# Patient Record
Sex: Female | Born: 1935
Health system: Southern US, Community
[De-identification: ages and names within clinical notes are randomized; demographics above are authoritative.]

## PROBLEM LIST (undated history)

## (undated) DIAGNOSIS — E785 Hyperlipidemia, unspecified: Secondary | ICD-10-CM

## (undated) DIAGNOSIS — E119 Type 2 diabetes mellitus without complications: Secondary | ICD-10-CM

## (undated) DIAGNOSIS — I2510 Atherosclerotic heart disease of native coronary artery without angina pectoris: Secondary | ICD-10-CM

## (undated) DIAGNOSIS — I10 Essential (primary) hypertension: Secondary | ICD-10-CM

## (undated) HISTORY — DX: Hyperlipidemia, unspecified: E78.5

## (undated) HISTORY — PX: CATARACT EXTRACTION: SUR2

## (undated) HISTORY — PX: CAROTID ENDARTERECTOMY: SUR193

## (undated) HISTORY — DX: Type 2 diabetes mellitus without complications: E11.9

## (undated) HISTORY — DX: Essential (primary) hypertension: I10

## (undated) HISTORY — DX: Atherosclerotic heart disease of native coronary artery without angina pectoris: I25.10

## (undated) HISTORY — PX: CORONARY ARTERY BYPASS GRAFT: SHX141

---

## 2004-12-06 ENCOUNTER — Inpatient Hospital Stay (HOSPITAL_BASED_OUTPATIENT_CLINIC_OR_DEPARTMENT_OTHER): Admission: RE | Admit: 2004-12-06 | Discharge: 2004-12-06 | Payer: Self-pay | Admitting: *Deleted

## 2004-12-06 ENCOUNTER — Ambulatory Visit: Payer: Self-pay | Admitting: *Deleted

## 2004-12-23 ENCOUNTER — Inpatient Hospital Stay (HOSPITAL_COMMUNITY): Admission: RE | Admit: 2004-12-23 | Discharge: 2004-12-28 | Payer: Self-pay | Admitting: Cardiothoracic Surgery

## 2006-10-13 IMAGING — CR DG CHEST 1V PORT
1 series · 1 of 1 positions shown · non-contrast
Comparison: none

CLINICAL DATA: Status post CABG; coronary artery disease
 CHEST PORTABLE ONE VIEW:
 AP view of the chest dated 12/26/04 is compared with the study of the previous day.  Since the previous study, the central venous sheath has been removed.  There is some interval improvement in aeration; however, small pleural effusion with atelectasis at the left base persists.  No evidence of pneumothorax is noted.

[view not recorded]
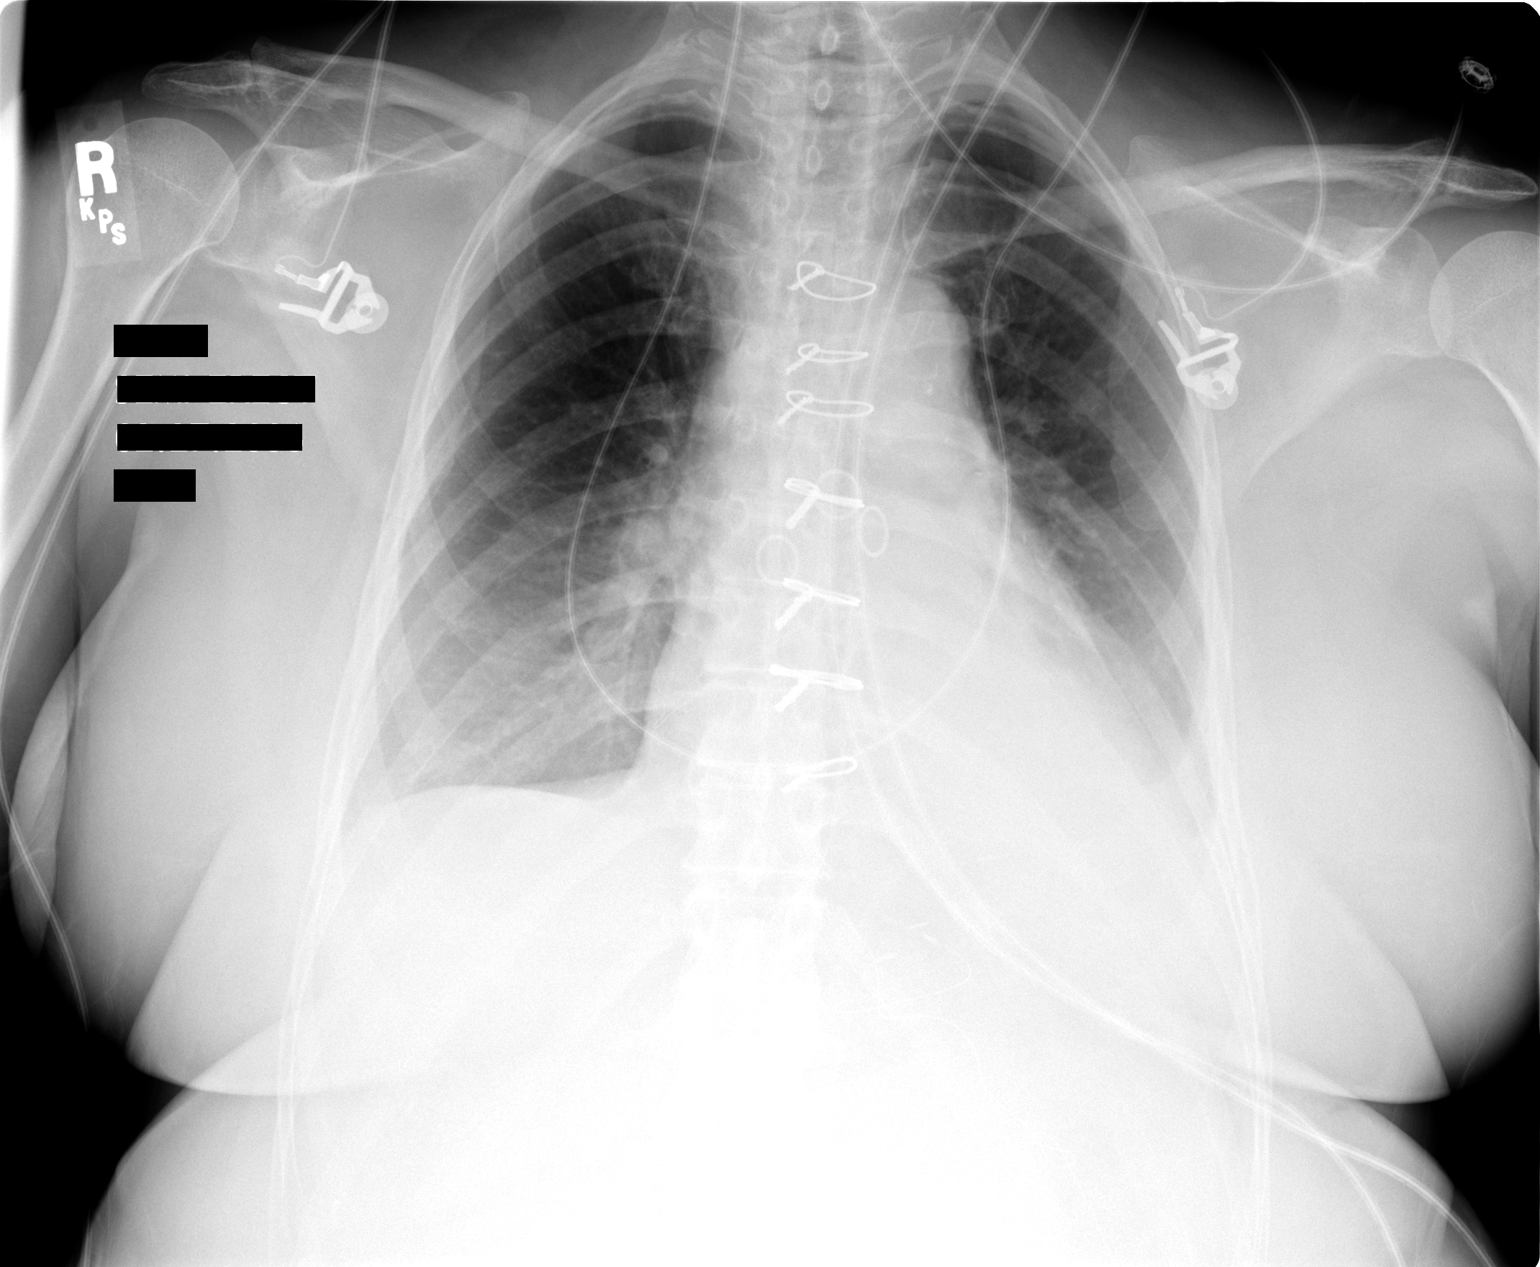

[1 of 1 positions shown; findings below may reference images not displayed]

IMPRESSION: Slight interval improvement; however, there is residual pleural effusion and atelectasis persisting on the left with other discussion as above.

## 2006-10-14 IMAGING — CR DG CHEST 2V
2 series · 2 of 2 positions shown · non-contrast
Comparison: none

CLINICAL DATA: Post-CABG.  Chest soreness. 
 CHEST - TWO VIEW:
 Two views of the chest are compared to a portable chest x-ray from 12/26/04.  Small bilateral pleural effusions remain with basilar atelectasis.  No pneumothorax is seen.  Mild cardiomegaly is stable.  Median sternotomy sutures are noted.

[view not recorded (1 of 2)]
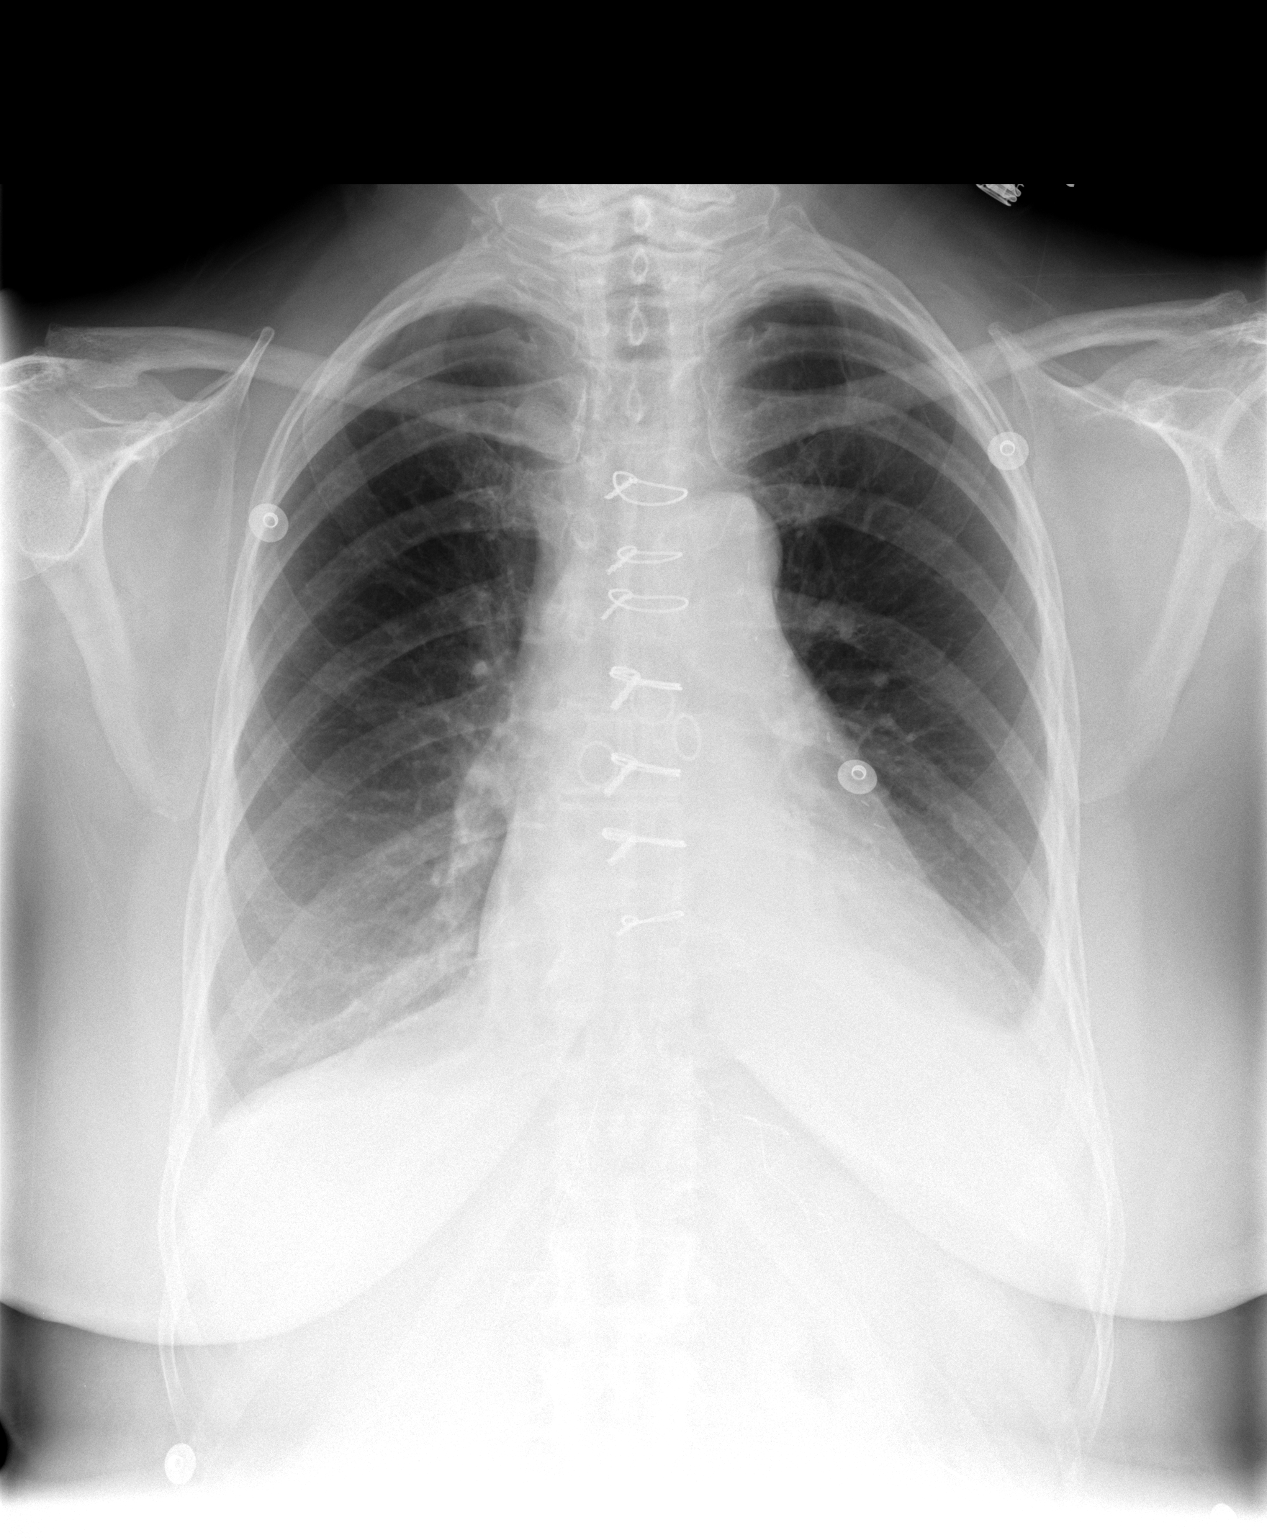

[view not recorded (2 of 2)]
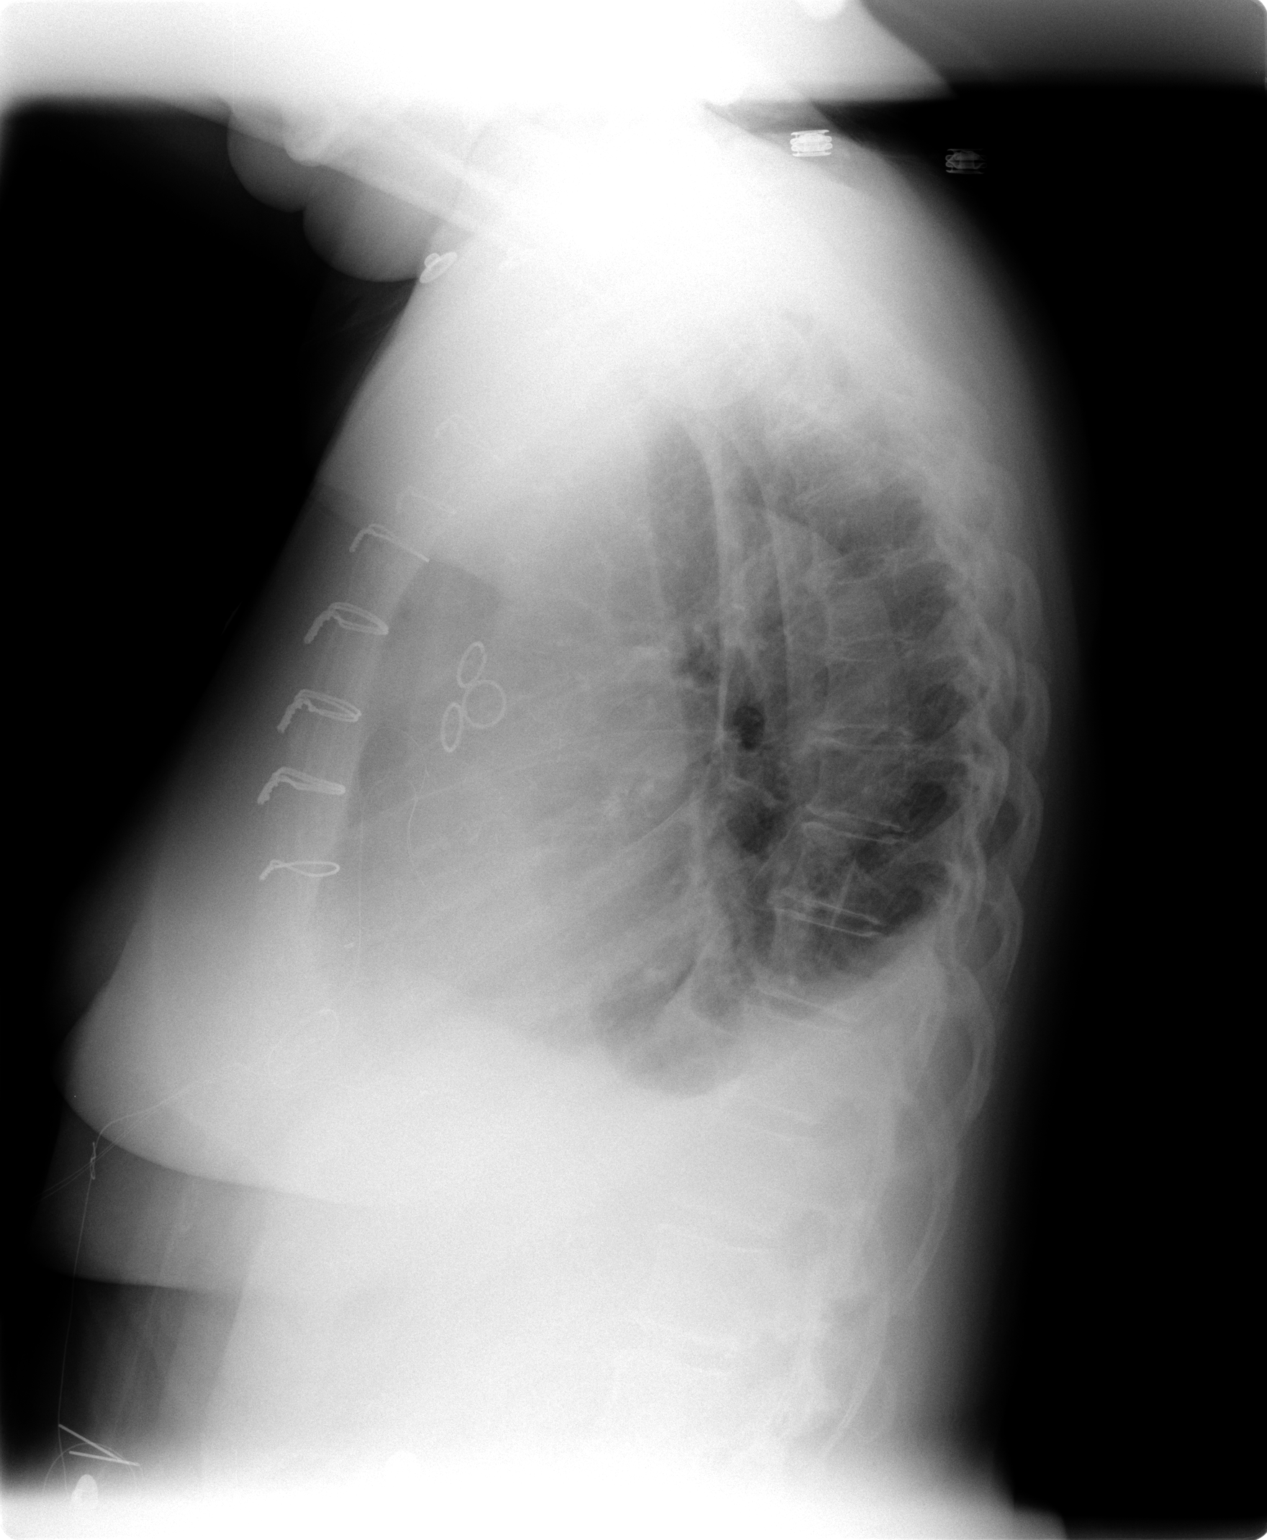

[2 of 2 positions shown; findings below may reference images not displayed]

IMPRESSION: Small bilateral pleural effusions, left greater than right, with basilar atelectasis postoperatively.

## 2007-12-20 ENCOUNTER — Ambulatory Visit (HOSPITAL_COMMUNITY): Admission: RE | Admit: 2007-12-20 | Discharge: 2007-12-20 | Payer: Self-pay | Admitting: Cardiovascular Disease

## 2007-12-20 ENCOUNTER — Ambulatory Visit: Payer: Self-pay | Admitting: Cardiology

## 2010-11-20 ENCOUNTER — Encounter: Payer: Self-pay | Admitting: Cardiothoracic Surgery

## 2011-03-14 NOTE — H&P (Signed)
Angel Gentry, Angel Gentry                 ACCOUNT NO.:  [id number]   MEDICAL RECORD NO.:  [id number]          PATIENT TYPE:  OIB   LOCATION:  2856                         FACILITY:  [facility]   PHYSICIAN:  Arturo Morton. Riley Kill, MD, FACCDATE OF BIRTH:  01/27/1935   DATE OF ADMISSION:  12/20/2007  DATE OF DISCHARGE:                              HISTORY & PHYSICAL   PRIMARY CARDIOLOGIST:  Aundra Dubin. Revankar, M.D. of [facility].   PHYSICIAN PERFORMING CATHETERIZATION:  Arturo Morton. Riley Kill, MD, [facility].   PRIMARY CARE PHYSICIAN:  Dr. Roney Marion.   HISTORY OF PRESENT ILLNESS:  This is a 76 year old Caucasian female with  a known history of CAD, coronary artery bypass grafting of 6 vessels in  February 2006, who was seen by Dr. Tomie China on followup visit secondary  to increased dyspnea on exertion.  The patient underwent a stress test  and had 3-mm horizontal ST inferolateral depression with termination of  test secondary to dyspnea.  The patient recovered well Her nuclear study  revealed no signs of ischemia.  However, secondary to the abnormality of  the stress test, it has been advised by Dr. Tomie China that the patient  have a cardiac catheterization with her known history.  This was  discussed with Dr. Riley Kill by phone by Dr. Tomie China and cardiac  catheterization was planned.   REVIEW OF SYSTEMS:  The patient had complaints of dyspnea on exertion  and shortness of breath.  She also complains of pounding in her ears at  nighttime which is disturbing and difficult to allow her to sleep at  night.  Otherwise, she denies any chest pain.  She denies any dizziness,  diaphoresis, nausea or vomiting.  The shortness of breath occurs with  exertion only.  No PND or orthopnea.   PAST MEDICAL HISTORY:  1. Known coronary artery disease.  2. Dyslipidemia.  3. Essential hypertension.  4. Diabetes mellitus, type 2.  5. Hypothyroidism.   PAST SURGICAL HISTORY:  1. Coronary artery  bypass grafts, 6 vessels.  2. Breast biopsy.  3. D&C.   SOCIAL HISTORY:  She lives near [place] with her husband.  She does not  smoke, does not drink alcohol.   FAMILY HISTORY:  Not significant secondary to age and diagnoses.   CURRENT MEDICATIONS:  1. Actos 15 mg once a day.  2. Simvastatin 40 mg daily.  3. Levothyroxine 88 mcg once a day.  4. Metoprolol 50 mg, one and one half tablets daily.  5. HCTZ 25 mg one half tablet daily.  6. Cozaar 50 mg once a day.  7. Fenofibrate 200 mg caps daily.  8. Plavix 75 mg daily.  9. Ecotrin 325 mg daily.  10.Centrum Silver multivitamin once a day.  11.Calcium with vitamin D, once a day.  12.Nitroglycerin p.r.n.   ALLERGIES:  1. SULFA.  2. RELAFEN.  Both cause rash.   CURRENT LABORATORY:  Hemoglobin 12.7, hematocrit 37, white blood cells  5.7, platelets 185.  Sodium 140, potassium 4.1, chloride 104, CO2 27,  BUN 20, creatinine 1, glucose 102.  EKG revealing a normal sinus  rhythm  with T wave flattening inferolateral with no Q waves seen.  EKG rate of  70 beats per minute,   PHYSICAL EXAMINATION:  VITAL SIGNS:  Blood pressure 142/66, pulse 66,  respirations 20, temperature 97.7, O2 sat 98% on room air.  Weight is  70.9 kg.  HEENT:  Head is normocephalic and atraumatic.  Eyes:  PERRLA.  Mucous  membranes of the mouth and pink and moist.  Tongue is midline.  NECK:  Supple.  There is no JVD.  There are no carotid bruits.  CARDIOVASCULAR:  Regular rate and rhythm with high pitched systolic  murmur auscultated without rubs or gallops.  Pulses are 2 plus and equal  bilaterally radially and dorsalis pedis.  LUNGS:  Clear to auscultation without wheezes, rales, or rhonchi.  ABDOMEN:  Soft, nontender, with 2 plus bowel sounds.  There are no  abdominal bruits.  No tenderness is noted.  EXTREMITIES:  Without clubbing, cyanosis, or edema.  No femoral bruits  are appreciated.  NEUROLOGIC:  Cranial nerves II-XII are grossly intact.    IMPRESSION:  1. Coronary artery disease, status post 6-vessel coronary artery      bypass grafting in 2006.      a.     Recurrent dyspnea on exertion.      b.     Abnormal stress test with inferolateral ST depression of 3-       mm, per Dr. Tomie China.  2. Dyslipidemia.  3. Diabetes.  4. Hypothyroidism.   PLAN:  This is a 76 year old Caucasian female with known history of  coronary artery disease and coronary artery bypass grafting who was seen  by primary cardiologist, Dr. Tomie China in [place] and underwent  Cardiolite stress test revealing abnormal EKG with 3-mm horizontal ST  inferolateral depressions with associated dyspnea.  The  patient is here for cardiac catheterization and re-evaluation of known  coronary artery disease and coronary artery grafts per Dr. Riley Kill.  The  patient appears stable at present and is without pain or shortness of  breath.  She will undergo cardiac catheterization with recommendations  and intervention at Dr. Rosalyn Charters discretion.      Bettey Mare. Lyman Bishop, NP      Arturo Morton. Riley Kill, MD, [facility]  Electronically Signed    KML/MEDQ  D:  12/20/2007  T:  12/20/2007  Job:  [id number]   cc:   Aundra Dubin. Revankar, M.D.  Roney Marion, Dr.

## 2011-03-14 NOTE — Cardiovascular Report (Signed)
Angel Gentry, Angel Gentry                 ACCOUNT NO.:  [id number]   MEDICAL RECORD NO.:  [id number]          PATIENT TYPE:  OIB   LOCATION:  2856                         FACILITY:  [facility]   PHYSICIAN:  Arturo Morton. Riley Kill, MD, FACCDATE OF BIRTH:  May 19, 1935   DATE OF PROCEDURE:  DATE OF DISCHARGE:                            CARDIAC CATHETERIZATION   INDICATIONS:  Angel Gentry is a delightful 76 year old who has undergone  revascularization surgery.  She had follow-up with Dr. Tomie China.  She  had a normal myocardial perfusion study, but significant ST depression  that persisted.  Because of her diabetic status, she was referred for  repeat cardiac catheterization.   PROCEDURE:  1. Left heart catheterization  2. Selective coronary arteriography.  3. Selective left ventriculography.  4. Saphenous vein graft angiography x3.  5. Selective left internal mammary angiography.   DESCRIPTION OF PROCEDURE:  The patient was brought to the cath lab and  prepped and draped in usual fashion.  Through an anterior puncture, the  right femoral artery was easily entered.  The 5-French sheath was  placed.  Views of the left and right coronary arteries were obtained.  All three vein grafts were injected.  Internal mammary was injected.  Central aortic and left ventricular pressures were measured with a  pigtail. Ventriculography was then performed in the RAO projection.  There were no complications.  She tolerated the procedure well and was  taken to the holding area in satisfactory clinical condition.  I took  pictures to her family, and reviewed the findings with her husband.   She was taken then to the day cath area for convalescence.   HEMODYNAMIC DATA:  1. Central aortic pressure 155/67, mean 84.  2. Left ventricular pressure 153/90.  3. On pullback across the aortic valve, there is a fair amount of      ectopy making the pullback unreliable.  On initial crossing, there      did not appear to be a  significant gradient.   ANGIOGRAPHIC DATA:  1. Ventriculography in the RAO projection reveals vigorous global      systolic function without segmental wall motion abnormality.      Ejection fraction is in excess of 65%.  There was ventricular      ectopy, and as a result there was a trace mitral regurgitation,      although on the one sinus beat, there did not appear to be      significant mitral regurgitation.  2. The left main is free of critical disease.  3. The LAD is essentially occluded.  4. The right circumflex artery is occluded.  5. Flow into the diagonal is slow and competitive.  6. The right coronary artery is subtotally occluded in this      midportion.  7. The internal mammary to the distal LAD does appear to be intact.      There is mild irregularity in the internal mammary, but no areas of      high-grade obstruction.  8. The saphenous vein graft to the distal circumflex demonstrates what  appears to be likely an occlusion of the OM insertion.  The OM      itself is probably diffusely diseased and small in caliber.  The      vein graft continues smoothly to the distal circumflex which it      fills nicely.  The distal circumflex then retrograde fills the OM.  9. Saphenous vein graft to the diagonal in multiple views appears to      have a stable insertion site.  It actually was implanted into a      smaller sub-branch, but fills the diagonal system well.  10.A saphenous vein graft to the distal right coronary/posterior      descending artery appears to be intact.  Both the PLA, and the PDA      fill nicely.  The PDA and is seen proximally and is occluded before      the graft insertion, when filling through the native vessel from      the proximal graft.   CONCLUSIONS:  1. Well-preserved left ventricular function.  2. Continued patency of the internal mammary to the LAD.  3. Continued patency of saphenous vein graft to the diagonal.  4. Continued patency of the  saphenous vein graft to the distal right      coronary and posterior descending branch.  5. Continued patency of the saphenous vein graft to the distal      circumflex, which retrograde fills the obtuse marginal.   DISPOSITION:  At the present time, the patient will follow up with Dr.  Tomie China.  She likely can continue to be treated medically.  Overall,  her vein grafts appear well preserved.  The EKG change without perfusion  defect could be from ischemia in the OM territory, which fills  retrograde.  Per Dr. Tomie China, we will discontinue her Plavix at the  present time.      Arturo Morton. Riley Kill, MD, [facility]  Electronically Signed     TDS/MEDQ  D:  12/20/2007  T:  12/21/2007  Job:  [id number]   cc:   Aundra Dubin. Revankar, M.D.  Patient Medical Record

## 2011-03-17 NOTE — Consult Note (Signed)
NAMEBRADIE, Angel Gentry                 ACCOUNT NO.:  [id number]   MEDICAL RECORD NO.:  [id number]          PATIENT TYPE:  INP   LOCATION:                               FACILITY:  [facility]   PHYSICIAN:  Sheliah Plane, MD         DATE OF BIRTH:   DATE OF CONSULTATION:  12/15/2004  DATE OF DISCHARGE:  Oct 03, 1935                                   CONSULTATION   OFFICE CONSULTATION AND HISTORY AND PHYSICAL:   FOLLOW-UP CARDIOLOGIST:  Glean Hess R. Revankar, M.D.   PRIMARY CARE PHYSICIAN:  Dr. Lawana Pai.   REASON FOR CONSULTATION:  Coronary artery disease.   HISTORY OF PRESENT ILLNESS:  The patient is a 76 year old female who  headache a known history of coronary occlusive disease first discovered at  cardiac catheterization in 1994 after an abnormal stress test.  The patient notes that she has chest tightness almost all the time feeling  like a knot in her chest that is not exertionally related;  however, she  does have exertionally-related shortness of breath which she notes has  increased slightly over the past several months.  It is mostly noted when  she climbs a hill or stairs, but because of these symptoms in the light of  the patient with diabetes and known occlusive disease from previous cardiac  catheterization, a stress test was performed on December 06, 2004, which  showed reversible ischemia in the inferior wall and septum.  Ejection  fraction was normal.  She was noted to have poor exercise tolerance.  She  was evaluated by Dr. Tomie China and referred for cardiac catheterization which  was done by Dr. Gerri Spore on December 06, 2004.   The patient denies any resting symptoms.  Cardiac risk factors include  hypertension x20 years, hyperlipidemia x20 years, diabetes for at least one  year.  She does not know her recent hemoglobin A1c.  She is not a smoker,  has a positive family history, and her father head myocardial infarctions  x2.  Also, her sister died at 45 of cancer, has a brother  with heart disease  and diabetes who is still alive.   PAST MEDICAL HISTORY:  In addition to the above is significant for  hypothyroidism.   PAST SURGICAL HISTORY:  Breast biopsies x2 in 1983 and 1986, D&Cs 1998 and  1986, cyst removed from the scalp, heart catheterization in 1994 and again  February of 2006.   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  She does not drink alcohol.   MEDICATIONS:  1.  Actos 15 mg a day.  2.  Potassium 20 mEq three times a day.  3.  Isosorbide 20 mg twice a day.  4.  Triamterene/hydrochlorothiazide 75/50 once a day.  5.  Lipitor 20 mg once a day.  6.  Plendil 5 mg once a day.  7.  Nadolol 40 mg a day.  8.  Levoxyl 88 mcg a day.  9.  Prilosec once a day.  10. Ecotrin 325 mg a day.  11  Calcium  3 a day.  1.  Recently added Nitroquick and alprazolam p.r.n.   DRUG SENSITIVITIES/ALLERGIES:  SULFA which causes a rash and RELAFEN which  causes a rash.   CARDIAC REVIEW OF SYSTEMS:  Positive for chest pain, lower extremity edema,  exertional shortness of breath, denies resting shortness of breath,  palpitations, syncope, orthopnea, or presyncope.   GENERAL REVIEW OF SYSTEMS:  The patient denies any constitutional symptoms,  denies fever or weight loss.  RESPIRATORY:  Mild shortness of breath with  exertion, no hemoptysis.  GASTROINTESTINAL:  Denies any change in bowel  habits, denies any blood in her stool or urine.  NEUROLOGIC:  Denies  amaurosis or TIAs.  MUSCULOSKELETAL:  Denies any joint discomfort or  arthritis.  GENITOURINARY:  Denies hematuria, denies urinary tract  infections.  HEMATOLOGIC:  Denies easy bruisability.  ENDOCRINE.  History of  hypothyroidism.  Denies psychiatric history.  HEENT:  Wears glasses.   PHYSICAL EXAMINATION:  VITAL SIGNS:  Blood pressure 130/72, pulse 75,  respiratory rate 18, O2 saturation 97%.  GENERAL:  The patient is awake and alert, neurologically intact, able to  relate  her history without difficulty.  NECK:  She has no jugular venous distention.  She has no carotid bruits.  LUNGS:  Clear bilaterally.  CARDIAC EXAM:  Regular rate and rhythm without murmur or gallop.  ABDOMINAL EXAM:  Benign with palpable masses or palpable enlargement of the  aorta.  LOWER EXTREMITIES:  With intact saphenous vein at both ankles without  significant edema.   Cardiac catheterization performed on December 06, 2004 by Dr. Gerri Spore:  There is no valve gradient.  Ejection fraction is greater than 60% but with  ectopy.  There is mild mitral regurgitation.  The left main has an  approximately 20% stenosis.  The LAD had a complex bifurcation.  The  diagonal has a 90% lesion.  The circumflex has two normal size obtuse  marginals and an 80% stenosis in the mid-circumflex.  The right coronary  artery is dominant with a long 70-80% stenosis, 75% proximal posterior  descending stenosis.   After reviewing the films and the patient's history, I agree with Dr.  Clarita Crane recommendation to proceed with coronary artery bypass grafting.  The patient's vessels are small, but she has diffuse three-vessel disease,  and I have explained to her that she would most likely benefit from bypass  surgery with the high-grade three-vessel coronary  artery disease.  The risks of the surgery including death, infection,  stroke, myocardial infarction, bleeding, blood transfusion were all  discussed with the patient in detail.  She is willing to proceed.  We will  tentatively make arrangements for surgery on December 22, 2004.      EG/MEDQ  D:  12/15/2004  T:  12/15/2004  Job:  [id number]   cc:   Aundra Dubin. Tomie China, M.D.  [address]  [place]  [place] [address]  Fax: [phone]   Carole Binning, M.D. [facility]

## 2011-03-17 NOTE — Cardiovascular Report (Signed)
NAMEITZEL, MCKIBBIN                 ACCOUNT NO.:  [id number]   MEDICAL RECORD NO.:  [id number]          PATIENT TYPE:  OIB   LOCATION:  6501                         FACILITY:  [facility]   PHYSICIAN:  Carole Binning, M.D. LHCDATE OF BIRTH:  [birth year]   DATE OF PROCEDURE:  12/06/2004  DATE OF DISCHARGE:                              CARDIAC CATHETERIZATION   PROCEDURE PERFORMED:  Left heart catheterization with coronary angiography  and left ventriculography.   INDICATIONS:  Ms. Thorstenson is a 76 year old woman with multiple cardiac risk  factors including diabetes, dyslipidemia, and hypertension.  She has had  symptoms of progressive exertional dyspnea and occasional chest pain.  A  stress nuclear scan performed in [place] was interpreted as revealing  inferior ischemia.  She was therefore referred for cardiac catheterization.   PROCEDURE NOTE:  A 4-French sheath was placed in the right femoral artery.  Coronary angiography was performed with standard Judkins 4-French catheters.  Left ventriculography was performed with an angled pigtail catheter.  Contrast was Omnipaque.  There are no complications.   RESULTS:   HEMODYNAMICS:  1.  Left ventricular pressure 136/10.  2.  Aortic pressure 140/62.  There is no aortic valve gradient.   LEFT VENTRICULOGRAM:  Wall motion is normal.  Ejection fraction estimated at  greater than 60%.  It could not be calculated precisely due to ventricular  ectopy.  There is mild mitral regurgitation present which appears to be  secondary to ventricular ectopy.   CORONARY ARTERIOGRAPHY:  (There is moderate calcification of the coronary  arteries, particularly the right coronary artery).  Left main has a diffuse 20% stenosis.  Left anterior descending artery has complex bifurcational disease with a 90%  stenosis in the proximal vessel extending across the origin of a large first  diagonal branch.  Further down in the mid vessel, there is a tubular 70%  stenosis.  The first diagonal itself has an 80% stenosis at the ostium  followed by a short area of ectasia followed by a 50% stenosis.  This is a  large bifurcating vessel.  Left circumflex gives rise to two normal-sized obtuse marginal branches.  There is an 80% stenosis in the mid left circumflex.  Right coronary is a large dominant vessel.  In the mid vessel there is a  long 80% stenosis extending to the acute margin of the vessel.  The distal  right coronary artery gives rise to a large posterior descending artery and  a large posterolateral branch.  In the posterior descending artery, there is  a diffuse 75% stenosis proximally.   IMPRESSIONS:  1.  Normal left ventricular systolic function.  2.  Complex significant three-vessel coronary artery disease.   PLAN:  The patient will be referred for coronary bypass surgery.      MWP/MEDQ  D:  12/06/2004  T:  12/06/2004  Job:  [id number]   cc:   Dr. Nelia Shi  [facility] R. Revankar, M.D.  [address]  [place]  [place] [address]  Fax: [phone]   Cardiac Cath Lab

## 2011-03-17 NOTE — Cardiovascular Report (Signed)
[name], Angel Gentry                 ACCOUNT NO.:  [id number]   MEDICAL RECORD NO.:  [id number]          PATIENT TYPE:  OIB   LOCATION:  6501                         FACILITY:  [facility]   PHYSICIAN:  Carole Binning, M.D. LHCDATE OF BIRTH:  1935-02-09   DATE OF PROCEDURE:  DATE OF DISCHARGE:                              CARDIAC CATHETERIZATION   Audio too short to transcribe (less than 5 seconds)      MWP/MEDQ  D:  12/06/2004  T:  12/06/2004  Job:  [id number]

## 2011-03-17 NOTE — Discharge Summary (Signed)
Angel Gentry, Angel Gentry                 ACCOUNT NO.:  [id number]   MEDICAL RECORD NO.:  [id number]          PATIENT TYPE:  INP   LOCATION:  2002                         FACILITY:  [facility]   PHYSICIAN:  Sheliah Plane, MD    DATE OF BIRTH:  03-11-1935   DATE OF ADMISSION:  12/23/2004  DATE OF DISCHARGE:  12/27/2004                                 DISCHARGE SUMMARY   ADMISSION DIAGNOSIS:  Coronary artery disease.   PAST MEDICAL HISTORY AND DISCHARGE DIAGNOSES:  1.  Hypertension.  2.  Hyperlipidemia.  3.  Diabetes mellitus type 2.  4.  Hypothyroidism.  5.  Status post breast biopsies in 1983 and 1986.  6.  Status post dilatation and curettage in 1986 and 1998.  7.  Status post cyst removal from the scalp.  8.  Coronary artery disease status post heart catheterizations in 1994, and      in February 2006 status post coronary artery bypass grafting x6.  9.  Postoperative anemia.   ALLERGIES:  1.  SULFA WHICH CAUSES A RASH.  2.  RELAFEN WHICH CAUSES A RASH.   BRIEF HISTORY:  The patient is a 76 year old female with a known history of  coronary occlusive disease that was first discovered at cardiac cath in 1994  after an abnormal stress test.  The patient noted that she experienced chest  tightness almost all the time that felt like a knot in her chest, it was not  exertionally related, however she did experience dyspnea on exertion which  she noted to increase over the past several months prior to admission.  The  patient was taken for a repeat cardiac catheterization on December 06, 2004  which showed reversible ischemia in the inferior wall and septum with a  normal ejection fraction.  She was noted to have poor exercise tolerance and  was therefore referred from Dr. Tomie China to Dr. Gerri Spore for cardiac  catheterization which was also performed on December 06, 2004.  This revealed  severe three-vessel coronary artery disease.  She was then evaluated by Dr.  Sheliah Plane regarding  surgical revascularization.  After review of the  patient and her films it was his recommendation that the patient proceed  with coronary artery bypass graft surgery.   HOSPITAL COURSE:  The patient was admitted and taken to the OR on December 23, 2004 for coronary artery bypass grafting x6.  The left internal mammary  artery was grafted to the LAD, saphenous vein as grafted to the diagonal,  saphenous vein was grafted in sequence to the OM1 and OM2, and saphenous  vein was grafted in sequence from the RCA to the distal PDA.  Endoscopic  vessel harvesting was performed on the right lower extremity.  The patient  tolerated the procedure well and was hemodynamically stable immediately  postoperatively.  The patient was extubated without complication and woke up  from anesthesia neurologically intact.  The patient was transferred from the  OR to the SICU in stable condition.   The patients postoperative course has progressed as expected.  On postop day  one she was  without complaint.  She was afebrile with stable vital signs and  was A paced at 84.  Her physical exam was within normal limits.  Invasive  lines and chest tubes were discontinued in a routine manner and she  tolerated this well.  The patient began ambulation with cardiac rehab on  postoperative day three and she has increased her tolerance to a  satisfactory manner at this time.   On postoperative day four the patient was noted to be anemic with a  hemoglobin of 8.9 and hematocrit 25.8.  She was asymptomatic and iron and  folic acid were added to her medications.  This is currently being  monitored.  Also on postoperative day four the patient was again without  complaint.  She was afebrile with stable vital signs including a blood  pressure of 106/59, heart rate 88, and O2 saturation 96% on room air.  The  patient was maintaining a normal sinus rhythm with occasional PACs.  She  continues to be volume overloaded and has been  diuresed accordingly  postoperatively and will continue to be diuresed as an outpatient.  On  physical exam cardiac is regular rate and rhythm.  The lungs reveal  decreased breath sounds in the bilateral bases.  The abdomen is benign.  The  incisions are clean, dry and intact and there is 1+ pitting edema present in  the right lower extremity.  The patients diabetes mellitus has been  controlled quite well postoperatively.  She has been maintained on her home  dose of Actos and was given Lantus as well as sliding-scale insulin as  needed.  The patient will be discharged on her home dose of Actos and will  be instructed to contact her primary care physician for close followup of  her blood sugars after her return home.  The patient is in stable condition  at this time, and she will be ready for discharge within the next 1-2 days,  pending morning round reevaluation.   LABS:  CBC and BMP on December 27, 2004 -- white count 7.6, hemoglobin 8.9,  hematocrit 25.8, platelets 135, sodium 135, potassium 3.8, BUN 13,  creatinine 0.7, glucose 87.  CBGs on December 27, 2004 -- 139 and 145.  Hemoglobin A1c on December 21, 2004 -- 6.1.   CONDITION ON DISCHARGE:  Improved.   DISCHARGE MEDICATIONS:  1.  Aspirin 325 mg p.o. daily.  2.  Toprol XL 50 mg daily.  3.  Lipitor 20 mg daily.  4.  Niferex 150 mg daily.  5.  Folic acid 1 mg daily.  6.  Actos 15 mg daily.  7.  Levoxyl 88 mcg daily.  8.  Lasix 20 mg daily x7 days.  9.  K-Dur 20 mEq daily x7 days.  10. Tylenol 1-2 p.o. q.4-6 hours p.r.n. pain.   ACTIVITY:  No driving, no lifting more than 10 pounds, and the patient is to  continue daily breathing and walking exercises.   DIET:  Low salt, low fat and carbohydrate modified, medium calorie.   WOUND CARE:  The patient may shower daily and clean her incisions with soap  and water.  If wound problems arise, the patient is to contact the [facility]  office.  FOLLOWUP APPOINTMENT:  1.  Dr. Tomie China.   The patient should contact his office for an appointment      two weeks after discharge.  2.  Dr. Tyrone Sage January 19, 2005 at 12:20 p.m.  3.  The patient should go to [facility] on January 19, 2005      at 11:20 a.m. to have a chest x-ray      taken which she will bring with her to the appointment with Dr.      Tyrone Sage.  4.  Dr. Rutha Bouchard.  The patient will be instructed to contact his office for a      followup appointment regarding her diabetes and any medication      adjustments that are necessary after her discharge.      AY/MEDQ  D:  12/27/2004  T:  12/27/2004  Job:  [id number]   cc:   Sheliah Plane, MD  [address]  [place]  [place] [address]  Email: [email or web address]   Rajan R. Revankar, M.D.  [address]  [place]  [place] [address]  Fax: [phone]   Rutha Bouchard, MD

## 2011-03-17 NOTE — Op Note (Signed)
Angel Gentry, Angel Gentry                 ACCOUNT NO.:  [id number]   MEDICAL RECORD NO.:  [id number]          PATIENT TYPE:  INP   LOCATION:  2304                         FACILITY:  [facility]   PHYSICIAN:  Sheliah Plane, MD    DATE OF BIRTH:  Aug 09, 1935   DATE OF PROCEDURE:  12/23/2004  DATE OF DISCHARGE:                                 OPERATIVE REPORT   PREOPERATIVE DIAGNOSIS:  Coronary occlusive disease.   POSTOPERATIVE DIAGNOSIS:  Coronary occlusive disease.   SURGICAL PROCEDURE:  Coronary artery bypass grafting x6 with a left internal  mammary artery to the left anterior descending coronary artery, sequential  reverse saphenous vein graft to the obtuse marginal 1 and circumflex,  sequential reverse saphenous vein graft to the distal right coronary artery  and distal posterior descending coronary artery with right thigh and right  calf Endovein harvesting.   SURGEON:  Sheliah Plane, M.D.   FIRST ASSISTANT:  Evelene Croon, M.D.   SECOND ASSISTANT:  Pecola Leisure, P.A.   BRIEF HISTORY:  The patient is a 76 year old diabetic female who presented  with increasing anginal symptoms.  She was evaluated by Carole Binning,  M.D., and cardiac catheterization was performed.  This demonstrated a  complex 80% lesion of the LAD and diagonal, ostial circumflex lesion of 60-  70%, 90% _distal circumflex lesion, 70-80% mid-right lesion, 70-80% proximal  posterior descending lesion.  Overall, ventricular function was preserved.  Because of the patient's significant three-vessel coronary artery disease  and diabetic status, coronary artery bypass grafting was recommended to the  patient tho agreed and signed informed consent.   DESCRIPTION OF THE PROCEDURE:  With Swan-Ganz and arterial line monitors in  place, the patient underwent general endotracheal anesthesia without  incident.  The skin of the chest and groins was prepped with Betadine and  draped in the usual sterile manner.  Using the  [employer] Endovein harvesting  system, vein was harvested from the right thigh into the mid-portion of the  right calf.  The vein was of good quality and caliber.  A median sternotomy  was performed.  The left internal mammary artery was dissected down as a  pedicle graft.  The distal artery was divided, and it had good free flow.  The pericardium was opened.  Overall, ventricular function appeared  preserved.  The patient was systemically heparinized.  The ascending aorta  and the right atrium were cannulated.  An aortic root vent cardioplegia  needle was introduced into the ascending aorta.  The patient was placed on  cardiopulmonary bypass at 2.4/LPM per meter squared.  Sites of anastomosis  were selected and dissected out of the epicardium.  It should be noted that  the patient's distal vessels were uniformly small, especially the LAD,  diagonal, OM1, and distal circumflex.  The distal right was a slightly  larger vessel.  The posterior descending was small.  The patient's body  temperature was cooled to 30 degrees.  The aortic cross-clamp was applied,  and 500 cc of cold blood cardioplegia was administered with rapid diastolic  arrest of the heart.  Myocardial  septal temperature was monitored throughout  the cross-clamp.  Attention was turned first to the distal right coronary  artery which was opened and admitted a 1.5-mm probe. There was some distal  thickening of the vessel.  Using a longitudinal side-to-side anastomosis,  this was carried out with a segment of reverse saphenous vein graft.  The  distal extent of the same vein was then carried to the mid-portion of the  posterior descending coronary artery.  A 1.5-mm probe passed distally but  not proximally where the proximal posterior descending coronary artery was  stenotic.  Using a running 7-0 Prolene, a distal anastomosis was performed.  Attention was then turned to the left lateral aspect of the heart.  The  first obtuse  marginal and distal circumflex were both intramyocardial  vessels.  The first obtuse marginal was located.  It was a small vessel over  1 mm in size.  Using a diamond-type side-to-side, anastomosis was carried  out with a running 8-0 Prolene.  The distal extent of the same vein was then  carried onto the also small distal circumflex vessel just slightly over 1 mm  in size.  Using a running 8-0 Prolene, a distal anastomosis was performed.  Additional cold blood cardioplegia was administered down the vein grafts.  Attention was then turned to the diagonal coronary artery which was opened.  It was slightly larger than the circumflex vessels, approximately 1.3 mm in  size.  Using a running 7-0 Prolene, a distal anastomosis was performed with  a segment of reverse saphenous vein graft.  Attention was then turned to the  mid-left anterior descending coronary artery.  This vessel was also small  only admitting a 1-mm probe distally.  Using a running 8-0 Prolene, the left  internal mammary artery was anastomosed to the left anterior descending  coronary artery.  With release of Edwards Bulldog on the mammary artery,  there was appropriate rise in myocardial septal temperature.  The aortic  cross-clamp was removed with a total cross-clamp time of 87 minutes.  The  patient required electrodefibrillation returning to a sinus rhythm.  Partial  occlusion clamp was placed on the ascending aorta.  Three punch aortotomies  were performed, and each of the three vein grafts were anastomosed to the  ascending aorta.  Air was evacuated from the grafts, and the partial  occlusion clamp was removed.  The sites of the anastomoses were inspected  and were free of bleeding.  The patient was then ventilated and weaned from  cardiopulmonary bypass without difficulty.  She remained hemodynamically  stable and was decannulated in the usual fashion.  Protamine sulfate was administered.  With the operative field  hemostatic, two atrial and two  ventricular pacing wires were applied.  Graft markers were applied.  A left  pleural tube and two mediastinal tubes were left in place.  The sternum was  closed with #6 stainless steel wire, fascia closed with interrupted 0  Vicryl, running 4-0 Vicryl in the subcutaneous tissue, and 4-0 subcuticular  stitch in the skin edges.  Dressings were applied.  Sponge and needle count  was reported as correct.  Total pump time was 154 minutes.      EG/MEDQ  D:  12/24/2004  T:  12/25/2004  Job:  [id number]   cc:   Carole Binning, M.D. [facility]

## 2011-03-17 NOTE — H&P (Signed)
Angel Gentry, Angel Gentry                 ACCOUNT NO.:  [id number]   MEDICAL RECORD NO.:  [id number]          PATIENT TYPE:  OIB   LOCATION:  6501                         FACILITY:  [facility]   PHYSICIAN:  Carole Binning, M.D. LHCDATE OF BIRTH:  03/06/1935   DATE OF ADMISSION:  12/06/2004  DATE OF DISCHARGE:                                HISTORY & PHYSICAL   PRIMARY CARE PHYSICIAN:  Dr. Lawana Pai.   PRIMARY CARDIOLOGIST:  Aundra Dubin. Revankar, M.D.   CHIEF COMPLAINT:  1.  Angina.  2.  Shortness of breath.  3.  Abnormal Cardiolite.   HISTORY OF PRESENT ILLNESS:  Angel Gentry is a 76 year old female with a  history of nonobstructive coronary artery disease by catheterization in  1994.  Angel Gentry was evaluated by Dr. Lawana Pai for shortness of breath on  exertion.  Dr. Lawana Pai arranged a stress test which was performed on December 01, 2004.  It showed a reversible defect in the inferior wall and septum,  which may be due to ischemia.  Her EF was normal.  She was noted to have  poor exercise tolerance.  She was referred to Dr. Tomie China, who evaluated  her and felt that cardiac catheterization was indicated.  She is here today  for the procedure.   Angel Gentry reports no resting symptoms.  She occasionally experience chest  tightness when she is under stress.  This is not the same as the shortness  of breath she experiences when she walking up hills or up steps.  The  shortness of breath is not associated with any chest pain and it resolves  with rest.  She is not having that today.   PAST MEDICAL HISTORY:  1.  Status post catheterization in 1994 with nonobstructive disease.  2.  Hypertension.  3.  Diabetes.  4.  Dyslipidemia.  5.  Hypothyroidism.   PAST SURGICAL HISTORY:  1.  Cardiac catheterization.  2.  Two breast biopsies.  3.  Two D&Cs.  4.  Scalp cyst removed.   FAMILY HISTORY:  Her mother died at age 31 with a TIA and her father died at  age 9 with an MI and CHF.  She has a brother,  age 57, who has had an MI.   SOCIAL HISTORY:  She is married and lives in [place], [place], with  her husband.  She has no history of smoking, alcohol or drug use.  She works  in the [employer] and says that her job has periods of  high stress.   ALLERGIES:  1.  SULFA.  2.  RELAFEN.   MEDICATIONS:  1.  Lipitor 20 mg q.h.s.  2.  Plendil 5 mg daily.  3.  K-Dur 20 mEq t.i.d.  4.  Maxzide daily.  5.  Isosorbide 20 mg b.i.d.  6.  Ecotrin daily.  7.  Actos 15 mg daily.  8.  Nadolol 40 mg daily.  9.  Levoxyl 88 mcg daily.  10. Prilosec OTC daily.  11. Calcium with vitamin D three tablets daily.   REVIEW OF SYSTEMS:  Significant for  dyspnea on exertion as described above.  She has not had any melena and no hemoptysis or hematemesis.  Her reflux  symptoms are well controlled on the Prilosec.  She has had no recent fever  or chills.  The review of systems is otherwise negative.   PHYSICAL EXAMINATION:  VITAL SIGNS:  She is afebrile.  Blood pressure  126/63, heart rate 62, respiratory rate 18, O2 saturation 97% on room air.  GENERAL APPEARANCE:  She is a well-developed, well-nourished, middle-aged,  white female in no acute distress.  HEENT:  Her head is normocephalic and atraumatic.  The pupils are equal,  round and reactive to light and accommodation.  Extraocular movements are  intact.  Sclerae are clear.  NECK:  There is no JVD, no thyromegaly and no carotid bruits are  appreciated.  CARDIOVASCULAR:  Her heart is regular in rate and rhythm with no significant  murmur, rub or gallop appreciated.  CHEST:  Clear to auscultation bilaterally.  ABDOMEN:  Soft and nontender with active bowel sounds.  No  hepatosplenomegaly by palpation.  SKIN:  No rashes or lesions are noted.  EXTREMITIES:  There is no cyanosis, clubbing or edema.  She has 2+ distal  pulses in all four extremities.  No femoral bruits are appreciated.  MUSCULOSKELETAL:  There is no joint  deformity or effusions noted.  NEUROLOGIC:  She is alert and oriented.  Cranial nerves II-XII grossly  intact.   ASSESSMENT AND PLAN:  1.  Abnormal Cardiolite:  The patient is here today for cardiac      catheterization.  Further evaluation and      intervention will depend on the results of the catheterization.  2.  The patient is otherwise stable.  Will be continued on her home      medications.   Dr. Gerri Spore saw the patient and determined the plan of care.      RB/MEDQ  D:  12/06/2004  T:  12/06/2004  Job:  [id number]

## 2011-07-21 LAB — BASIC METABOLIC PANEL
BUN: 21
CO2: 24
Calcium: 9.8
Chloride: 102
Creatinine, Ser: 1.05
GFR calc Af Amer: >60

## 2011-07-21 LAB — CBC
MCHC: 34.2
MCV: 89.7
Platelets: 185
RBC: 4.13
RDW: 13.6

## 2011-11-07 DIAGNOSIS — R05 Cough: Secondary | ICD-10-CM | POA: Diagnosis not present

## 2012-01-03 DIAGNOSIS — M899 Disorder of bone, unspecified: Secondary | ICD-10-CM | POA: Diagnosis not present

## 2012-01-03 DIAGNOSIS — I10 Essential (primary) hypertension: Secondary | ICD-10-CM | POA: Diagnosis not present

## 2012-01-03 DIAGNOSIS — E119 Type 2 diabetes mellitus without complications: Secondary | ICD-10-CM | POA: Diagnosis not present

## 2012-01-03 DIAGNOSIS — E039 Hypothyroidism, unspecified: Secondary | ICD-10-CM | POA: Diagnosis not present

## 2012-01-03 DIAGNOSIS — Z780 Asymptomatic menopausal state: Secondary | ICD-10-CM | POA: Diagnosis not present

## 2012-01-03 DIAGNOSIS — E7800 Pure hypercholesterolemia, unspecified: Secondary | ICD-10-CM | POA: Diagnosis not present

## 2012-03-13 DIAGNOSIS — E119 Type 2 diabetes mellitus without complications: Secondary | ICD-10-CM | POA: Diagnosis not present

## 2012-03-13 DIAGNOSIS — I10 Essential (primary) hypertension: Secondary | ICD-10-CM | POA: Diagnosis not present

## 2012-03-13 DIAGNOSIS — E785 Hyperlipidemia, unspecified: Secondary | ICD-10-CM | POA: Diagnosis not present

## 2012-03-13 DIAGNOSIS — I2510 Atherosclerotic heart disease of native coronary artery without angina pectoris: Secondary | ICD-10-CM | POA: Diagnosis not present

## 2012-07-04 DIAGNOSIS — L821 Other seborrheic keratosis: Secondary | ICD-10-CM | POA: Diagnosis not present

## 2012-07-04 DIAGNOSIS — L570 Actinic keratosis: Secondary | ICD-10-CM | POA: Diagnosis not present

## 2012-07-08 DIAGNOSIS — M779 Enthesopathy, unspecified: Secondary | ICD-10-CM | POA: Diagnosis not present

## 2012-07-09 DIAGNOSIS — Z23 Encounter for immunization: Secondary | ICD-10-CM | POA: Diagnosis not present

## 2012-07-09 DIAGNOSIS — E119 Type 2 diabetes mellitus without complications: Secondary | ICD-10-CM | POA: Diagnosis not present

## 2012-07-09 DIAGNOSIS — Z79899 Other long term (current) drug therapy: Secondary | ICD-10-CM | POA: Diagnosis not present

## 2012-07-09 DIAGNOSIS — E039 Hypothyroidism, unspecified: Secondary | ICD-10-CM | POA: Diagnosis not present

## 2012-07-26 DIAGNOSIS — Z1231 Encounter for screening mammogram for malignant neoplasm of breast: Secondary | ICD-10-CM | POA: Diagnosis not present

## 2012-09-12 DIAGNOSIS — E119 Type 2 diabetes mellitus without complications: Secondary | ICD-10-CM | POA: Diagnosis not present

## 2012-09-12 DIAGNOSIS — I10 Essential (primary) hypertension: Secondary | ICD-10-CM | POA: Diagnosis not present

## 2012-09-12 DIAGNOSIS — I2510 Atherosclerotic heart disease of native coronary artery without angina pectoris: Secondary | ICD-10-CM | POA: Diagnosis not present

## 2012-09-12 DIAGNOSIS — E785 Hyperlipidemia, unspecified: Secondary | ICD-10-CM | POA: Diagnosis not present

## 2012-12-18 DIAGNOSIS — I10 Essential (primary) hypertension: Secondary | ICD-10-CM | POA: Diagnosis not present

## 2012-12-18 DIAGNOSIS — E039 Hypothyroidism, unspecified: Secondary | ICD-10-CM | POA: Diagnosis not present

## 2012-12-18 DIAGNOSIS — E7800 Pure hypercholesterolemia, unspecified: Secondary | ICD-10-CM | POA: Diagnosis not present

## 2012-12-18 DIAGNOSIS — E119 Type 2 diabetes mellitus without complications: Secondary | ICD-10-CM | POA: Diagnosis not present

## 2013-03-12 DIAGNOSIS — E785 Hyperlipidemia, unspecified: Secondary | ICD-10-CM | POA: Diagnosis not present

## 2013-03-12 DIAGNOSIS — Z79899 Other long term (current) drug therapy: Secondary | ICD-10-CM | POA: Diagnosis not present

## 2013-03-12 DIAGNOSIS — I2510 Atherosclerotic heart disease of native coronary artery without angina pectoris: Secondary | ICD-10-CM | POA: Diagnosis not present

## 2013-03-12 DIAGNOSIS — E119 Type 2 diabetes mellitus without complications: Secondary | ICD-10-CM | POA: Diagnosis not present

## 2013-03-12 DIAGNOSIS — I10 Essential (primary) hypertension: Secondary | ICD-10-CM | POA: Diagnosis not present

## 2013-06-17 DIAGNOSIS — E119 Type 2 diabetes mellitus without complications: Secondary | ICD-10-CM | POA: Diagnosis not present

## 2013-06-17 DIAGNOSIS — E039 Hypothyroidism, unspecified: Secondary | ICD-10-CM | POA: Diagnosis not present

## 2013-06-20 DIAGNOSIS — E7800 Pure hypercholesterolemia, unspecified: Secondary | ICD-10-CM | POA: Diagnosis not present

## 2013-06-20 DIAGNOSIS — E119 Type 2 diabetes mellitus without complications: Secondary | ICD-10-CM | POA: Diagnosis not present

## 2013-06-20 DIAGNOSIS — I10 Essential (primary) hypertension: Secondary | ICD-10-CM | POA: Diagnosis not present

## 2013-07-11 DIAGNOSIS — Z23 Encounter for immunization: Secondary | ICD-10-CM | POA: Diagnosis not present

## 2013-07-28 DIAGNOSIS — Z1231 Encounter for screening mammogram for malignant neoplasm of breast: Secondary | ICD-10-CM | POA: Diagnosis not present

## 2013-08-06 DIAGNOSIS — R928 Other abnormal and inconclusive findings on diagnostic imaging of breast: Secondary | ICD-10-CM | POA: Diagnosis not present

## 2013-08-06 DIAGNOSIS — N63 Unspecified lump in unspecified breast: Secondary | ICD-10-CM | POA: Diagnosis not present

## 2013-08-13 DIAGNOSIS — N6089 Other benign mammary dysplasias of unspecified breast: Secondary | ICD-10-CM | POA: Diagnosis not present

## 2013-08-13 DIAGNOSIS — R920 Mammographic microcalcification found on diagnostic imaging of breast: Secondary | ICD-10-CM | POA: Diagnosis not present

## 2013-08-13 DIAGNOSIS — R928 Other abnormal and inconclusive findings on diagnostic imaging of breast: Secondary | ICD-10-CM | POA: Diagnosis not present

## 2013-08-13 DIAGNOSIS — N63 Unspecified lump in unspecified breast: Secondary | ICD-10-CM | POA: Diagnosis not present

## 2013-08-26 DIAGNOSIS — R928 Other abnormal and inconclusive findings on diagnostic imaging of breast: Secondary | ICD-10-CM | POA: Diagnosis not present

## 2013-08-26 DIAGNOSIS — R920 Mammographic microcalcification found on diagnostic imaging of breast: Secondary | ICD-10-CM | POA: Diagnosis not present

## 2013-08-26 DIAGNOSIS — N63 Unspecified lump in unspecified breast: Secondary | ICD-10-CM | POA: Diagnosis not present

## 2013-09-03 DIAGNOSIS — D0590 Unspecified type of carcinoma in situ of unspecified breast: Secondary | ICD-10-CM | POA: Diagnosis not present

## 2013-09-03 DIAGNOSIS — C50519 Malignant neoplasm of lower-outer quadrant of unspecified female breast: Secondary | ICD-10-CM | POA: Diagnosis not present

## 2013-09-03 DIAGNOSIS — R9431 Abnormal electrocardiogram [ECG] [EKG]: Secondary | ICD-10-CM | POA: Diagnosis not present

## 2013-09-03 DIAGNOSIS — Z7982 Long term (current) use of aspirin: Secondary | ICD-10-CM | POA: Diagnosis not present

## 2013-09-03 DIAGNOSIS — Z79899 Other long term (current) drug therapy: Secondary | ICD-10-CM | POA: Diagnosis not present

## 2013-09-03 DIAGNOSIS — I2510 Atherosclerotic heart disease of native coronary artery without angina pectoris: Secondary | ICD-10-CM | POA: Diagnosis not present

## 2013-09-03 DIAGNOSIS — N6489 Other specified disorders of breast: Secondary | ICD-10-CM | POA: Diagnosis not present

## 2013-09-03 DIAGNOSIS — C50919 Malignant neoplasm of unspecified site of unspecified female breast: Secondary | ICD-10-CM | POA: Diagnosis not present

## 2013-09-03 DIAGNOSIS — K219 Gastro-esophageal reflux disease without esophagitis: Secondary | ICD-10-CM | POA: Diagnosis not present

## 2013-09-03 DIAGNOSIS — E119 Type 2 diabetes mellitus without complications: Secondary | ICD-10-CM | POA: Diagnosis not present

## 2013-09-03 DIAGNOSIS — N63 Unspecified lump in unspecified breast: Secondary | ICD-10-CM | POA: Diagnosis not present

## 2013-09-03 DIAGNOSIS — I10 Essential (primary) hypertension: Secondary | ICD-10-CM | POA: Diagnosis not present

## 2013-09-16 DIAGNOSIS — M999 Biomechanical lesion, unspecified: Secondary | ICD-10-CM | POA: Diagnosis not present

## 2013-09-17 DIAGNOSIS — M999 Biomechanical lesion, unspecified: Secondary | ICD-10-CM | POA: Diagnosis not present

## 2013-09-18 DIAGNOSIS — M999 Biomechanical lesion, unspecified: Secondary | ICD-10-CM | POA: Diagnosis not present

## 2013-09-19 DIAGNOSIS — C50419 Malignant neoplasm of upper-outer quadrant of unspecified female breast: Secondary | ICD-10-CM | POA: Diagnosis not present

## 2013-09-22 DIAGNOSIS — I10 Essential (primary) hypertension: Secondary | ICD-10-CM | POA: Diagnosis not present

## 2013-09-22 DIAGNOSIS — I259 Chronic ischemic heart disease, unspecified: Secondary | ICD-10-CM | POA: Diagnosis not present

## 2013-09-22 DIAGNOSIS — E119 Type 2 diabetes mellitus without complications: Secondary | ICD-10-CM | POA: Diagnosis not present

## 2013-09-22 DIAGNOSIS — E785 Hyperlipidemia, unspecified: Secondary | ICD-10-CM | POA: Diagnosis not present

## 2013-09-22 DIAGNOSIS — M999 Biomechanical lesion, unspecified: Secondary | ICD-10-CM | POA: Diagnosis not present

## 2013-09-23 DIAGNOSIS — M999 Biomechanical lesion, unspecified: Secondary | ICD-10-CM | POA: Diagnosis not present

## 2013-09-24 DIAGNOSIS — M999 Biomechanical lesion, unspecified: Secondary | ICD-10-CM | POA: Diagnosis not present

## 2013-09-29 DIAGNOSIS — M999 Biomechanical lesion, unspecified: Secondary | ICD-10-CM | POA: Diagnosis not present

## 2013-09-30 DIAGNOSIS — M999 Biomechanical lesion, unspecified: Secondary | ICD-10-CM | POA: Diagnosis not present

## 2013-10-01 DIAGNOSIS — M999 Biomechanical lesion, unspecified: Secondary | ICD-10-CM | POA: Diagnosis not present

## 2013-10-06 DIAGNOSIS — M999 Biomechanical lesion, unspecified: Secondary | ICD-10-CM | POA: Diagnosis not present

## 2013-10-07 DIAGNOSIS — M999 Biomechanical lesion, unspecified: Secondary | ICD-10-CM | POA: Diagnosis not present

## 2013-10-09 DIAGNOSIS — M999 Biomechanical lesion, unspecified: Secondary | ICD-10-CM | POA: Diagnosis not present

## 2013-10-13 DIAGNOSIS — M999 Biomechanical lesion, unspecified: Secondary | ICD-10-CM | POA: Diagnosis not present

## 2013-10-14 DIAGNOSIS — M999 Biomechanical lesion, unspecified: Secondary | ICD-10-CM | POA: Diagnosis not present

## 2013-10-16 DIAGNOSIS — M999 Biomechanical lesion, unspecified: Secondary | ICD-10-CM | POA: Diagnosis not present

## 2013-10-20 DIAGNOSIS — M999 Biomechanical lesion, unspecified: Secondary | ICD-10-CM | POA: Diagnosis not present

## 2013-10-21 DIAGNOSIS — M999 Biomechanical lesion, unspecified: Secondary | ICD-10-CM | POA: Diagnosis not present

## 2013-10-27 DIAGNOSIS — M999 Biomechanical lesion, unspecified: Secondary | ICD-10-CM | POA: Diagnosis not present

## 2013-10-29 DIAGNOSIS — M999 Biomechanical lesion, unspecified: Secondary | ICD-10-CM | POA: Diagnosis not present

## 2013-11-03 DIAGNOSIS — M999 Biomechanical lesion, unspecified: Secondary | ICD-10-CM | POA: Diagnosis not present

## 2013-11-03 DIAGNOSIS — S339XXA Sprain of unspecified parts of lumbar spine and pelvis, initial encounter: Secondary | ICD-10-CM | POA: Diagnosis not present

## 2013-11-06 DIAGNOSIS — M999 Biomechanical lesion, unspecified: Secondary | ICD-10-CM | POA: Diagnosis not present

## 2013-11-06 DIAGNOSIS — S339XXA Sprain of unspecified parts of lumbar spine and pelvis, initial encounter: Secondary | ICD-10-CM | POA: Diagnosis not present

## 2013-11-11 DIAGNOSIS — Z79899 Other long term (current) drug therapy: Secondary | ICD-10-CM | POA: Diagnosis not present

## 2013-11-11 DIAGNOSIS — Z951 Presence of aortocoronary bypass graft: Secondary | ICD-10-CM | POA: Diagnosis not present

## 2013-11-11 DIAGNOSIS — I2510 Atherosclerotic heart disease of native coronary artery without angina pectoris: Secondary | ICD-10-CM | POA: Diagnosis not present

## 2013-11-11 DIAGNOSIS — C50419 Malignant neoplasm of upper-outer quadrant of unspecified female breast: Secondary | ICD-10-CM | POA: Diagnosis not present

## 2013-11-11 DIAGNOSIS — E119 Type 2 diabetes mellitus without complications: Secondary | ICD-10-CM | POA: Diagnosis not present

## 2013-11-11 DIAGNOSIS — E039 Hypothyroidism, unspecified: Secondary | ICD-10-CM | POA: Diagnosis not present

## 2013-11-11 DIAGNOSIS — Z7982 Long term (current) use of aspirin: Secondary | ICD-10-CM | POA: Diagnosis not present

## 2013-11-11 DIAGNOSIS — E785 Hyperlipidemia, unspecified: Secondary | ICD-10-CM | POA: Diagnosis not present

## 2013-11-13 DIAGNOSIS — M999 Biomechanical lesion, unspecified: Secondary | ICD-10-CM | POA: Diagnosis not present

## 2013-11-13 DIAGNOSIS — S339XXA Sprain of unspecified parts of lumbar spine and pelvis, initial encounter: Secondary | ICD-10-CM | POA: Diagnosis not present

## 2013-11-14 DIAGNOSIS — Z510 Encounter for antineoplastic radiation therapy: Secondary | ICD-10-CM | POA: Diagnosis not present

## 2013-11-14 DIAGNOSIS — C50419 Malignant neoplasm of upper-outer quadrant of unspecified female breast: Secondary | ICD-10-CM | POA: Diagnosis not present

## 2013-11-18 DIAGNOSIS — Z510 Encounter for antineoplastic radiation therapy: Secondary | ICD-10-CM | POA: Diagnosis not present

## 2013-11-18 DIAGNOSIS — C50419 Malignant neoplasm of upper-outer quadrant of unspecified female breast: Secondary | ICD-10-CM | POA: Diagnosis not present

## 2013-11-20 DIAGNOSIS — S339XXA Sprain of unspecified parts of lumbar spine and pelvis, initial encounter: Secondary | ICD-10-CM | POA: Diagnosis not present

## 2013-11-20 DIAGNOSIS — M999 Biomechanical lesion, unspecified: Secondary | ICD-10-CM | POA: Diagnosis not present

## 2013-11-21 DIAGNOSIS — Z510 Encounter for antineoplastic radiation therapy: Secondary | ICD-10-CM | POA: Diagnosis not present

## 2013-11-21 DIAGNOSIS — C50419 Malignant neoplasm of upper-outer quadrant of unspecified female breast: Secondary | ICD-10-CM | POA: Diagnosis not present

## 2013-11-24 DIAGNOSIS — Z510 Encounter for antineoplastic radiation therapy: Secondary | ICD-10-CM | POA: Diagnosis not present

## 2013-11-24 DIAGNOSIS — C50419 Malignant neoplasm of upper-outer quadrant of unspecified female breast: Secondary | ICD-10-CM | POA: Diagnosis not present

## 2013-12-01 DIAGNOSIS — C50419 Malignant neoplasm of upper-outer quadrant of unspecified female breast: Secondary | ICD-10-CM | POA: Diagnosis not present

## 2013-12-01 DIAGNOSIS — Z510 Encounter for antineoplastic radiation therapy: Secondary | ICD-10-CM | POA: Diagnosis not present

## 2013-12-02 DIAGNOSIS — C50419 Malignant neoplasm of upper-outer quadrant of unspecified female breast: Secondary | ICD-10-CM | POA: Diagnosis not present

## 2013-12-02 DIAGNOSIS — Z510 Encounter for antineoplastic radiation therapy: Secondary | ICD-10-CM | POA: Diagnosis not present

## 2013-12-03 DIAGNOSIS — Z510 Encounter for antineoplastic radiation therapy: Secondary | ICD-10-CM | POA: Diagnosis not present

## 2013-12-03 DIAGNOSIS — C50419 Malignant neoplasm of upper-outer quadrant of unspecified female breast: Secondary | ICD-10-CM | POA: Diagnosis not present

## 2013-12-04 DIAGNOSIS — Z510 Encounter for antineoplastic radiation therapy: Secondary | ICD-10-CM | POA: Diagnosis not present

## 2013-12-04 DIAGNOSIS — S339XXA Sprain of unspecified parts of lumbar spine and pelvis, initial encounter: Secondary | ICD-10-CM | POA: Diagnosis not present

## 2013-12-04 DIAGNOSIS — M999 Biomechanical lesion, unspecified: Secondary | ICD-10-CM | POA: Diagnosis not present

## 2013-12-04 DIAGNOSIS — C50419 Malignant neoplasm of upper-outer quadrant of unspecified female breast: Secondary | ICD-10-CM | POA: Diagnosis not present

## 2013-12-05 DIAGNOSIS — C50419 Malignant neoplasm of upper-outer quadrant of unspecified female breast: Secondary | ICD-10-CM | POA: Diagnosis not present

## 2013-12-05 DIAGNOSIS — Z510 Encounter for antineoplastic radiation therapy: Secondary | ICD-10-CM | POA: Diagnosis not present

## 2013-12-08 DIAGNOSIS — C50419 Malignant neoplasm of upper-outer quadrant of unspecified female breast: Secondary | ICD-10-CM | POA: Diagnosis not present

## 2013-12-08 DIAGNOSIS — Z510 Encounter for antineoplastic radiation therapy: Secondary | ICD-10-CM | POA: Diagnosis not present

## 2013-12-09 DIAGNOSIS — C50419 Malignant neoplasm of upper-outer quadrant of unspecified female breast: Secondary | ICD-10-CM | POA: Diagnosis not present

## 2013-12-09 DIAGNOSIS — Z510 Encounter for antineoplastic radiation therapy: Secondary | ICD-10-CM | POA: Diagnosis not present

## 2013-12-10 DIAGNOSIS — Z510 Encounter for antineoplastic radiation therapy: Secondary | ICD-10-CM | POA: Diagnosis not present

## 2013-12-10 DIAGNOSIS — C50419 Malignant neoplasm of upper-outer quadrant of unspecified female breast: Secondary | ICD-10-CM | POA: Diagnosis not present

## 2013-12-11 DIAGNOSIS — C50419 Malignant neoplasm of upper-outer quadrant of unspecified female breast: Secondary | ICD-10-CM | POA: Diagnosis not present

## 2013-12-11 DIAGNOSIS — Z510 Encounter for antineoplastic radiation therapy: Secondary | ICD-10-CM | POA: Diagnosis not present

## 2013-12-12 DIAGNOSIS — C50419 Malignant neoplasm of upper-outer quadrant of unspecified female breast: Secondary | ICD-10-CM | POA: Diagnosis not present

## 2013-12-12 DIAGNOSIS — Z510 Encounter for antineoplastic radiation therapy: Secondary | ICD-10-CM | POA: Diagnosis not present

## 2013-12-15 DIAGNOSIS — Z510 Encounter for antineoplastic radiation therapy: Secondary | ICD-10-CM | POA: Diagnosis not present

## 2013-12-15 DIAGNOSIS — C50419 Malignant neoplasm of upper-outer quadrant of unspecified female breast: Secondary | ICD-10-CM | POA: Diagnosis not present

## 2013-12-16 DIAGNOSIS — Z510 Encounter for antineoplastic radiation therapy: Secondary | ICD-10-CM | POA: Diagnosis not present

## 2013-12-16 DIAGNOSIS — C50419 Malignant neoplasm of upper-outer quadrant of unspecified female breast: Secondary | ICD-10-CM | POA: Diagnosis not present

## 2013-12-17 DIAGNOSIS — Z510 Encounter for antineoplastic radiation therapy: Secondary | ICD-10-CM | POA: Diagnosis not present

## 2013-12-17 DIAGNOSIS — C50419 Malignant neoplasm of upper-outer quadrant of unspecified female breast: Secondary | ICD-10-CM | POA: Diagnosis not present

## 2013-12-18 DIAGNOSIS — C50419 Malignant neoplasm of upper-outer quadrant of unspecified female breast: Secondary | ICD-10-CM | POA: Diagnosis not present

## 2013-12-18 DIAGNOSIS — Z510 Encounter for antineoplastic radiation therapy: Secondary | ICD-10-CM | POA: Diagnosis not present

## 2013-12-19 DIAGNOSIS — Z510 Encounter for antineoplastic radiation therapy: Secondary | ICD-10-CM | POA: Diagnosis not present

## 2013-12-19 DIAGNOSIS — C50419 Malignant neoplasm of upper-outer quadrant of unspecified female breast: Secondary | ICD-10-CM | POA: Diagnosis not present

## 2013-12-22 DIAGNOSIS — Z510 Encounter for antineoplastic radiation therapy: Secondary | ICD-10-CM | POA: Diagnosis not present

## 2013-12-22 DIAGNOSIS — E119 Type 2 diabetes mellitus without complications: Secondary | ICD-10-CM | POA: Diagnosis not present

## 2013-12-22 DIAGNOSIS — C50419 Malignant neoplasm of upper-outer quadrant of unspecified female breast: Secondary | ICD-10-CM | POA: Diagnosis not present

## 2013-12-23 DIAGNOSIS — E039 Hypothyroidism, unspecified: Secondary | ICD-10-CM | POA: Diagnosis not present

## 2013-12-23 DIAGNOSIS — Z0000 Encounter for general adult medical examination without abnormal findings: Secondary | ICD-10-CM | POA: Diagnosis not present

## 2013-12-23 DIAGNOSIS — E119 Type 2 diabetes mellitus without complications: Secondary | ICD-10-CM | POA: Diagnosis not present

## 2013-12-23 DIAGNOSIS — Z23 Encounter for immunization: Secondary | ICD-10-CM | POA: Diagnosis not present

## 2013-12-23 DIAGNOSIS — I10 Essential (primary) hypertension: Secondary | ICD-10-CM | POA: Diagnosis not present

## 2013-12-23 DIAGNOSIS — E7800 Pure hypercholesterolemia, unspecified: Secondary | ICD-10-CM | POA: Diagnosis not present

## 2014-01-01 DIAGNOSIS — S339XXA Sprain of unspecified parts of lumbar spine and pelvis, initial encounter: Secondary | ICD-10-CM | POA: Diagnosis not present

## 2014-01-01 DIAGNOSIS — M999 Biomechanical lesion, unspecified: Secondary | ICD-10-CM | POA: Diagnosis not present

## 2014-01-20 DIAGNOSIS — C50419 Malignant neoplasm of upper-outer quadrant of unspecified female breast: Secondary | ICD-10-CM | POA: Diagnosis not present

## 2014-01-20 DIAGNOSIS — Z09 Encounter for follow-up examination after completed treatment for conditions other than malignant neoplasm: Secondary | ICD-10-CM | POA: Diagnosis not present

## 2014-02-09 DIAGNOSIS — D490 Neoplasm of unspecified behavior of digestive system: Secondary | ICD-10-CM | POA: Diagnosis not present

## 2014-02-13 DIAGNOSIS — R221 Localized swelling, mass and lump, neck: Secondary | ICD-10-CM | POA: Diagnosis not present

## 2014-02-13 DIAGNOSIS — K119 Disease of salivary gland, unspecified: Secondary | ICD-10-CM | POA: Diagnosis not present

## 2014-02-13 DIAGNOSIS — D490 Neoplasm of unspecified behavior of digestive system: Secondary | ICD-10-CM | POA: Diagnosis not present

## 2014-02-13 DIAGNOSIS — R220 Localized swelling, mass and lump, head: Secondary | ICD-10-CM | POA: Diagnosis not present

## 2014-03-09 DIAGNOSIS — D490 Neoplasm of unspecified behavior of digestive system: Secondary | ICD-10-CM | POA: Diagnosis not present

## 2014-03-17 DIAGNOSIS — D119 Benign neoplasm of major salivary gland, unspecified: Secondary | ICD-10-CM | POA: Diagnosis not present

## 2014-03-17 DIAGNOSIS — I10 Essential (primary) hypertension: Secondary | ICD-10-CM | POA: Diagnosis not present

## 2014-03-17 DIAGNOSIS — D490 Neoplasm of unspecified behavior of digestive system: Secondary | ICD-10-CM | POA: Diagnosis not present

## 2014-03-17 DIAGNOSIS — E042 Nontoxic multinodular goiter: Secondary | ICD-10-CM | POA: Diagnosis not present

## 2014-03-17 DIAGNOSIS — E119 Type 2 diabetes mellitus without complications: Secondary | ICD-10-CM | POA: Diagnosis not present

## 2014-03-17 DIAGNOSIS — I2510 Atherosclerotic heart disease of native coronary artery without angina pectoris: Secondary | ICD-10-CM | POA: Diagnosis not present

## 2014-03-17 DIAGNOSIS — E785 Hyperlipidemia, unspecified: Secondary | ICD-10-CM | POA: Diagnosis not present

## 2014-04-13 DIAGNOSIS — D119 Benign neoplasm of major salivary gland, unspecified: Secondary | ICD-10-CM | POA: Diagnosis not present

## 2014-04-21 DIAGNOSIS — D119 Benign neoplasm of major salivary gland, unspecified: Secondary | ICD-10-CM | POA: Diagnosis not present

## 2014-05-12 DIAGNOSIS — Z79899 Other long term (current) drug therapy: Secondary | ICD-10-CM | POA: Diagnosis not present

## 2014-05-12 DIAGNOSIS — D37039 Neoplasm of uncertain behavior of the major salivary glands, unspecified: Secondary | ICD-10-CM | POA: Diagnosis not present

## 2014-05-12 DIAGNOSIS — J309 Allergic rhinitis, unspecified: Secondary | ICD-10-CM | POA: Diagnosis not present

## 2014-05-12 DIAGNOSIS — K116 Mucocele of salivary gland: Secondary | ICD-10-CM | POA: Diagnosis not present

## 2014-05-12 DIAGNOSIS — E119 Type 2 diabetes mellitus without complications: Secondary | ICD-10-CM | POA: Diagnosis not present

## 2014-05-12 DIAGNOSIS — E785 Hyperlipidemia, unspecified: Secondary | ICD-10-CM | POA: Diagnosis not present

## 2014-05-12 DIAGNOSIS — I10 Essential (primary) hypertension: Secondary | ICD-10-CM | POA: Diagnosis not present

## 2014-05-12 DIAGNOSIS — E039 Hypothyroidism, unspecified: Secondary | ICD-10-CM | POA: Diagnosis not present

## 2014-05-12 DIAGNOSIS — C50919 Malignant neoplasm of unspecified site of unspecified female breast: Secondary | ICD-10-CM | POA: Diagnosis not present

## 2014-05-12 DIAGNOSIS — D119 Benign neoplasm of major salivary gland, unspecified: Secondary | ICD-10-CM | POA: Diagnosis not present

## 2014-05-12 DIAGNOSIS — K219 Gastro-esophageal reflux disease without esophagitis: Secondary | ICD-10-CM | POA: Diagnosis not present

## 2014-05-12 DIAGNOSIS — I2510 Atherosclerotic heart disease of native coronary artery without angina pectoris: Secondary | ICD-10-CM | POA: Diagnosis not present

## 2014-05-12 DIAGNOSIS — Z951 Presence of aortocoronary bypass graft: Secondary | ICD-10-CM | POA: Diagnosis not present

## 2014-05-13 DIAGNOSIS — E039 Hypothyroidism, unspecified: Secondary | ICD-10-CM | POA: Diagnosis not present

## 2014-05-13 DIAGNOSIS — I10 Essential (primary) hypertension: Secondary | ICD-10-CM | POA: Diagnosis not present

## 2014-05-13 DIAGNOSIS — K219 Gastro-esophageal reflux disease without esophagitis: Secondary | ICD-10-CM | POA: Diagnosis not present

## 2014-05-13 DIAGNOSIS — E785 Hyperlipidemia, unspecified: Secondary | ICD-10-CM | POA: Diagnosis not present

## 2014-05-13 DIAGNOSIS — K116 Mucocele of salivary gland: Secondary | ICD-10-CM | POA: Diagnosis not present

## 2014-05-13 DIAGNOSIS — E119 Type 2 diabetes mellitus without complications: Secondary | ICD-10-CM | POA: Diagnosis not present

## 2014-05-29 DIAGNOSIS — IMO0002 Reserved for concepts with insufficient information to code with codable children: Secondary | ICD-10-CM | POA: Diagnosis not present

## 2014-06-22 DIAGNOSIS — Z09 Encounter for follow-up examination after completed treatment for conditions other than malignant neoplasm: Secondary | ICD-10-CM | POA: Diagnosis not present

## 2014-06-22 DIAGNOSIS — Z853 Personal history of malignant neoplasm of breast: Secondary | ICD-10-CM | POA: Diagnosis not present

## 2014-06-22 DIAGNOSIS — Z79811 Long term (current) use of aromatase inhibitors: Secondary | ICD-10-CM | POA: Diagnosis not present

## 2014-06-25 DIAGNOSIS — I10 Essential (primary) hypertension: Secondary | ICD-10-CM | POA: Diagnosis not present

## 2014-06-25 DIAGNOSIS — R609 Edema, unspecified: Secondary | ICD-10-CM | POA: Diagnosis not present

## 2014-06-25 DIAGNOSIS — E785 Hyperlipidemia, unspecified: Secondary | ICD-10-CM | POA: Diagnosis not present

## 2014-06-25 DIAGNOSIS — Z79899 Other long term (current) drug therapy: Secondary | ICD-10-CM | POA: Diagnosis not present

## 2014-06-25 DIAGNOSIS — E119 Type 2 diabetes mellitus without complications: Secondary | ICD-10-CM | POA: Diagnosis not present

## 2014-06-25 DIAGNOSIS — I2510 Atherosclerotic heart disease of native coronary artery without angina pectoris: Secondary | ICD-10-CM | POA: Diagnosis not present

## 2014-06-26 DIAGNOSIS — E119 Type 2 diabetes mellitus without complications: Secondary | ICD-10-CM | POA: Diagnosis not present

## 2014-07-01 DIAGNOSIS — E871 Hypo-osmolality and hyponatremia: Secondary | ICD-10-CM | POA: Diagnosis not present

## 2014-07-01 DIAGNOSIS — E119 Type 2 diabetes mellitus without complications: Secondary | ICD-10-CM | POA: Diagnosis not present

## 2014-07-01 DIAGNOSIS — E039 Hypothyroidism, unspecified: Secondary | ICD-10-CM | POA: Diagnosis not present

## 2014-07-02 DIAGNOSIS — E119 Type 2 diabetes mellitus without complications: Secondary | ICD-10-CM | POA: Diagnosis not present

## 2014-07-02 DIAGNOSIS — Z79899 Other long term (current) drug therapy: Secondary | ICD-10-CM | POA: Diagnosis not present

## 2014-07-02 DIAGNOSIS — I10 Essential (primary) hypertension: Secondary | ICD-10-CM | POA: Diagnosis not present

## 2014-07-02 DIAGNOSIS — Z0000 Encounter for general adult medical examination without abnormal findings: Secondary | ICD-10-CM | POA: Diagnosis not present

## 2014-07-14 DIAGNOSIS — I2510 Atherosclerotic heart disease of native coronary artery without angina pectoris: Secondary | ICD-10-CM | POA: Diagnosis not present

## 2014-07-14 DIAGNOSIS — Z79899 Other long term (current) drug therapy: Secondary | ICD-10-CM | POA: Diagnosis not present

## 2014-07-14 DIAGNOSIS — I10 Essential (primary) hypertension: Secondary | ICD-10-CM | POA: Diagnosis not present

## 2014-07-14 DIAGNOSIS — Z0000 Encounter for general adult medical examination without abnormal findings: Secondary | ICD-10-CM | POA: Diagnosis not present

## 2014-07-16 DIAGNOSIS — Z23 Encounter for immunization: Secondary | ICD-10-CM | POA: Diagnosis not present

## 2014-07-21 DIAGNOSIS — E785 Hyperlipidemia, unspecified: Secondary | ICD-10-CM | POA: Diagnosis not present

## 2014-07-21 DIAGNOSIS — I10 Essential (primary) hypertension: Secondary | ICD-10-CM | POA: Diagnosis not present

## 2014-07-21 DIAGNOSIS — Z0000 Encounter for general adult medical examination without abnormal findings: Secondary | ICD-10-CM | POA: Diagnosis not present

## 2014-07-21 DIAGNOSIS — Z79899 Other long term (current) drug therapy: Secondary | ICD-10-CM | POA: Diagnosis not present

## 2014-08-04 DIAGNOSIS — E785 Hyperlipidemia, unspecified: Secondary | ICD-10-CM | POA: Diagnosis not present

## 2014-09-07 DIAGNOSIS — I10 Essential (primary) hypertension: Secondary | ICD-10-CM | POA: Diagnosis not present

## 2014-09-07 DIAGNOSIS — E785 Hyperlipidemia, unspecified: Secondary | ICD-10-CM | POA: Diagnosis not present

## 2014-09-08 DIAGNOSIS — I2510 Atherosclerotic heart disease of native coronary artery without angina pectoris: Secondary | ICD-10-CM | POA: Diagnosis not present

## 2014-09-08 DIAGNOSIS — I10 Essential (primary) hypertension: Secondary | ICD-10-CM | POA: Diagnosis not present

## 2014-09-08 DIAGNOSIS — E785 Hyperlipidemia, unspecified: Secondary | ICD-10-CM | POA: Diagnosis not present

## 2014-09-08 DIAGNOSIS — E119 Type 2 diabetes mellitus without complications: Secondary | ICD-10-CM | POA: Diagnosis not present

## 2014-09-08 DIAGNOSIS — Z79899 Other long term (current) drug therapy: Secondary | ICD-10-CM | POA: Diagnosis not present

## 2014-09-28 DIAGNOSIS — Z853 Personal history of malignant neoplasm of breast: Secondary | ICD-10-CM | POA: Diagnosis not present

## 2014-09-28 DIAGNOSIS — Z08 Encounter for follow-up examination after completed treatment for malignant neoplasm: Secondary | ICD-10-CM | POA: Diagnosis not present

## 2014-09-28 DIAGNOSIS — Z13820 Encounter for screening for osteoporosis: Secondary | ICD-10-CM | POA: Diagnosis not present

## 2014-09-28 DIAGNOSIS — R922 Inconclusive mammogram: Secondary | ICD-10-CM | POA: Diagnosis not present

## 2014-09-28 DIAGNOSIS — R921 Mammographic calcification found on diagnostic imaging of breast: Secondary | ICD-10-CM | POA: Diagnosis not present

## 2014-09-30 DIAGNOSIS — R921 Mammographic calcification found on diagnostic imaging of breast: Secondary | ICD-10-CM | POA: Diagnosis not present

## 2014-09-30 DIAGNOSIS — N63 Unspecified lump in breast: Secondary | ICD-10-CM | POA: Diagnosis not present

## 2014-09-30 DIAGNOSIS — Z13820 Encounter for screening for osteoporosis: Secondary | ICD-10-CM | POA: Diagnosis not present

## 2014-09-30 DIAGNOSIS — C50411 Malignant neoplasm of upper-outer quadrant of right female breast: Secondary | ICD-10-CM | POA: Diagnosis not present

## 2014-10-08 DIAGNOSIS — Z79899 Other long term (current) drug therapy: Secondary | ICD-10-CM | POA: Diagnosis not present

## 2014-10-08 DIAGNOSIS — E785 Hyperlipidemia, unspecified: Secondary | ICD-10-CM | POA: Diagnosis not present

## 2014-10-08 DIAGNOSIS — I10 Essential (primary) hypertension: Secondary | ICD-10-CM | POA: Diagnosis not present

## 2014-10-22 DIAGNOSIS — Z853 Personal history of malignant neoplasm of breast: Secondary | ICD-10-CM | POA: Diagnosis not present

## 2014-12-09 DIAGNOSIS — E780 Pure hypercholesterolemia: Secondary | ICD-10-CM | POA: Diagnosis not present

## 2014-12-09 DIAGNOSIS — E039 Hypothyroidism, unspecified: Secondary | ICD-10-CM | POA: Diagnosis not present

## 2014-12-09 DIAGNOSIS — Z79899 Other long term (current) drug therapy: Secondary | ICD-10-CM | POA: Diagnosis not present

## 2014-12-14 DIAGNOSIS — I10 Essential (primary) hypertension: Secondary | ICD-10-CM | POA: Diagnosis not present

## 2014-12-14 DIAGNOSIS — E039 Hypothyroidism, unspecified: Secondary | ICD-10-CM | POA: Diagnosis not present

## 2014-12-14 DIAGNOSIS — E119 Type 2 diabetes mellitus without complications: Secondary | ICD-10-CM | POA: Diagnosis not present

## 2014-12-14 DIAGNOSIS — E780 Pure hypercholesterolemia: Secondary | ICD-10-CM | POA: Diagnosis not present

## 2015-02-22 DIAGNOSIS — Z79811 Long term (current) use of aromatase inhibitors: Secondary | ICD-10-CM | POA: Diagnosis not present

## 2015-02-22 DIAGNOSIS — Z853 Personal history of malignant neoplasm of breast: Secondary | ICD-10-CM | POA: Diagnosis not present

## 2015-03-16 DIAGNOSIS — H2512 Age-related nuclear cataract, left eye: Secondary | ICD-10-CM | POA: Diagnosis not present

## 2015-03-16 DIAGNOSIS — H3530 Unspecified macular degeneration: Secondary | ICD-10-CM | POA: Diagnosis not present

## 2015-03-23 DIAGNOSIS — E785 Hyperlipidemia, unspecified: Secondary | ICD-10-CM | POA: Diagnosis not present

## 2015-03-23 DIAGNOSIS — E119 Type 2 diabetes mellitus without complications: Secondary | ICD-10-CM | POA: Diagnosis not present

## 2015-03-23 DIAGNOSIS — H259 Unspecified age-related cataract: Secondary | ICD-10-CM | POA: Diagnosis not present

## 2015-03-23 DIAGNOSIS — I10 Essential (primary) hypertension: Secondary | ICD-10-CM | POA: Diagnosis not present

## 2015-03-23 DIAGNOSIS — Z7982 Long term (current) use of aspirin: Secondary | ICD-10-CM | POA: Diagnosis not present

## 2015-03-23 DIAGNOSIS — E039 Hypothyroidism, unspecified: Secondary | ICD-10-CM | POA: Diagnosis not present

## 2015-03-23 DIAGNOSIS — Z79899 Other long term (current) drug therapy: Secondary | ICD-10-CM | POA: Diagnosis not present

## 2015-03-23 DIAGNOSIS — H2512 Age-related nuclear cataract, left eye: Secondary | ICD-10-CM | POA: Diagnosis not present

## 2015-03-23 DIAGNOSIS — Z951 Presence of aortocoronary bypass graft: Secondary | ICD-10-CM | POA: Diagnosis not present

## 2015-04-27 DIAGNOSIS — E039 Hypothyroidism, unspecified: Secondary | ICD-10-CM | POA: Diagnosis not present

## 2015-04-27 DIAGNOSIS — E119 Type 2 diabetes mellitus without complications: Secondary | ICD-10-CM | POA: Diagnosis not present

## 2015-04-27 DIAGNOSIS — Z79899 Other long term (current) drug therapy: Secondary | ICD-10-CM | POA: Diagnosis not present

## 2015-04-27 DIAGNOSIS — I10 Essential (primary) hypertension: Secondary | ICD-10-CM | POA: Diagnosis not present

## 2015-04-27 DIAGNOSIS — E785 Hyperlipidemia, unspecified: Secondary | ICD-10-CM | POA: Diagnosis not present

## 2015-04-27 DIAGNOSIS — I2510 Atherosclerotic heart disease of native coronary artery without angina pectoris: Secondary | ICD-10-CM | POA: Diagnosis not present

## 2015-04-27 DIAGNOSIS — H2511 Age-related nuclear cataract, right eye: Secondary | ICD-10-CM | POA: Diagnosis not present

## 2015-04-27 DIAGNOSIS — Z7982 Long term (current) use of aspirin: Secondary | ICD-10-CM | POA: Diagnosis not present

## 2015-04-27 DIAGNOSIS — H259 Unspecified age-related cataract: Secondary | ICD-10-CM | POA: Diagnosis not present

## 2015-04-27 DIAGNOSIS — Z951 Presence of aortocoronary bypass graft: Secondary | ICD-10-CM | POA: Diagnosis not present

## 2015-06-01 DIAGNOSIS — Z79899 Other long term (current) drug therapy: Secondary | ICD-10-CM | POA: Diagnosis not present

## 2015-06-01 DIAGNOSIS — E039 Hypothyroidism, unspecified: Secondary | ICD-10-CM | POA: Diagnosis not present

## 2015-06-01 DIAGNOSIS — E119 Type 2 diabetes mellitus without complications: Secondary | ICD-10-CM | POA: Diagnosis not present

## 2015-06-09 DIAGNOSIS — I10 Essential (primary) hypertension: Secondary | ICD-10-CM | POA: Diagnosis not present

## 2015-06-09 DIAGNOSIS — E780 Pure hypercholesterolemia: Secondary | ICD-10-CM | POA: Diagnosis not present

## 2015-06-09 DIAGNOSIS — E119 Type 2 diabetes mellitus without complications: Secondary | ICD-10-CM | POA: Diagnosis not present

## 2015-06-09 DIAGNOSIS — Z0000 Encounter for general adult medical examination without abnormal findings: Secondary | ICD-10-CM | POA: Diagnosis not present

## 2015-06-09 DIAGNOSIS — E039 Hypothyroidism, unspecified: Secondary | ICD-10-CM | POA: Diagnosis not present

## 2015-06-16 DIAGNOSIS — Z79811 Long term (current) use of aromatase inhibitors: Secondary | ICD-10-CM | POA: Diagnosis not present

## 2015-06-16 DIAGNOSIS — Z853 Personal history of malignant neoplasm of breast: Secondary | ICD-10-CM | POA: Diagnosis not present

## 2015-07-28 DIAGNOSIS — E088 Diabetes mellitus due to underlying condition with unspecified complications: Secondary | ICD-10-CM | POA: Insufficient documentation

## 2015-07-28 DIAGNOSIS — I110 Hypertensive heart disease with heart failure: Secondary | ICD-10-CM

## 2015-07-28 DIAGNOSIS — I10 Essential (primary) hypertension: Secondary | ICD-10-CM | POA: Diagnosis not present

## 2015-07-28 DIAGNOSIS — I2510 Atherosclerotic heart disease of native coronary artery without angina pectoris: Secondary | ICD-10-CM

## 2015-07-28 DIAGNOSIS — E785 Hyperlipidemia, unspecified: Secondary | ICD-10-CM | POA: Diagnosis not present

## 2015-07-28 HISTORY — DX: Diabetes mellitus due to underlying condition with unspecified complications: E08.8

## 2015-07-28 HISTORY — DX: Atherosclerotic heart disease of native coronary artery without angina pectoris: I25.10

## 2015-07-28 HISTORY — DX: Hypertensive heart disease with heart failure: I11.0

## 2015-08-02 DIAGNOSIS — Z23 Encounter for immunization: Secondary | ICD-10-CM | POA: Diagnosis not present

## 2015-09-30 DIAGNOSIS — C50411 Malignant neoplasm of upper-outer quadrant of right female breast: Secondary | ICD-10-CM | POA: Diagnosis not present

## 2015-09-30 DIAGNOSIS — R928 Other abnormal and inconclusive findings on diagnostic imaging of breast: Secondary | ICD-10-CM | POA: Diagnosis not present

## 2015-10-14 DIAGNOSIS — Z800 Family history of malignant neoplasm of digestive organs: Secondary | ICD-10-CM | POA: Diagnosis not present

## 2015-10-14 DIAGNOSIS — Z853 Personal history of malignant neoplasm of breast: Secondary | ICD-10-CM | POA: Diagnosis not present

## 2015-10-14 DIAGNOSIS — Z803 Family history of malignant neoplasm of breast: Secondary | ICD-10-CM | POA: Diagnosis not present

## 2015-10-14 DIAGNOSIS — C50919 Malignant neoplasm of unspecified site of unspecified female breast: Secondary | ICD-10-CM | POA: Diagnosis not present

## 2015-10-14 DIAGNOSIS — Z79811 Long term (current) use of aromatase inhibitors: Secondary | ICD-10-CM | POA: Diagnosis not present

## 2015-10-14 DIAGNOSIS — Z170 Estrogen receptor positive status [ER+]: Secondary | ICD-10-CM | POA: Diagnosis not present

## 2015-11-11 DIAGNOSIS — Z853 Personal history of malignant neoplasm of breast: Secondary | ICD-10-CM | POA: Diagnosis not present

## 2015-12-08 DIAGNOSIS — E119 Type 2 diabetes mellitus without complications: Secondary | ICD-10-CM | POA: Diagnosis not present

## 2015-12-15 DIAGNOSIS — E7800 Pure hypercholesterolemia, unspecified: Secondary | ICD-10-CM | POA: Diagnosis not present

## 2015-12-15 DIAGNOSIS — E871 Hypo-osmolality and hyponatremia: Secondary | ICD-10-CM | POA: Diagnosis not present

## 2015-12-15 DIAGNOSIS — E039 Hypothyroidism, unspecified: Secondary | ICD-10-CM | POA: Diagnosis not present

## 2015-12-15 DIAGNOSIS — E119 Type 2 diabetes mellitus without complications: Secondary | ICD-10-CM | POA: Diagnosis not present

## 2015-12-15 DIAGNOSIS — I10 Essential (primary) hypertension: Secondary | ICD-10-CM | POA: Diagnosis not present

## 2016-01-24 DIAGNOSIS — Z6827 Body mass index (BMI) 27.0-27.9, adult: Secondary | ICD-10-CM | POA: Diagnosis not present

## 2016-01-24 DIAGNOSIS — E088 Diabetes mellitus due to underlying condition with unspecified complications: Secondary | ICD-10-CM | POA: Diagnosis not present

## 2016-01-24 DIAGNOSIS — I2510 Atherosclerotic heart disease of native coronary artery without angina pectoris: Secondary | ICD-10-CM | POA: Diagnosis not present

## 2016-01-24 DIAGNOSIS — E785 Hyperlipidemia, unspecified: Secondary | ICD-10-CM | POA: Diagnosis not present

## 2016-01-24 DIAGNOSIS — I10 Essential (primary) hypertension: Secondary | ICD-10-CM | POA: Diagnosis not present

## 2016-02-18 DIAGNOSIS — B9689 Other specified bacterial agents as the cause of diseases classified elsewhere: Secondary | ICD-10-CM | POA: Diagnosis not present

## 2016-02-18 DIAGNOSIS — J019 Acute sinusitis, unspecified: Secondary | ICD-10-CM | POA: Diagnosis not present

## 2016-04-13 DIAGNOSIS — Z79811 Long term (current) use of aromatase inhibitors: Secondary | ICD-10-CM | POA: Diagnosis not present

## 2016-04-13 DIAGNOSIS — Z853 Personal history of malignant neoplasm of breast: Secondary | ICD-10-CM | POA: Diagnosis not present

## 2016-05-18 DIAGNOSIS — E119 Type 2 diabetes mellitus without complications: Secondary | ICD-10-CM | POA: Diagnosis not present

## 2016-06-12 DIAGNOSIS — Z0000 Encounter for general adult medical examination without abnormal findings: Secondary | ICD-10-CM | POA: Diagnosis not present

## 2016-06-12 DIAGNOSIS — E7800 Pure hypercholesterolemia, unspecified: Secondary | ICD-10-CM | POA: Diagnosis not present

## 2016-06-12 DIAGNOSIS — I10 Essential (primary) hypertension: Secondary | ICD-10-CM | POA: Diagnosis not present

## 2016-06-12 DIAGNOSIS — E119 Type 2 diabetes mellitus without complications: Secondary | ICD-10-CM | POA: Diagnosis not present

## 2016-06-12 DIAGNOSIS — E039 Hypothyroidism, unspecified: Secondary | ICD-10-CM | POA: Diagnosis not present

## 2016-07-20 DIAGNOSIS — E088 Diabetes mellitus due to underlying condition with unspecified complications: Secondary | ICD-10-CM | POA: Diagnosis not present

## 2016-07-20 DIAGNOSIS — Z6828 Body mass index (BMI) 28.0-28.9, adult: Secondary | ICD-10-CM | POA: Diagnosis not present

## 2016-07-20 DIAGNOSIS — I10 Essential (primary) hypertension: Secondary | ICD-10-CM | POA: Diagnosis not present

## 2016-07-20 DIAGNOSIS — I2510 Atherosclerotic heart disease of native coronary artery without angina pectoris: Secondary | ICD-10-CM | POA: Diagnosis not present

## 2016-07-20 DIAGNOSIS — E785 Hyperlipidemia, unspecified: Secondary | ICD-10-CM | POA: Diagnosis not present

## 2016-09-12 DIAGNOSIS — E1169 Type 2 diabetes mellitus with other specified complication: Secondary | ICD-10-CM | POA: Diagnosis not present

## 2016-09-12 DIAGNOSIS — I4891 Unspecified atrial fibrillation: Secondary | ICD-10-CM | POA: Diagnosis not present

## 2016-09-12 DIAGNOSIS — R0609 Other forms of dyspnea: Secondary | ICD-10-CM | POA: Diagnosis not present

## 2016-09-12 DIAGNOSIS — E785 Hyperlipidemia, unspecified: Secondary | ICD-10-CM | POA: Diagnosis not present

## 2016-09-12 DIAGNOSIS — I2510 Atherosclerotic heart disease of native coronary artery without angina pectoris: Secondary | ICD-10-CM | POA: Diagnosis not present

## 2016-09-12 DIAGNOSIS — I10 Essential (primary) hypertension: Secondary | ICD-10-CM | POA: Diagnosis not present

## 2016-09-12 DIAGNOSIS — R600 Localized edema: Secondary | ICD-10-CM | POA: Diagnosis not present

## 2016-09-18 DIAGNOSIS — I10 Essential (primary) hypertension: Secondary | ICD-10-CM | POA: Diagnosis not present

## 2016-09-18 DIAGNOSIS — E1169 Type 2 diabetes mellitus with other specified complication: Secondary | ICD-10-CM | POA: Diagnosis not present

## 2016-09-18 DIAGNOSIS — R0609 Other forms of dyspnea: Secondary | ICD-10-CM | POA: Diagnosis not present

## 2016-09-18 DIAGNOSIS — R600 Localized edema: Secondary | ICD-10-CM | POA: Diagnosis not present

## 2016-09-18 DIAGNOSIS — Z6828 Body mass index (BMI) 28.0-28.9, adult: Secondary | ICD-10-CM | POA: Diagnosis not present

## 2016-09-18 DIAGNOSIS — I4891 Unspecified atrial fibrillation: Secondary | ICD-10-CM | POA: Diagnosis not present

## 2016-09-18 DIAGNOSIS — Z951 Presence of aortocoronary bypass graft: Secondary | ICD-10-CM | POA: Diagnosis not present

## 2016-09-18 DIAGNOSIS — E785 Hyperlipidemia, unspecified: Secondary | ICD-10-CM | POA: Diagnosis not present

## 2016-09-18 DIAGNOSIS — I2510 Atherosclerotic heart disease of native coronary artery without angina pectoris: Secondary | ICD-10-CM | POA: Diagnosis not present

## 2016-10-02 DIAGNOSIS — C50411 Malignant neoplasm of upper-outer quadrant of right female breast: Secondary | ICD-10-CM | POA: Diagnosis not present

## 2016-10-02 DIAGNOSIS — R928 Other abnormal and inconclusive findings on diagnostic imaging of breast: Secondary | ICD-10-CM | POA: Diagnosis not present

## 2016-10-02 DIAGNOSIS — Z9889 Other specified postprocedural states: Secondary | ICD-10-CM | POA: Diagnosis not present

## 2016-10-03 DIAGNOSIS — Z6829 Body mass index (BMI) 29.0-29.9, adult: Secondary | ICD-10-CM | POA: Diagnosis not present

## 2016-10-03 DIAGNOSIS — Z951 Presence of aortocoronary bypass graft: Secondary | ICD-10-CM

## 2016-10-03 DIAGNOSIS — I480 Paroxysmal atrial fibrillation: Secondary | ICD-10-CM | POA: Diagnosis not present

## 2016-10-03 DIAGNOSIS — I482 Chronic atrial fibrillation, unspecified: Secondary | ICD-10-CM

## 2016-10-03 DIAGNOSIS — E785 Hyperlipidemia, unspecified: Secondary | ICD-10-CM | POA: Diagnosis not present

## 2016-10-03 DIAGNOSIS — I10 Essential (primary) hypertension: Secondary | ICD-10-CM | POA: Diagnosis not present

## 2016-10-03 DIAGNOSIS — E088 Diabetes mellitus due to underlying condition with unspecified complications: Secondary | ICD-10-CM | POA: Diagnosis not present

## 2016-10-03 DIAGNOSIS — I2510 Atherosclerotic heart disease of native coronary artery without angina pectoris: Secondary | ICD-10-CM | POA: Diagnosis not present

## 2016-10-03 HISTORY — DX: Chronic atrial fibrillation, unspecified: I48.20

## 2016-10-03 HISTORY — DX: Presence of aortocoronary bypass graft: Z95.1

## 2016-10-04 DIAGNOSIS — Z170 Estrogen receptor positive status [ER+]: Secondary | ICD-10-CM | POA: Diagnosis not present

## 2016-10-04 DIAGNOSIS — C50411 Malignant neoplasm of upper-outer quadrant of right female breast: Secondary | ICD-10-CM | POA: Diagnosis not present

## 2016-10-04 DIAGNOSIS — Z79811 Long term (current) use of aromatase inhibitors: Secondary | ICD-10-CM | POA: Diagnosis not present

## 2016-10-04 DIAGNOSIS — Z853 Personal history of malignant neoplasm of breast: Secondary | ICD-10-CM | POA: Diagnosis not present

## 2016-10-12 DIAGNOSIS — E119 Type 2 diabetes mellitus without complications: Secondary | ICD-10-CM

## 2016-10-12 DIAGNOSIS — I482 Chronic atrial fibrillation: Secondary | ICD-10-CM | POA: Diagnosis not present

## 2016-10-12 DIAGNOSIS — I10 Essential (primary) hypertension: Secondary | ICD-10-CM

## 2016-10-12 DIAGNOSIS — I2510 Atherosclerotic heart disease of native coronary artery without angina pectoris: Secondary | ICD-10-CM | POA: Diagnosis not present

## 2016-10-12 DIAGNOSIS — I119 Hypertensive heart disease without heart failure: Secondary | ICD-10-CM | POA: Diagnosis not present

## 2016-10-12 DIAGNOSIS — E785 Hyperlipidemia, unspecified: Secondary | ICD-10-CM

## 2016-10-12 DIAGNOSIS — E871 Hypo-osmolality and hyponatremia: Secondary | ICD-10-CM | POA: Diagnosis not present

## 2016-10-12 DIAGNOSIS — E876 Hypokalemia: Secondary | ICD-10-CM | POA: Diagnosis not present

## 2016-10-12 DIAGNOSIS — E039 Hypothyroidism, unspecified: Secondary | ICD-10-CM | POA: Diagnosis not present

## 2016-10-12 DIAGNOSIS — Z8249 Family history of ischemic heart disease and other diseases of the circulatory system: Secondary | ICD-10-CM | POA: Diagnosis not present

## 2016-10-12 DIAGNOSIS — K219 Gastro-esophageal reflux disease without esophagitis: Secondary | ICD-10-CM | POA: Diagnosis not present

## 2016-10-12 DIAGNOSIS — E784 Other hyperlipidemia: Secondary | ICD-10-CM | POA: Diagnosis not present

## 2016-10-12 DIAGNOSIS — I480 Paroxysmal atrial fibrillation: Secondary | ICD-10-CM | POA: Diagnosis not present

## 2016-10-12 DIAGNOSIS — Z853 Personal history of malignant neoplasm of breast: Secondary | ICD-10-CM

## 2016-10-12 DIAGNOSIS — I4891 Unspecified atrial fibrillation: Secondary | ICD-10-CM | POA: Diagnosis not present

## 2016-10-13 DIAGNOSIS — Z7982 Long term (current) use of aspirin: Secondary | ICD-10-CM | POA: Diagnosis not present

## 2016-10-13 DIAGNOSIS — I119 Hypertensive heart disease without heart failure: Secondary | ICD-10-CM | POA: Diagnosis not present

## 2016-10-13 DIAGNOSIS — T503X5A Adverse effect of electrolytic, caloric and water-balance agents, initial encounter: Secondary | ICD-10-CM | POA: Diagnosis present

## 2016-10-13 DIAGNOSIS — K649 Unspecified hemorrhoids: Secondary | ICD-10-CM | POA: Diagnosis not present

## 2016-10-13 DIAGNOSIS — R0602 Shortness of breath: Secondary | ICD-10-CM | POA: Diagnosis present

## 2016-10-13 DIAGNOSIS — E876 Hypokalemia: Secondary | ICD-10-CM | POA: Diagnosis not present

## 2016-10-13 DIAGNOSIS — Z7902 Long term (current) use of antithrombotics/antiplatelets: Secondary | ICD-10-CM | POA: Diagnosis not present

## 2016-10-13 DIAGNOSIS — R319 Hematuria, unspecified: Secondary | ICD-10-CM

## 2016-10-13 DIAGNOSIS — Z79899 Other long term (current) drug therapy: Secondary | ICD-10-CM | POA: Diagnosis not present

## 2016-10-13 DIAGNOSIS — K5731 Diverticulosis of large intestine without perforation or abscess with bleeding: Secondary | ICD-10-CM | POA: Diagnosis not present

## 2016-10-13 DIAGNOSIS — K5730 Diverticulosis of large intestine without perforation or abscess without bleeding: Secondary | ICD-10-CM | POA: Diagnosis not present

## 2016-10-13 DIAGNOSIS — I4891 Unspecified atrial fibrillation: Secondary | ICD-10-CM | POA: Diagnosis not present

## 2016-10-13 DIAGNOSIS — R310 Gross hematuria: Secondary | ICD-10-CM | POA: Diagnosis not present

## 2016-10-13 DIAGNOSIS — E785 Hyperlipidemia, unspecified: Secondary | ICD-10-CM | POA: Diagnosis present

## 2016-10-13 DIAGNOSIS — Z853 Personal history of malignant neoplasm of breast: Secondary | ICD-10-CM | POA: Diagnosis not present

## 2016-10-13 DIAGNOSIS — E784 Other hyperlipidemia: Secondary | ICD-10-CM | POA: Diagnosis not present

## 2016-10-13 DIAGNOSIS — Z882 Allergy status to sulfonamides status: Secondary | ICD-10-CM | POA: Diagnosis not present

## 2016-10-13 DIAGNOSIS — E871 Hypo-osmolality and hyponatremia: Secondary | ICD-10-CM | POA: Diagnosis not present

## 2016-10-13 DIAGNOSIS — I10 Essential (primary) hypertension: Secondary | ICD-10-CM | POA: Diagnosis present

## 2016-10-13 DIAGNOSIS — E039 Hypothyroidism, unspecified: Secondary | ICD-10-CM | POA: Diagnosis not present

## 2016-10-13 DIAGNOSIS — K625 Hemorrhage of anus and rectum: Secondary | ICD-10-CM | POA: Diagnosis not present

## 2016-10-13 DIAGNOSIS — M1990 Unspecified osteoarthritis, unspecified site: Secondary | ICD-10-CM | POA: Diagnosis present

## 2016-10-13 DIAGNOSIS — I482 Chronic atrial fibrillation: Secondary | ICD-10-CM | POA: Diagnosis not present

## 2016-10-13 DIAGNOSIS — I25810 Atherosclerosis of coronary artery bypass graft(s) without angina pectoris: Secondary | ICD-10-CM | POA: Diagnosis present

## 2016-10-13 DIAGNOSIS — E119 Type 2 diabetes mellitus without complications: Secondary | ICD-10-CM | POA: Diagnosis not present

## 2016-10-13 DIAGNOSIS — K219 Gastro-esophageal reflux disease without esophagitis: Secondary | ICD-10-CM | POA: Diagnosis not present

## 2016-10-13 DIAGNOSIS — I2510 Atherosclerotic heart disease of native coronary artery without angina pectoris: Secondary | ICD-10-CM | POA: Diagnosis not present

## 2016-10-13 DIAGNOSIS — K921 Melena: Secondary | ICD-10-CM

## 2016-10-13 DIAGNOSIS — Z888 Allergy status to other drugs, medicaments and biological substances status: Secondary | ICD-10-CM | POA: Diagnosis not present

## 2016-10-13 DIAGNOSIS — Z951 Presence of aortocoronary bypass graft: Secondary | ICD-10-CM | POA: Diagnosis not present

## 2016-10-15 DIAGNOSIS — K5790 Diverticulosis of intestine, part unspecified, without perforation or abscess without bleeding: Secondary | ICD-10-CM

## 2016-10-15 DIAGNOSIS — K649 Unspecified hemorrhoids: Secondary | ICD-10-CM

## 2016-10-25 DIAGNOSIS — I4891 Unspecified atrial fibrillation: Secondary | ICD-10-CM | POA: Diagnosis not present

## 2016-10-25 DIAGNOSIS — E876 Hypokalemia: Secondary | ICD-10-CM | POA: Diagnosis not present

## 2016-11-02 DIAGNOSIS — I509 Heart failure, unspecified: Secondary | ICD-10-CM | POA: Diagnosis not present

## 2016-11-04 DIAGNOSIS — R040 Epistaxis: Secondary | ICD-10-CM | POA: Diagnosis not present

## 2016-11-04 DIAGNOSIS — I4891 Unspecified atrial fibrillation: Secondary | ICD-10-CM | POA: Diagnosis not present

## 2016-11-04 DIAGNOSIS — Z7901 Long term (current) use of anticoagulants: Secondary | ICD-10-CM | POA: Diagnosis not present

## 2016-11-06 DIAGNOSIS — R0683 Snoring: Secondary | ICD-10-CM | POA: Diagnosis not present

## 2016-11-06 DIAGNOSIS — Z7901 Long term (current) use of anticoagulants: Secondary | ICD-10-CM | POA: Diagnosis not present

## 2016-11-06 DIAGNOSIS — I5032 Chronic diastolic (congestive) heart failure: Secondary | ICD-10-CM

## 2016-11-06 DIAGNOSIS — I110 Hypertensive heart disease with heart failure: Secondary | ICD-10-CM | POA: Diagnosis not present

## 2016-11-06 DIAGNOSIS — E871 Hypo-osmolality and hyponatremia: Secondary | ICD-10-CM | POA: Diagnosis not present

## 2016-11-06 DIAGNOSIS — I481 Persistent atrial fibrillation: Secondary | ICD-10-CM | POA: Diagnosis not present

## 2016-11-06 HISTORY — DX: Chronic diastolic (congestive) heart failure: I50.32

## 2016-11-07 DIAGNOSIS — R040 Epistaxis: Secondary | ICD-10-CM | POA: Diagnosis not present

## 2016-11-07 DIAGNOSIS — Z7901 Long term (current) use of anticoagulants: Secondary | ICD-10-CM | POA: Diagnosis not present

## 2016-11-07 DIAGNOSIS — I10 Essential (primary) hypertension: Secondary | ICD-10-CM | POA: Diagnosis not present

## 2016-11-09 DIAGNOSIS — E871 Hypo-osmolality and hyponatremia: Secondary | ICD-10-CM | POA: Diagnosis not present

## 2016-11-09 DIAGNOSIS — I5032 Chronic diastolic (congestive) heart failure: Secondary | ICD-10-CM | POA: Diagnosis not present

## 2016-11-09 DIAGNOSIS — I481 Persistent atrial fibrillation: Secondary | ICD-10-CM | POA: Diagnosis not present

## 2016-11-09 DIAGNOSIS — Z7901 Long term (current) use of anticoagulants: Secondary | ICD-10-CM | POA: Diagnosis not present

## 2016-11-09 DIAGNOSIS — I10 Essential (primary) hypertension: Secondary | ICD-10-CM | POA: Diagnosis not present

## 2016-11-09 DIAGNOSIS — Z6828 Body mass index (BMI) 28.0-28.9, adult: Secondary | ICD-10-CM | POA: Diagnosis not present

## 2016-11-21 DIAGNOSIS — J342 Deviated nasal septum: Secondary | ICD-10-CM | POA: Diagnosis not present

## 2016-11-21 DIAGNOSIS — Z7982 Long term (current) use of aspirin: Secondary | ICD-10-CM | POA: Diagnosis not present

## 2016-11-21 DIAGNOSIS — I10 Essential (primary) hypertension: Secondary | ICD-10-CM | POA: Diagnosis not present

## 2016-11-21 DIAGNOSIS — R040 Epistaxis: Secondary | ICD-10-CM | POA: Diagnosis not present

## 2016-11-27 DIAGNOSIS — E876 Hypokalemia: Secondary | ICD-10-CM | POA: Diagnosis not present

## 2016-11-27 DIAGNOSIS — Z6826 Body mass index (BMI) 26.0-26.9, adult: Secondary | ICD-10-CM | POA: Diagnosis not present

## 2016-11-27 DIAGNOSIS — I10 Essential (primary) hypertension: Secondary | ICD-10-CM | POA: Diagnosis not present

## 2016-11-27 DIAGNOSIS — I480 Paroxysmal atrial fibrillation: Secondary | ICD-10-CM | POA: Diagnosis not present

## 2016-11-27 DIAGNOSIS — I5032 Chronic diastolic (congestive) heart failure: Secondary | ICD-10-CM | POA: Diagnosis not present

## 2016-11-27 DIAGNOSIS — E871 Hypo-osmolality and hyponatremia: Secondary | ICD-10-CM | POA: Diagnosis not present

## 2016-12-05 DIAGNOSIS — E119 Type 2 diabetes mellitus without complications: Secondary | ICD-10-CM | POA: Diagnosis not present

## 2016-12-07 DIAGNOSIS — E119 Type 2 diabetes mellitus without complications: Secondary | ICD-10-CM | POA: Diagnosis not present

## 2016-12-07 DIAGNOSIS — E7800 Pure hypercholesterolemia, unspecified: Secondary | ICD-10-CM | POA: Diagnosis not present

## 2016-12-07 DIAGNOSIS — I10 Essential (primary) hypertension: Secondary | ICD-10-CM | POA: Diagnosis not present

## 2016-12-07 DIAGNOSIS — E039 Hypothyroidism, unspecified: Secondary | ICD-10-CM | POA: Diagnosis not present

## 2016-12-28 DIAGNOSIS — Z6825 Body mass index (BMI) 25.0-25.9, adult: Secondary | ICD-10-CM | POA: Diagnosis not present

## 2016-12-28 DIAGNOSIS — Z951 Presence of aortocoronary bypass graft: Secondary | ICD-10-CM | POA: Diagnosis not present

## 2017-01-02 DIAGNOSIS — I5030 Unspecified diastolic (congestive) heart failure: Secondary | ICD-10-CM | POA: Diagnosis not present

## 2017-01-02 DIAGNOSIS — I2510 Atherosclerotic heart disease of native coronary artery without angina pectoris: Secondary | ICD-10-CM | POA: Diagnosis not present

## 2017-01-02 DIAGNOSIS — I4891 Unspecified atrial fibrillation: Secondary | ICD-10-CM | POA: Diagnosis not present

## 2017-03-27 DIAGNOSIS — Z170 Estrogen receptor positive status [ER+]: Secondary | ICD-10-CM | POA: Diagnosis not present

## 2017-03-27 DIAGNOSIS — C50919 Malignant neoplasm of unspecified site of unspecified female breast: Secondary | ICD-10-CM | POA: Diagnosis not present

## 2017-03-27 DIAGNOSIS — Z79811 Long term (current) use of aromatase inhibitors: Secondary | ICD-10-CM | POA: Diagnosis not present

## 2017-03-27 DIAGNOSIS — Z853 Personal history of malignant neoplasm of breast: Secondary | ICD-10-CM | POA: Diagnosis not present

## 2017-04-30 ENCOUNTER — Encounter: Payer: Self-pay | Admitting: Cardiology

## 2017-05-01 ENCOUNTER — Ambulatory Visit (INDEPENDENT_AMBULATORY_CARE_PROVIDER_SITE_OTHER): Payer: Medicare Other | Admitting: Cardiology

## 2017-05-01 ENCOUNTER — Encounter: Payer: Self-pay | Admitting: Cardiology

## 2017-05-01 VITALS — BP 132/72 | HR 95 | Ht 61.0 in | Wt 131.0 lb

## 2017-05-01 DIAGNOSIS — I2510 Atherosclerotic heart disease of native coronary artery without angina pectoris: Secondary | ICD-10-CM | POA: Diagnosis not present

## 2017-05-01 DIAGNOSIS — I10 Essential (primary) hypertension: Secondary | ICD-10-CM | POA: Diagnosis not present

## 2017-05-01 DIAGNOSIS — I480 Paroxysmal atrial fibrillation: Secondary | ICD-10-CM

## 2017-05-01 DIAGNOSIS — I5032 Chronic diastolic (congestive) heart failure: Secondary | ICD-10-CM | POA: Diagnosis not present

## 2017-05-01 DIAGNOSIS — E785 Hyperlipidemia, unspecified: Secondary | ICD-10-CM

## 2017-05-01 MED ORDER — BUMETANIDE 1 MG PO TABS: 1.5000 mg | ORAL_TABLET | Freq: Two times a day (BID) | ORAL | 6 refills | Status: DC

## 2017-05-01 MED ORDER — APIXABAN 5 MG PO TABS: 5.0000 mg | ORAL_TABLET | Freq: Two times a day (BID) | ORAL | 6 refills | Status: DC

## 2017-05-01 MED ORDER — BUMETANIDE 1 MG PO TABS: 1.5000 mg | ORAL_TABLET | Freq: Two times a day (BID) | ORAL | 3 refills | Status: DC

## 2017-05-01 NOTE — Patient Instructions (Addendum)
Medication Instructions:  Your physician has recommended you make the following change in your medication: increase Bumex to 1.5 mg twice daily.   Labwork: Your physician recommends that you return for lab work in: today. BMP, BNP.   Testing/Procedures: None  Follow-Up: Your physician recommends that you schedule a follow-up appointment in: 3 months.   Any Other Special Instructions Will Be Listed Below (If Applicable).     If you need a refill on your cardiac medications before your next appointment, please call your pharmacy.   Heart Failure Heart failure means your heart has trouble pumping blood. This makes it hard for your body to work well. Heart failure is usually a long-term (chronic) condition. You must take good care of yourself and follow your doctor's treatment plan. Follow these instructions at home:  Take your heart medicine as told by your doctor. ? Do not stop taking medicine unless your doctor tells you to. ? Do not skip any dose of medicine. ? Refill your medicines before they run out. ? Take other medicines only as told by your doctor or pharmacist.  Stay active if told by your doctor. The elderly and people with severe heart failure should talk with a doctor about physical activity.  Eat heart-healthy foods. Choose foods that are without trans fat and are low in saturated fat, cholesterol, and salt (sodium). This includes fresh or frozen fruits and vegetables, fish, lean meats, fat-free or low-fat dairy foods, whole grains, and high-fiber foods. Lentils and dried peas and beans (legumes) are also good choices.  Limit salt if told by your doctor.  Cook in a healthy way. Roast, grill, broil, bake, poach, steam, or stir-fry foods.  Limit fluids as told by your doctor.  Weigh yourself every morning. Do this after you pee (urinate) and before you eat breakfast. Write down your weight to give to your doctor.  Take your blood pressure and write it down if  your doctor tells you to.  Ask your doctor how to check your pulse. Check your pulse as told.  Lose weight if told by your doctor.  Stop smoking or chewing tobacco. Do not use gum or patches that help you quit without your doctor's approval.  Schedule and go to doctor visits as told.  Nonpregnant women should have no more than 1 drink a day. Men should have no more than 2 drinks a day. Talk to your doctor about drinking alcohol.  Stop illegal drug use.  Stay current with shots (immunizations).  Manage your health conditions as told by your doctor.  Learn to manage your stress.  Rest when you are tired.  If it is really hot outside: ? Avoid intense activities. ? Use air conditioning or fans, or get in a cooler place. ? Avoid caffeine and alcohol. ? Wear loose-fitting, lightweight, and light-colored clothing.  If it is really cold outside: ? Avoid intense activities. ? Layer your clothing. ? Wear mittens or gloves, a hat, and a scarf when going outside. ? Avoid alcohol.  Learn about heart failure and get support as needed.  Get help to maintain or improve your quality of life and your ability to care for yourself as needed. Contact a doctor if:  You gain weight quickly.  You are more short of breath than usual.  You cannot do your normal activities.  You tire easily.  You cough more than normal, especially with activity.  You have any or more puffiness (swelling) in areas such as your hands, feet, ankles,  or belly (abdomen).  You cannot sleep because it is hard to breathe.  You feel like your heart is beating fast (palpitations).  You get dizzy or light-headed when you stand up. Get help right away if:  You have trouble breathing.  There is a change in mental status, such as becoming less alert or not being able to focus.  You have chest pain or discomfort.  You faint. This information is not intended to replace advice given to you by your health care  provider. Make sure you discuss any questions you have with your health care provider. Document Released: 07/25/2008 Document Revised: 03/23/2016 Document Reviewed: 12/02/2012 Elsevier Interactive Patient Education  2017 [employer].

## 2017-05-01 NOTE — Progress Notes (Signed)
Cardiology Office Note:    Date:  05/03/2017   ID:  Angel Gentry, DOB [birth year], MRN [id number]  PCP:  Ronita Hipps, MD  Cardiologist:  Shirlee More, MD     ASSESSMENT:    1. Coronary artery disease involving native coronary artery of native heart without angina pectoris   2. Chronic diastolic congestive heart failure ([place])   3. PAF (paroxysmal atrial fibrillation) ([place])   4. Essential hypertension   5. Dyslipidemia    PLAN:    In order of problems listed above:  1. StableContinue current medical treatment 2. Decompensated, diuretic will be increased 50% and call if unimproved in 2 weeks. I told her to expect that her weight was decreased 2-3 pounds 3. Stable, continue her current anticoagulant and beta blocker 4. Stable., BP is at target, continue diuretic, BB and ARB 5. Stable, continue her statin  Next appointment: 3 months   Medication Adjustments/Labs and Tests Ordered: Current medicines are reviewed at length with the patient today.  Concerns regarding medicines are outlined above.  Orders Placed This Encounter  Procedures  . B Nat Peptide  . Basic Metabolic Panel (BMET)   Meds ordered this encounter  Medications  . DISCONTD: bumetanide (BUMEX) 1 MG tablet    Sig: Take 1.5 tablets (1.5 mg total) by mouth 2 (two) times daily.    Dispense:  45 tablet    Refill:  6  . apixaban (ELIQUIS) 5 MG TABS tablet    Sig: Take 1 tablet (5 mg total) by mouth 2 (two) times daily.    Dispense:  60 tablet    Refill:  6  . bumetanide (BUMEX) 1 MG tablet    Sig: Take 1.5 tablets (1.5 mg total) by mouth 2 (two) times daily.    Dispense:  270 tablet    Refill:  3    Chief Complaint  Patient presents with  . Follow-up    Routine flup appt  . Palpitations    x 2-3 weeks    History of Present Illness:    Angel Gentry is a 82 y.o. female with a hx of CAD, CHF, Atrial Fibrillation, S/P CABG. Her weight is stable but increased edema and exertional Sob associated with  resting HR more rapid but still < 100 BPM.     . Her home weight, BP and HR are stable. Compliance with diet, lifestyle and medications: yes Past Medical History:  Diagnosis Date  . CAD in native artery   . Dyslipidemia   . Essential hypertension   . Type 2 diabetes mellitus (HCC)     No past surgical history on file.  Current Medications: Current Meds  Medication Sig  . anastrozole (ARIMIDEX) 1 MG tablet Take 1 mg by mouth daily.  Marland Kitchen apixaban (ELIQUIS) 5 MG TABS tablet Take 1 tablet (5 mg total) by mouth 2 (two) times daily.  . bumetanide (BUMEX) 1 MG tablet Take 1.5 tablets (1.5 mg total) by mouth 2 (two) times daily.  . Ergocalciferol (VITAMIN D2 PO) Take 1.25 mg by mouth. Takes one every two weeks  . fenofibrate micronized (LOFIBRA) 200 MG capsule Take 200 capsules by mouth daily.  Marland Kitchen levothyroxine (SYNTHROID, LEVOTHROID) 88 MCG tablet Take 88 mcg by mouth daily.  Marland Kitchen losartan (COZAAR) 50 MG tablet Take 50 mg by mouth daily.  . metoprolol succinate (TOPROL-XL) 50 MG 24 hr tablet Take 50 mg by mouth daily.  . Multiple Vitamin (MULTIVITAMIN) capsule Take by mouth daily.  . nitroGLYCERIN (NITROSTAT) 0.4  MG SL tablet Place 0.4 mg under the tongue.  Marland Kitchen omeprazole (PRILOSEC) 20 MG capsule Take 20 mg by mouth daily.  . potassium chloride SA (K-DUR,KLOR-CON) 20 MEQ tablet Take 20 mEq by mouth daily.  . simvastatin (ZOCOR) 10 MG tablet Take 10 mg by mouth daily.  . [DISCONTINUED] apixaban (ELIQUIS) 5 MG TABS tablet Take 5 mg by mouth 2 (two) times daily.  . [DISCONTINUED] bumetanide (BUMEX) 1 MG tablet Take 1 mg by mouth 2 (two) times daily.  . [DISCONTINUED] bumetanide (BUMEX) 1 MG tablet Take 1.5 tablets (1.5 mg total) by mouth 2 (two) times daily.     Allergies:   Nabumetone and Sulfa antibiotics   Social History   Social History  . Marital status: Married    Spouse name: N/A  . Number of children: N/A  . Years of education: N/A   Social History Main Topics  . Smoking status:  Never Smoker  . Smokeless tobacco: Never Used  . Alcohol use Not on file  . Drug use: Unknown  . Sexual activity: Not on file   Other Topics Concern  . Not on file   Social History Narrative  . No narrative on file     Family History: The patient's family history includes Cancer in her sister; Heart disease in her brother and mother. ROS:   Please see the history of present illness.    All other systems reviewed and are negative.  EKGs/Labs/Other Studies Reviewed:      Recent Labs: 05/01/2017: BNP WILL FOLLOW; BUN 30; Creatinine, Ser 1.05; Potassium 4.3; Sodium 138  Recent Lipid Panel No results found for: CHOL, TRIG, HDL, CHOLHDL, VLDL, LDLCALC, LDLDIRECT  Physical Exam:    VS:  BP 132/72   Pulse 95   Ht 5\' 1"  (1.549 m)   Wt 131 lb (59.4 kg)   SpO2 98%   BMI 24.75 kg/m     Wt Readings from Last 3 Encounters:  05/01/17 131 lb (59.4 kg)     GEN:  Well nourished, well developed in no acute distress HEENT: Normal NECK: No JVD; No carotid bruits LYMPHATICS: No lymphadenopathy CARDIAC: Irr Irr variable s1 , rubs, gallops RESPIRATORY:  Clear to auscultation without rales, wheezing or rhonchi  ABDOMEN: Soft, non-tender, non-distended MUSCULOSKELETAL:  1-2 + lower extremity edema; No deformity  SKIN: Warm and dry NEUROLOGIC:  Alert and oriented x 3 PSYCHIATRIC:  Normal affect    Signed, Shirlee More, MD  05/03/2017 8:15 AM    [place]

## 2017-05-05 LAB — BASIC METABOLIC PANEL
BUN / CREAT RATIO: 29 — AB (ref 12–28)
BUN: 30 mg/dL — ABNORMAL HIGH (ref 8–27)
CALCIUM: 10 mg/dL (ref 8.7–10.3)
CHLORIDE: 96 mmol/L (ref 96–106)
CO2: 28 mmol/L (ref 20–29)
Creatinine, Ser: 1.05 mg/dL — ABNORMAL HIGH (ref 0.57–1.00)
GFR calc non Af Amer: 50 mL/min/{1.73_m2} — ABNORMAL LOW (ref >59)
GFR, EST AFRICAN AMERICAN: 58 mL/min/{1.73_m2} — AB (ref >59)
Glucose: 110 mg/dL — ABNORMAL HIGH (ref 65–99)
POTASSIUM: 4.3 mmol/L (ref 3.5–5.2)
SODIUM: 138 mmol/L (ref 134–144)

## 2017-05-05 LAB — BRAIN NATRIURETIC PEPTIDE

## 2017-06-06 ENCOUNTER — Telehealth: Payer: Self-pay

## 2017-06-06 NOTE — Telephone Encounter (Signed)
Left message to return call 

## 2017-06-06 NOTE — Telephone Encounter (Signed)
Please advise 

## 2017-06-06 NOTE — Telephone Encounter (Signed)
Patient needs to know if she need abx before dentist [email or web address]

## 2017-06-06 NOTE — Telephone Encounter (Signed)
Patient advised no antibiotics needed. Patient verbalized understanding.

## 2017-06-06 NOTE — Telephone Encounter (Signed)
Not needed

## 2017-06-13 DIAGNOSIS — Z79899 Other long term (current) drug therapy: Secondary | ICD-10-CM | POA: Diagnosis not present

## 2017-06-13 DIAGNOSIS — E039 Hypothyroidism, unspecified: Secondary | ICD-10-CM | POA: Diagnosis not present

## 2017-06-13 DIAGNOSIS — E119 Type 2 diabetes mellitus without complications: Secondary | ICD-10-CM | POA: Diagnosis not present

## 2017-06-20 DIAGNOSIS — Z9181 History of falling: Secondary | ICD-10-CM | POA: Diagnosis not present

## 2017-06-20 DIAGNOSIS — Z6824 Body mass index (BMI) 24.0-24.9, adult: Secondary | ICD-10-CM | POA: Diagnosis not present

## 2017-06-20 DIAGNOSIS — Z0000 Encounter for general adult medical examination without abnormal findings: Secondary | ICD-10-CM | POA: Diagnosis not present

## 2017-06-20 DIAGNOSIS — E119 Type 2 diabetes mellitus without complications: Secondary | ICD-10-CM | POA: Diagnosis not present

## 2017-06-20 DIAGNOSIS — Z23 Encounter for immunization: Secondary | ICD-10-CM | POA: Diagnosis not present

## 2017-06-20 DIAGNOSIS — Z1389 Encounter for screening for other disorder: Secondary | ICD-10-CM | POA: Diagnosis not present

## 2017-07-11 DIAGNOSIS — D2239 Melanocytic nevi of other parts of face: Secondary | ICD-10-CM | POA: Diagnosis not present

## 2017-07-11 DIAGNOSIS — L821 Other seborrheic keratosis: Secondary | ICD-10-CM | POA: Diagnosis not present

## 2017-07-11 DIAGNOSIS — D225 Melanocytic nevi of trunk: Secondary | ICD-10-CM | POA: Diagnosis not present

## 2017-07-11 DIAGNOSIS — D1801 Hemangioma of skin and subcutaneous tissue: Secondary | ICD-10-CM | POA: Diagnosis not present

## 2017-07-19 DIAGNOSIS — H26492 Other secondary cataract, left eye: Secondary | ICD-10-CM | POA: Diagnosis not present

## 2017-07-23 DIAGNOSIS — Z23 Encounter for immunization: Secondary | ICD-10-CM | POA: Diagnosis not present

## 2017-07-31 ENCOUNTER — Encounter: Payer: Self-pay | Admitting: Cardiology

## 2017-07-31 DIAGNOSIS — E871 Hypo-osmolality and hyponatremia: Secondary | ICD-10-CM

## 2017-07-31 HISTORY — DX: Hypo-osmolality and hyponatremia: E87.1

## 2017-07-31 NOTE — Progress Notes (Signed)
Cardiology Office Note:    Date:  08/01/2017   ID:  Angel Gentry, DOB 07-27-1935, MRN [id number]  PCP:  Ronita Hipps, MD  Cardiologist:  Shirlee More, MD    Referring MD: Ronita Hipps, MD    ASSESSMENT:    1. Hypertensive heart disease with heart failure (HCC)   2. Persistent atrial fibrillation ([place])   3. Chronic diastolic heart failure ([place])   4. Coronary artery disease involving native coronary artery of native heart without angina pectoris   5. Dyslipidemia   6. Hyponatremia with decreased serum osmolality   7. Chronic diastolic congestive heart failure ([place])    PLAN:    In order of problems listed above:  1. Stable blood pressure at target heart failure compensated without fluid overload and continue her current diureticbeta blocker and ARB 2. Stable rate controlled continue beta blocker and anticoagulantat reduced dose with age greater than 80 body mass and 16 kg and elevated creatinine 3. Stable compensated continue current diuretic sodium restriction and home management 4. Stable after bypass surgery continue medical treatment and at this time I do not feel she requires an ischemia evaluation 5. Stable continue her statin, recent lipid profile scanned in the chart shows LDL at target and normal liver function 6. Stable recent sodium in range 7. Stable compensated on current diuretic   Next appointment: 3 months   Medication Adjustments/Labs and Tests Ordered: Current medicines are reviewed at length with the patient today.  Concerns regarding medicines are outlined above.  No orders of the defined types were placed in this encounter.  Meds ordered this encounter  Medications  . apixaban (ELIQUIS) 2.5 MG TABS tablet    Sig: Take 1 tablet (2.5 mg total) by mouth 2 (two) times daily.    Dispense:  60 tablet    Refill:  5    Chief Complaint  Patient presents with  . Follow-up    3 month flup appt  . Shortness of Breath    when walking up a hill   .  Fatigue  . Congestive Heart Failure    History of Present Illness:    Angel Gentry is a 82 y.o. female with a hx of CAD with CABG, CHF,  Persistent Atrial Fibrillation not anticoagulated with GI, GU and mucosal bleeding with apixaban,hypertension and hyperlipidemia last seen 6 months agoWeight stable 128-130 lbs . Compliance with diet, lifestyle and medications: yes Past Medical History:  Diagnosis Date  . CAD in native artery   . Dyslipidemia   . Essential hypertension   . Type 2 diabetes mellitus ([place])     Past Surgical History:  Procedure Laterality Date  . CAROTID ENDARTERECTOMY    . CATARACT EXTRACTION    . CORONARY ARTERY BYPASS GRAFT      Current Medications: Current Meds  Medication Sig  . anastrozole (ARIMIDEX) 1 MG tablet Take 1 mg by mouth daily.  Marland Kitchen apixaban (ELIQUIS) 2.5 MG TABS tablet Take 1 tablet (2.5 mg total) by mouth 2 (two) times daily.  . bumetanide (BUMEX) 1 MG tablet Take 1.5 tablets (1.5 mg total) by mouth 2 (two) times daily.  . fenofibrate micronized (LOFIBRA) 200 MG capsule Take 200 capsules by mouth daily.  Marland Kitchen levothyroxine (SYNTHROID, LEVOTHROID) 88 MCG tablet Take 88 mcg by mouth daily.  Marland Kitchen losartan (COZAAR) 50 MG tablet Take 50 mg by mouth daily.  . metoprolol succinate (TOPROL-XL) 50 MG 24 hr tablet Take 50 mg by mouth daily.  . Multiple Vitamin (  MULTIVITAMIN) capsule Take by mouth daily.  . nitroGLYCERIN (NITROSTAT) 0.4 MG SL tablet Place 0.4 mg under the tongue every 5 (five) minutes as needed for chest pain.   Marland Kitchen omeprazole (PRILOSEC) 20 MG capsule Take 20 mg by mouth daily.  . potassium chloride SA (K-DUR,KLOR-CON) 20 MEQ tablet Take 20 mEq by mouth daily.  . simvastatin (ZOCOR) 10 MG tablet Take 10 mg by mouth daily.  . Vitamin D, Ergocalciferol, (DRISDOL) 50000 units CAPS capsule TAKE ONE CAPSULE BY MOUTH EVERY TWO WEEKS  . [DISCONTINUED] apixaban (ELIQUIS) 5 MG TABS tablet Take 1 tablet (5 mg total) by mouth 2 (two) times daily.      Allergies:   Nabumetone and Sulfa antibiotics   Social History   Social History  . Marital status: Married    Spouse name: N/A  . Number of children: N/A  . Years of education: N/A   Social History Main Topics  . Smoking status: Never Smoker  . Smokeless tobacco: Never Used  . Alcohol use None  . Drug use: Unknown  . Sexual activity: Not Asked   Other Topics Concern  . None   Social History Narrative  . None     Family History: The patient's family history includes Cancer in her sister; Heart disease in her brother and mother. ROS:   Please see the history of present illness.    All other systems reviewed and are negative.  EKGs/Labs/Other Studies Reviewed:    The following studies were reviewed today:   Recent Labs: recent CBC CMP lipid profile normal or unchanged LDL at target scanned into the chart 05/01/2017: BNP CANCELED; BUN 30; Creatinine, Ser 1.05; Potassium 4.3; Sodium 138  Recent Lipid Panel No results found for: CHOL, TRIG, HDL, CHOLHDL, VLDL, LDLCALC, LDLDIRECT  Physical Exam:    VS:  BP (!) 152/94 (BP Location: Left Arm, Patient Position: Sitting)   Pulse 84   Ht 5' (1.524 m)   Wt 128 lb 12.8 oz (58.4 kg)   SpO2 98%   BMI 25.15 kg/m     Wt Readings from Last 3 Encounters:  08/01/17 128 lb 12.8 oz (58.4 kg)  05/01/17 131 lb (59.4 kg)     GEN:  Well nourished, well developed in no acute distress HEENT: Normal NECK: No JVD; No carotid bruits LYMPHATICS: No lymphadenopathy CARDIAC: Irr Irr no S3 variable s1  no murmurs, rubs, gallops RESPIRATORY:  Clear to auscultation without rales, wheezing or rhonchi  ABDOMEN: Soft, non-tender, non-distended MUSCULOSKELETAL:  No edema; No deformity  SKIN: Warm and dry NEUROLOGIC:  Alert and oriented x 3 PSYCHIATRIC:  Normal affect    Signed, Shirlee More, MD  08/01/2017 12:54 PM    [place]

## 2017-08-01 ENCOUNTER — Encounter: Payer: Self-pay | Admitting: Cardiology

## 2017-08-01 ENCOUNTER — Ambulatory Visit (INDEPENDENT_AMBULATORY_CARE_PROVIDER_SITE_OTHER): Payer: Medicare Other | Admitting: Cardiology

## 2017-08-01 VITALS — BP 140/80 | HR 84 | Ht 60.0 in | Wt 128.8 lb

## 2017-08-01 DIAGNOSIS — I5032 Chronic diastolic (congestive) heart failure: Secondary | ICD-10-CM | POA: Diagnosis not present

## 2017-08-01 DIAGNOSIS — I110 Hypertensive heart disease with heart failure: Secondary | ICD-10-CM

## 2017-08-01 DIAGNOSIS — E785 Hyperlipidemia, unspecified: Secondary | ICD-10-CM | POA: Diagnosis not present

## 2017-08-01 DIAGNOSIS — I481 Persistent atrial fibrillation: Secondary | ICD-10-CM

## 2017-08-01 DIAGNOSIS — I2510 Atherosclerotic heart disease of native coronary artery without angina pectoris: Secondary | ICD-10-CM | POA: Diagnosis not present

## 2017-08-01 DIAGNOSIS — E871 Hypo-osmolality and hyponatremia: Secondary | ICD-10-CM

## 2017-08-01 DIAGNOSIS — I4819 Other persistent atrial fibrillation: Secondary | ICD-10-CM

## 2017-08-01 MED ORDER — APIXABAN 2.5 MG PO TABS: 2.5000 mg | ORAL_TABLET | Freq: Two times a day (BID) | ORAL | 5 refills | Status: DC

## 2017-08-01 NOTE — Patient Instructions (Addendum)
Medication Instructions:  Your physician has recommended you make the following change in your medication: reduce Eliquis (see below)  STOP: Eliquis 5mg  one tablet twice daily START Eliquis 2.5mg  one tablet twice daily   Labwork: NONE  Testing/Procedures: NONE  Follow-Up: Your physician wants you to follow-up in: 3 months. You will receive a reminder letter in the mail two months in advance. If you don't receive a letter, please call our office to schedule the follow-up appointment.   Any Other Special Instructions Will Be Listed Below (If Applicable).     If you need a refill on your cardiac medications before your next appointment, please call your pharmacy.      Heart Failure  Weigh yourself every morning when you first wake up and record on a calender or note pad, bring this to your office visits. Using a pill tender can help with taking your medications consistently.  Limit your fluid intake to 2 liters daily  Limit your sodium intake to less than 2-3 grams daily. Ask if you need dietary teaching.  If you gain more than 3 pounds (from your dry weight ), double your dose of diuretic for the day.  If you gain more than 5 pounds (from your dry weight), double your dose of lasix and call your heart failure doctor.  Please do not smoke tobacco since it is very bad for your heart.  Please do not drink alcohol since it can worsen your heart failure.Also avoid OTC nonsteroidal drugs, such as advil, aleve and motrin.  Try to exercise for at least 30 minutes every day because this will help your heart be more efficient. You may be eligible for supervised cardiac rehab, ask your physician.

## 2017-09-27 DIAGNOSIS — Z853 Personal history of malignant neoplasm of breast: Secondary | ICD-10-CM | POA: Diagnosis not present

## 2017-09-27 DIAGNOSIS — Z923 Personal history of irradiation: Secondary | ICD-10-CM | POA: Diagnosis not present

## 2017-09-27 DIAGNOSIS — Z170 Estrogen receptor positive status [ER+]: Secondary | ICD-10-CM | POA: Diagnosis not present

## 2017-09-27 DIAGNOSIS — Z79811 Long term (current) use of aromatase inhibitors: Secondary | ICD-10-CM | POA: Diagnosis not present

## 2017-09-27 DIAGNOSIS — C50919 Malignant neoplasm of unspecified site of unspecified female breast: Secondary | ICD-10-CM | POA: Diagnosis not present

## 2017-10-03 DIAGNOSIS — R921 Mammographic calcification found on diagnostic imaging of breast: Secondary | ICD-10-CM | POA: Diagnosis not present

## 2017-10-03 DIAGNOSIS — Z9889 Other specified postprocedural states: Secondary | ICD-10-CM | POA: Diagnosis not present

## 2017-10-03 DIAGNOSIS — C50411 Malignant neoplasm of upper-outer quadrant of right female breast: Secondary | ICD-10-CM | POA: Diagnosis not present

## 2017-10-10 DIAGNOSIS — M8589 Other specified disorders of bone density and structure, multiple sites: Secondary | ICD-10-CM | POA: Diagnosis not present

## 2017-10-10 DIAGNOSIS — Z13820 Encounter for screening for osteoporosis: Secondary | ICD-10-CM | POA: Diagnosis not present

## 2017-10-11 ENCOUNTER — Other Ambulatory Visit: Payer: Self-pay

## 2017-10-11 MED ORDER — FENOFIBRATE MICRONIZED 200 MG PO CAPS: 200.0000 mg | ORAL_CAPSULE | Freq: Every day | ORAL | 3 refills | Status: DC

## 2017-11-09 NOTE — Progress Notes (Signed)
Cardiology Office Note:    Date:  11/12/2017   ID:  Angel Gentry, DOB 1935-01-21, MRN [id number]  PCP:  Ronita Hipps, MD  Cardiologist:  Shirlee More, MD    Referring MD: Ronita Hipps, MD    ASSESSMENT:    1. Chronic diastolic heart failure ([place])   2. Hypertensive heart disease with heart failure (HCC)   3. Persistent atrial fibrillation ([place])   4. Hx of CABG   5. Diarrhea, unspecified type    PLAN:    In order of problems listed above:  1. Stable compensated continue current diuretic beta-blocker ARB and recheck echocardiogram she is at risk for progressive left ventricular dysfunction with CAD and atrial fibrillation. 2. Stable continue current antihypertensives 3. Stable continue her beta-blocker and anticoagulant. 4. Stable on medical therapy, at this time I do not think she requires an ischemia evaluation 5. Likely due to her PPI will discontinue and place her on an H2 blocker.  If unimproved should stop fenofibrate.    Next appointment: 3 months   Medication Adjustments/Labs and Tests Ordered: Current medicines are reviewed at length with the patient today.  Concerns regarding medicines are outlined above.  Orders Placed This Encounter  Procedures  . ECHOCARDIOGRAM COMPLETE   Meds ordered this encounter  Medications  . ranitidine (ZANTAC) 150 MG capsule    Sig: Take 1 capsule (150 mg total) by mouth 2 (two) times daily.    Dispense:  30 capsule    Refill:  6    Chief Complaint  Patient presents with  . Follow-up    3 month    History of Present Illness:    Angel Gentry is a 83 y.o. female with a hx of  CAD with CABG, CHF,  Persistent Atrial Fibrillation not anticoagulated with GI, GU and mucosal bleeding with apixaban,hypertension and hyperlipidemia  last seen 3 months ago. Compliance with diet, lifestyle and medications: Yes Overall she is pleased with the quality of her life except for bothersome diarrhea.  She takes PPI long-term as well as a  fenofibrate and is at risk for diarrhea related to medications.  Heart failure is well compensated weight is stable heart rate and blood pressure in range and no shortness of breath chest pain palpitations syncope TIA or bleeding complication of her anti-coagulant. Past Medical History:  Diagnosis Date  . CAD in native artery   . Dyslipidemia   . Essential hypertension   . Type 2 diabetes mellitus ([place])     Past Surgical History:  Procedure Laterality Date  . CAROTID ENDARTERECTOMY    . CATARACT EXTRACTION    . CORONARY ARTERY BYPASS GRAFT      Current Medications: Current Meds  Medication Sig  . anastrozole (ARIMIDEX) 1 MG tablet Take 1 mg by mouth daily.  Marland Kitchen apixaban (ELIQUIS) 2.5 MG TABS tablet Take 1 tablet (2.5 mg total) by mouth 2 (two) times daily.  . bumetanide (BUMEX) 1 MG tablet Take 1.5 tablets (1.5 mg total) by mouth 2 (two) times daily.  . fenofibrate micronized (LOFIBRA) 200 MG capsule Take 1 capsule (200 mg total) by mouth daily.  Marland Kitchen levothyroxine (SYNTHROID, LEVOTHROID) 88 MCG tablet Take 88 mcg by mouth daily.  Marland Kitchen losartan (COZAAR) 50 MG tablet Take 50 mg by mouth daily.  . metoprolol succinate (TOPROL-XL) 50 MG 24 hr tablet Take 50 mg by mouth daily.  . Multiple Vitamin (MULTIVITAMIN) capsule Take by mouth daily.  . nitroGLYCERIN (NITROSTAT) 0.4 MG SL tablet Place 0.4  mg under the tongue every 5 (five) minutes as needed for chest pain.   . potassium chloride SA (K-DUR,KLOR-CON) 20 MEQ tablet Take 20 mEq by mouth daily.  . simvastatin (ZOCOR) 10 MG tablet Take 10 mg by mouth daily.  . Vitamin D, Ergocalciferol, (DRISDOL) 50000 units CAPS capsule TAKE ONE CAPSULE BY MOUTH EVERY TWO WEEKS  . [DISCONTINUED] omeprazole (PRILOSEC) 20 MG capsule Take 20 mg by mouth daily.     Allergies:   Nabumetone and Sulfa antibiotics   Social History   Socioeconomic History  . Marital status: Married    Spouse name: None  . Number of children: None  . Years of education: None  .  Highest education level: None  Social Needs  . Financial resource strain: None  . Food insecurity - worry: None  . Food insecurity - inability: None  . Transportation needs - medical: None  . Transportation needs - non-medical: None  Occupational History  . None  Tobacco Use  . Smoking status: Never Smoker  . Smokeless tobacco: Never Used  Substance and Sexual Activity  . Alcohol use: None  . Drug use: None  . Sexual activity: None  Other Topics Concern  . None  Social History Narrative  . None     Family History: The patient's family history includes Cancer in her sister; Heart disease in her brother and mother. ROS:   Please see the history of present illness.    All other systems reviewed and are negative.  EKGs/Labs/Other Studies Reviewed:    The following studies were reviewed today  Recent Labs: 05/01/2017: BNP CANCELED; BUN 30; Creatinine, Ser 1.05; Potassium 4.3; Sodium 138  Recent Lipid Panel No results found for: CHOL, TRIG, HDL, CHOLHDL, VLDL, LDLCALC, LDLDIRECT  Physical Exam:    VS:  BP 134/82 (BP Location: Right Arm, Patient Position: Sitting, Cuff Size: Normal)   Pulse 81   Ht 5' (1.524 m)   Wt 129 lb (58.5 kg)   SpO2 97%   BMI 25.19 kg/m     Wt Readings from Last 3 Encounters:  11/12/17 129 lb (58.5 kg)  08/01/17 128 lb 12.8 oz (58.4 kg)  05/01/17 131 lb (59.4 kg)     GEN:  Well nourished, well developed in no acute distress HEENT: Normal NECK: No JVD; No carotid bruits LYMPHATICS: No lymphadenopathy CARDIAC: Irregular irregular variable first heart sound  no murmurs, rubs, gallops RESPIRATORY:  Clear to auscultation without rales, wheezing or rhonchi  ABDOMEN: Soft, non-tender, non-distended MUSCULOSKELETAL:  No edema; No deformity  SKIN: Warm and dry NEUROLOGIC:  Alert and oriented x 3 PSYCHIATRIC:  Normal affect    Signed, Shirlee More, MD  11/12/2017 11:37 AM    [place]

## 2017-11-12 ENCOUNTER — Encounter: Payer: Self-pay | Admitting: Cardiology

## 2017-11-12 ENCOUNTER — Ambulatory Visit (INDEPENDENT_AMBULATORY_CARE_PROVIDER_SITE_OTHER): Payer: Medicare Other | Admitting: Cardiology

## 2017-11-12 VITALS — BP 134/82 | HR 81 | Ht 60.0 in | Wt 129.0 lb

## 2017-11-12 DIAGNOSIS — I5032 Chronic diastolic (congestive) heart failure: Secondary | ICD-10-CM | POA: Diagnosis not present

## 2017-11-12 DIAGNOSIS — I110 Hypertensive heart disease with heart failure: Secondary | ICD-10-CM

## 2017-11-12 DIAGNOSIS — Z951 Presence of aortocoronary bypass graft: Secondary | ICD-10-CM | POA: Diagnosis not present

## 2017-11-12 DIAGNOSIS — I481 Persistent atrial fibrillation: Secondary | ICD-10-CM

## 2017-11-12 DIAGNOSIS — R197 Diarrhea, unspecified: Secondary | ICD-10-CM

## 2017-11-12 DIAGNOSIS — I4819 Other persistent atrial fibrillation: Secondary | ICD-10-CM

## 2017-11-12 MED ORDER — RANITIDINE HCL 150 MG PO CAPS: 150.0000 mg | ORAL_CAPSULE | Freq: Two times a day (BID) | ORAL | 6 refills | Status: DC

## 2017-11-12 NOTE — Patient Instructions (Addendum)
Medication Instructions:  Your physician has recommended you make the following change in your medication:  STOP prilosec START ranitidine (Zantac) 150 mg twice daily  Labwork: None  Testing/Procedures: Your physician has requested that you have an echocardiogram. Echocardiography is a painless test that uses sound waves to create images of your heart. It provides your doctor with information about the size and shape of your heart and how well your heart's chambers and valves are working. This procedure takes approximately one hour. There are no restrictions for this procedure.  Follow-Up: Your physician recommends that you schedule a follow-up appointment in: 3 months.  Any Other Special Instructions Will Be Listed Below (If Applicable).     If you need a refill on your cardiac medications before your next appointment, please call your pharmacy.    Heart Failure  Weigh yourself every morning when you first wake up and record on a calender or note pad, bring this to your office visits. Using a pill tender can help with taking your medications consistently.  Limit your fluid intake to 2 liters daily  Limit your sodium intake to less than 2-3 grams daily. Ask if you need dietary teaching.  If you gain more than 3 pounds (from your dry weight ), double your dose of diuretic for the day.  If you gain more than 5 pounds (from your dry weight), double your dose of lasix and call your heart failure doctor.  Please do not smoke tobacco since it is very bad for your heart.  Please do not drink alcohol since it can worsen your heart failure.Also avoid OTC nonsteroidal drugs, such as advil, aleve and motrin.  Try to exercise for at least 30 minutes every day because this will help your heart be more efficient. You may be eligible for supervised cardiac rehab, ask your physician.

## 2017-11-26 DIAGNOSIS — E039 Hypothyroidism, unspecified: Secondary | ICD-10-CM | POA: Diagnosis not present

## 2017-11-26 DIAGNOSIS — Z79899 Other long term (current) drug therapy: Secondary | ICD-10-CM | POA: Diagnosis not present

## 2017-11-26 DIAGNOSIS — E119 Type 2 diabetes mellitus without complications: Secondary | ICD-10-CM | POA: Diagnosis not present

## 2017-11-29 DIAGNOSIS — Z6825 Body mass index (BMI) 25.0-25.9, adult: Secondary | ICD-10-CM | POA: Diagnosis not present

## 2017-11-29 DIAGNOSIS — E039 Hypothyroidism, unspecified: Secondary | ICD-10-CM | POA: Diagnosis not present

## 2017-11-29 DIAGNOSIS — E119 Type 2 diabetes mellitus without complications: Secondary | ICD-10-CM | POA: Diagnosis not present

## 2017-11-29 DIAGNOSIS — I10 Essential (primary) hypertension: Secondary | ICD-10-CM | POA: Diagnosis not present

## 2017-12-10 ENCOUNTER — Ambulatory Visit (HOSPITAL_BASED_OUTPATIENT_CLINIC_OR_DEPARTMENT_OTHER)
Admission: RE | Admit: 2017-12-10 | Discharge: 2017-12-10 | Disposition: A | Discharge status: Home / Self Care | Payer: Medicare Other | Source: Ambulatory Visit | Attending: Cardiology | Admitting: Cardiology

## 2017-12-10 DIAGNOSIS — I481 Persistent atrial fibrillation: Secondary | ICD-10-CM | POA: Insufficient documentation

## 2017-12-10 DIAGNOSIS — E785 Hyperlipidemia, unspecified: Secondary | ICD-10-CM | POA: Insufficient documentation

## 2017-12-10 DIAGNOSIS — I083 Combined rheumatic disorders of mitral, aortic and tricuspid valves: Secondary | ICD-10-CM | POA: Insufficient documentation

## 2017-12-10 DIAGNOSIS — I5032 Chronic diastolic (congestive) heart failure: Secondary | ICD-10-CM | POA: Diagnosis not present

## 2017-12-10 DIAGNOSIS — I4819 Other persistent atrial fibrillation: Secondary | ICD-10-CM

## 2017-12-10 DIAGNOSIS — I110 Hypertensive heart disease with heart failure: Secondary | ICD-10-CM | POA: Diagnosis not present

## 2017-12-10 DIAGNOSIS — Z951 Presence of aortocoronary bypass graft: Secondary | ICD-10-CM | POA: Insufficient documentation

## 2017-12-10 NOTE — Progress Notes (Signed)
Echocardiogram 2D Echocardiogram has been performed.  Angel Gentry 12/10/2017, 3:49 PM

## 2017-12-21 ENCOUNTER — Telehealth: Payer: Self-pay

## 2017-12-21 NOTE — Telephone Encounter (Signed)
Patient informed of results.  

## 2017-12-21 NOTE — Telephone Encounter (Signed)
-----   Message from Richardo Priest, MD sent at 12/21/2017  9:46 AM EST ----- Echo is stable, EF normal ----- Message ----- From: Jossie Ng, RN Sent: 12/14/2017  11:34 AM To: Richardo Priest, MD  Please advise on result.

## 2017-12-31 ENCOUNTER — Telehealth: Payer: Self-pay | Admitting: Cardiology

## 2017-12-31 DIAGNOSIS — E785 Hyperlipidemia, unspecified: Secondary | ICD-10-CM

## 2017-12-31 NOTE — Telephone Encounter (Signed)
Advised patient to stop fenofibrate. Advised patient to have repeat lipid profile in 1 month at the [facility] in [place]. Advised patient to be fasting. Patient verbalized understanding. No further questions.

## 2017-12-31 NOTE — Telephone Encounter (Signed)
Patient states at last office visit she had complaints of diarrhea. Patient states she was switched from Prilosec to Zantac. Patient states she is still having diarrhea and that Dr. Bettina Gavia mentioned it could be fenofibrate. Please advise if there is a substitute for fenofibrate.

## 2017-12-31 NOTE — Addendum Note (Signed)
Addended by: Warner Mccreedy E on: 12/31/2017 11:10 AM   Modules accepted: Orders

## 2017-12-31 NOTE — Telephone Encounter (Signed)
Would stop and do lipid profile 1 month

## 2017-12-31 NOTE — Telephone Encounter (Signed)
Just needs to ask a question about her medicines

## 2018-01-30 ENCOUNTER — Other Ambulatory Visit: Payer: Self-pay

## 2018-01-30 DIAGNOSIS — I5032 Chronic diastolic (congestive) heart failure: Secondary | ICD-10-CM

## 2018-01-30 MED ORDER — APIXABAN 2.5 MG PO TABS: 2.5000 mg | ORAL_TABLET | Freq: Two times a day (BID) | ORAL | 5 refills | Status: DC

## 2018-01-30 NOTE — Telephone Encounter (Signed)
Rx for Eliquis 2.5mg  one tablet BID sent to [facility]

## 2018-01-31 DIAGNOSIS — E785 Hyperlipidemia, unspecified: Secondary | ICD-10-CM | POA: Diagnosis not present

## 2018-02-01 ENCOUNTER — Other Ambulatory Visit: Payer: Self-pay

## 2018-02-01 LAB — LIPID PANEL
Chol/HDL Ratio: 3.6 ratio (ref 0.0–4.4)
Cholesterol, Total: 178 mg/dL (ref 100–199)
HDL: 49 mg/dL (ref >39)
LDL CALC: 96 mg/dL (ref 0–99)
TRIGLYCERIDES: 165 mg/dL — AB (ref 0–149)
VLDL CHOLESTEROL CAL: 33 mg/dL (ref 5–40)

## 2018-02-01 MED ORDER — RANITIDINE HCL 150 MG PO CAPS: 150.0000 mg | ORAL_CAPSULE | Freq: Two times a day (BID) | ORAL | 3 refills | Status: DC

## 2018-02-10 DIAGNOSIS — I340 Nonrheumatic mitral (valve) insufficiency: Secondary | ICD-10-CM

## 2018-02-10 HISTORY — DX: Nonrheumatic mitral (valve) insufficiency: I34.0

## 2018-02-10 NOTE — Progress Notes (Signed)
Cardiology Office Note:    Date:  02/11/2018   ID:  Angel Gentry, DOB [birth year], MRN [id number]  PCP:  Ronita Hipps, MD  Cardiologist:  Shirlee More, MD    Referring MD: Ronita Hipps, MD    ASSESSMENT:    1. Chronic diastolic heart failure ([place])   2. Hypertensive heart disease with heart failure ([place])   3. Chronic atrial fibrillation ([place])   4. Non-rheumatic mitral regurgitation    PLAN:    In order of problems listed above:  1. Stable New York Heart Association class I to class II no evidence of fluid overload continue her current medical treatment including loop diuretic beta-blocker and self-management.  Recent echocardiogram shows preservation of ejection fraction 2. Stable blood pressure target continue current treatment including ARB 3. Stable rate controlled continue current treatment beta-blocker and reduced dose anticoagulant 4. Stable clinically.  I do not see any indication to consider intervention.   Next appointment: 4 months   Medication Adjustments/Labs and Tests Ordered: Current medicines are reviewed at length with the patient today.  Concerns regarding medicines are outlined above.  Orders Placed This Encounter  Procedures  . Basic metabolic panel  . Pro b natriuretic peptide (BNP)   No orders of the defined types were placed in this encounter.   Chief Complaint  Patient presents with  . Follow-up    3 month follow up   . Congestive Heart Failure  . Hypertension  . Atrial Fibrillation  . Coronary Artery Disease    History of Present Illness:    Angel Gentry is a 83 y.o. female with a hx of CADwith CABG, CHF,PersistentAtrial Fibrillationnot anticoagulated with GI, GU and mucosal bleeding with apixaban,hypertension and hyperlipidemia  last seen 11/12/17. Weight BP and HR are stable at home and she has had no SOB, edema, chest pain, palpitation or bledding with her anticoagulant. Diarrhea is improved off fenofibrate. ASSESSMENT:     11/12/17   1. Chronic diastolic heart failure ([place])   2. Hypertensive heart disease with heart failure ([facility])   3. Persistent atrial fibrillation ([place])   4. Hx of CABG   5. Diarrhea, unspecified type    PLAN:    1.   Stable compensated continue current diuretic beta-blocker ARB and recheck echocardiogram she is at risk for progressive left ventricular dysfunction with CAD and atrial fibrillation. 5. Stable continue current antihypertensives 6. Stable continue her beta-blocker and anticoagulant. 7. Stable on medical therapy, at this time I do not think she requires an ischemia evaluation 8. Likely due to her PPI will discontinue and place her on an H2 blocker.  If unimproved should stop fenofibrate.  Compliance with diet, lifestyle and medications: Yes She is pleased with the quality of her life is had no symptoms from heart failure and diarrhea improved with strong fenofibrate.  Follow-up lipid profile showed triglycerides mildly elevated at 165 and I would not put her on a second agent along with her statin. Past Medical History:  Diagnosis Date  . CAD in native artery   . Dyslipidemia   . Essential hypertension   . Type 2 diabetes mellitus ([place])     Past Surgical History:  Procedure Laterality Date  . CAROTID ENDARTERECTOMY    . CATARACT EXTRACTION    . CORONARY ARTERY BYPASS GRAFT      Current Medications: Current Meds  Medication Sig  . anastrozole (ARIMIDEX) 1 MG tablet Take 1 mg by mouth daily.  Marland Kitchen apixaban (ELIQUIS) 2.5 MG TABS  tablet Take 1 tablet (2.5 mg total) by mouth 2 (two) times daily.  . bumetanide (BUMEX) 1 MG tablet Take 1.5 tablets (1.5 mg total) by mouth 2 (two) times daily.  Marland Kitchen levothyroxine (SYNTHROID, LEVOTHROID) 88 MCG tablet Take 88 mcg by mouth daily.  Marland Kitchen losartan (COZAAR) 50 MG tablet Take 50 mg by mouth daily.  . metoprolol succinate (TOPROL-XL) 50 MG 24 hr tablet Take 50 mg by mouth daily.  . Multiple Vitamin (MULTIVITAMIN) capsule Take by mouth  daily.  . nitroGLYCERIN (NITROSTAT) 0.4 MG SL tablet Place 0.4 mg under the tongue every 5 (five) minutes as needed for chest pain.   . potassium chloride SA (K-DUR,KLOR-CON) 20 MEQ tablet Take 20 mEq by mouth daily.  . ranitidine (ZANTAC) 150 MG capsule Take 1 capsule (150 mg total) by mouth 2 (two) times daily.  . simvastatin (ZOCOR) 10 MG tablet Take 10 mg by mouth daily.  . Vitamin D, Ergocalciferol, (DRISDOL) 50000 units CAPS capsule TAKE ONE CAPSULE BY MOUTH EVERY TWO WEEKS     Allergies:   Nabumetone and Sulfa antibiotics   Social History   Socioeconomic History  . Marital status: Married    Spouse name: Not on file  . Number of children: Not on file  . Years of education: Not on file  . Highest education level: Not on file  Occupational History  . Not on file  Social Needs  . Financial resource strain: Not on file  . Food insecurity:    Worry: Not on file    Inability: Not on file  . Transportation needs:    Medical: Not on file    Non-medical: Not on file  Tobacco Use  . Smoking status: Never Smoker  . Smokeless tobacco: Never Used  Substance and Sexual Activity  . Alcohol use: Not Currently  . Drug use: Never  . Sexual activity: Not on file  Lifestyle  . Physical activity:    Days per week: Not on file    Minutes per session: Not on file  . Stress: Not on file  Relationships  . Social connections:    Talks on phone: Not on file    Gets together: Not on file    Attends religious service: Not on file    Active member of club or organization: Not on file    Attends meetings of clubs or organizations: Not on file    Relationship status: Not on file  Other Topics Concern  . Not on file  Social History Narrative  . Not on file     Family History: The patient's family history includes Cancer in her sister; Heart disease in her brother and mother. ROS:   Please see the history of present illness.    All other systems reviewed and are  negative.  EKGs/Labs/Other Studies Reviewed:    The following studies were reviewed today:  Echo TTE 12/10/17:Study Conclusions - Left ventricle: The cavity size was normal. Systolic function was   normal. The estimated ejection fraction was in the range of 55%   to 60%. Wall motion was normal; there were no regional wall   motion abnormalities. - Aortic valve: There was mild regurgitation.- Ascending aorta: The ascending aorta was moderately dilated. - Mitral valve: Moderately calcified annulus.There was moderate regurgitation. - Left atrium: The atrium was moderately dilated. - Right ventricle: The cavity size was mildly dilated. Wall  thickness was normal. Systolic function was mildly reduced. - Right atrium: The atrium was severely dilated. - Tricuspid valve:  There was moderate regurgitation. - Pulmonary arteries: PA peak pressure: 48 mm Hg (S).  Recent Labs: 05/01/2017: BNP CANCELED; BUN 30; Creatinine, Ser 1.05; Potassium 4.3; Sodium 138  Recent Lipid Panel    Component Value Date/Time   CHOL 178 01/31/2018 0814   TRIG 165 (H) 01/31/2018 0814   HDL 49 01/31/2018 0814   CHOLHDL 3.6 01/31/2018 0814   LDLCALC 96 01/31/2018 0814    Physical Exam:    VS:  BP (!) 154/84 (BP Location: Right Arm, Patient Position: Sitting, Cuff Size: Normal)   Pulse 77   Ht 5' (1.524 m)   Wt 130 lb 6.4 oz (59.1 kg)   SpO2 98%   BMI 25.47 kg/m     Wt Readings from Last 3 Encounters:  02/11/18 130 lb 6.4 oz (59.1 kg)  11/12/17 129 lb (58.5 kg)  08/01/17 128 lb 12.8 oz (58.4 kg)     GEN:  Well nourished, well developed in no acute distress HEENT: Normal NECK: No JVD; No carotid bruits LYMPHATICS: No lymphadenopathy CARDIAC: Irr Irr variable s1 1-2/6 MR apex RESPIRATORY:  Clear to auscultation without rales, wheezing or rhonchi  ABDOMEN: Soft, non-tender, non-distended MUSCULOSKELETAL:  No edema; No deformity  SKIN: Warm and dry NEUROLOGIC:  Alert and oriented x 3 PSYCHIATRIC:  Normal  affect    Signed, Shirlee More, MD  02/11/2018 10:50 AM    [place]

## 2018-02-11 ENCOUNTER — Ambulatory Visit (INDEPENDENT_AMBULATORY_CARE_PROVIDER_SITE_OTHER): Payer: Medicare Other | Admitting: Cardiology

## 2018-02-11 ENCOUNTER — Encounter: Payer: Self-pay | Admitting: Cardiology

## 2018-02-11 VITALS — BP 154/84 | HR 77 | Ht 60.0 in | Wt 130.4 lb

## 2018-02-11 DIAGNOSIS — I340 Nonrheumatic mitral (valve) insufficiency: Secondary | ICD-10-CM | POA: Diagnosis not present

## 2018-02-11 DIAGNOSIS — I110 Hypertensive heart disease with heart failure: Secondary | ICD-10-CM

## 2018-02-11 DIAGNOSIS — I5032 Chronic diastolic (congestive) heart failure: Secondary | ICD-10-CM | POA: Diagnosis not present

## 2018-02-11 DIAGNOSIS — I482 Chronic atrial fibrillation, unspecified: Secondary | ICD-10-CM

## 2018-02-11 NOTE — Patient Instructions (Addendum)
Medication Instructions:  Your physician recommends that you continue on your current medications as directed. Please refer to the Current Medication list given to you today.  Labwork: Your physician recommends that you have the following labs drawn: BMP, BNP  Testing/Procedures: None  Follow-Up: Your physician wants you to follow-up in: 4 months. You will receive a reminder letter in the mail two months in advance. If you don't receive a letter, please call our office to schedule the follow-up appointment.  Any Other Special Instructions Will Be Listed Below (If Applicable).     If you need a refill on your cardiac medications before your next appointment, please call your pharmacy.    Heart Failure  Weigh yourself every morning when you first wake up and record on a calender or note pad, bring this to your office visits. Using a pill tender can help with taking your medications consistently.  Limit your fluid intake to 2 liters daily  Limit your sodium intake to less than 2-3 grams daily. Ask if you need dietary teaching.  If you gain more than 3 pounds (from your dry weight ), double your dose of diuretic for the day.  If you gain more than 5 pounds (from your dry weight), double your dose of lasix and call your heart failure doctor.  Please do not smoke tobacco since it is very bad for your heart.  Please do not drink alcohol since it can worsen your heart failure.Also avoid OTC nonsteroidal drugs, such as advil, aleve and motrin.  Try to exercise for at least 30 minutes every day because this will help your heart be more efficient. You may be eligible for supervised cardiac rehab, ask your physician.

## 2018-02-12 LAB — BASIC METABOLIC PANEL WITH GFR
BUN/Creatinine Ratio: 24 (ref 12–28)
BUN: 27 mg/dL (ref 8–27)
CO2: 25 mmol/L (ref 20–29)
Calcium: 9.8 mg/dL (ref 8.7–10.3)
Chloride: 97 mmol/L (ref 96–106)
Creatinine, Ser: 1.12 mg/dL — ABNORMAL HIGH (ref 0.57–1.00)
GFR calc Af Amer: 54 mL/min/1.73 — ABNORMAL LOW
GFR calc non Af Amer: 47 mL/min/1.73 — ABNORMAL LOW
Glucose: 105 mg/dL — ABNORMAL HIGH (ref 65–99)
Potassium: 3.9 mmol/L (ref 3.5–5.2)
Sodium: 140 mmol/L (ref 134–144)

## 2018-02-12 LAB — PRO B NATRIURETIC PEPTIDE: NT-PRO BNP: 1299 pg/mL — AB (ref 0–738)

## 2018-03-27 DIAGNOSIS — C50919 Malignant neoplasm of unspecified site of unspecified female breast: Secondary | ICD-10-CM | POA: Diagnosis not present

## 2018-03-27 DIAGNOSIS — Z853 Personal history of malignant neoplasm of breast: Secondary | ICD-10-CM | POA: Diagnosis not present

## 2018-03-27 DIAGNOSIS — Z79811 Long term (current) use of aromatase inhibitors: Secondary | ICD-10-CM | POA: Diagnosis not present

## 2018-03-27 DIAGNOSIS — Z170 Estrogen receptor positive status [ER+]: Secondary | ICD-10-CM | POA: Diagnosis not present

## 2018-05-27 DIAGNOSIS — E039 Hypothyroidism, unspecified: Secondary | ICD-10-CM | POA: Diagnosis not present

## 2018-05-27 DIAGNOSIS — E785 Hyperlipidemia, unspecified: Secondary | ICD-10-CM | POA: Diagnosis not present

## 2018-05-27 DIAGNOSIS — E119 Type 2 diabetes mellitus without complications: Secondary | ICD-10-CM | POA: Diagnosis not present

## 2018-05-27 DIAGNOSIS — Z79899 Other long term (current) drug therapy: Secondary | ICD-10-CM | POA: Diagnosis not present

## 2018-06-24 DIAGNOSIS — E7800 Pure hypercholesterolemia, unspecified: Secondary | ICD-10-CM | POA: Diagnosis not present

## 2018-06-24 DIAGNOSIS — E119 Type 2 diabetes mellitus without complications: Secondary | ICD-10-CM | POA: Diagnosis not present

## 2018-06-24 DIAGNOSIS — E039 Hypothyroidism, unspecified: Secondary | ICD-10-CM | POA: Diagnosis not present

## 2018-06-24 DIAGNOSIS — Z0000 Encounter for general adult medical examination without abnormal findings: Secondary | ICD-10-CM | POA: Diagnosis not present

## 2018-07-12 ENCOUNTER — Ambulatory Visit (INDEPENDENT_AMBULATORY_CARE_PROVIDER_SITE_OTHER): Payer: Medicare Other | Admitting: Cardiology

## 2018-07-12 ENCOUNTER — Encounter: Payer: Self-pay | Admitting: Cardiology

## 2018-07-12 VITALS — BP 138/80 | HR 76 | Ht 60.0 in | Wt 133.0 lb

## 2018-07-12 DIAGNOSIS — E785 Hyperlipidemia, unspecified: Secondary | ICD-10-CM | POA: Diagnosis not present

## 2018-07-12 DIAGNOSIS — I110 Hypertensive heart disease with heart failure: Secondary | ICD-10-CM

## 2018-07-12 DIAGNOSIS — I5032 Chronic diastolic (congestive) heart failure: Secondary | ICD-10-CM | POA: Diagnosis not present

## 2018-07-12 DIAGNOSIS — I482 Chronic atrial fibrillation, unspecified: Secondary | ICD-10-CM

## 2018-07-12 DIAGNOSIS — I2510 Atherosclerotic heart disease of native coronary artery without angina pectoris: Secondary | ICD-10-CM | POA: Diagnosis not present

## 2018-07-12 DIAGNOSIS — I480 Paroxysmal atrial fibrillation: Secondary | ICD-10-CM | POA: Diagnosis not present

## 2018-07-12 MED ORDER — BUMETANIDE 1 MG PO TABS: ORAL_TABLET | ORAL | 1 refills | Status: DC

## 2018-07-12 NOTE — Patient Instructions (Addendum)
Medication Instructions:  Your physician has recommended you make the following change in your medication:   INCREASE bumetanide (bumex): take 2 tablets (2 mg) in the morning and 1.5 tablets (1.5 mg) in the evening daily  Labwork: None  Testing/Procedures: You had an EKG today.   Follow-Up: Your physician wants you to follow-up in: 6 months. You will receive a reminder letter in the mail two months in advance. If you don't receive a letter, please call our office to schedule the follow-up appointment.   If you need a refill on your cardiac medications before your next appointment, please call your pharmacy.   Thank you for choosing [facility]! Robyne Peers, RN [phone]    Heart Failure  Weigh yourself every morning when you first wake up and record on a calender or note pad, bring this to your office visits. Using a pill tender can help with taking your medications consistently.  Limit your fluid intake to 2 liters daily  Limit your sodium intake to less than 2-3 grams daily. Ask if you need dietary teaching.  If you gain more than 3 pounds (from your dry weight ), double your dose of diuretic for the day.  If you gain more than 5 pounds (from your dry weight), double your dose of lasix and call your heart failure doctor.  Please do not smoke tobacco since it is very bad for your heart.  Please do not drink alcohol since it can worsen your heart failure.Also avoid OTC nonsteroidal drugs, such as advil, aleve and motrin.  Try to exercise for at least 30 minutes every day because this will help your heart be more efficient. You may be eligible for supervised cardiac rehab, ask your physician.

## 2018-07-12 NOTE — Progress Notes (Signed)
Cardiology Office Note:    Date:  07/12/2018   ID:  Angel Gentry, DOB Jul 07, 1935, MRN [id number]  PCP:  Ronita Hipps, MD  Cardiologist:  Shirlee More, MD    Referring MD: Ronita Hipps, MD    ASSESSMENT:    1. Coronary artery disease involving native coronary artery of native heart without angina pectoris   2. Hypertensive heart disease with heart failure ([place])   3. Chronic atrial fibrillation ([place])   4. Dyslipidemia   5. Chronic diastolic congestive heart failure ([place])   6. PAF (paroxysmal atrial fibrillation) (H[facility])    PLAN:    In order of problems listed above:  1. Stable chronic having no angina New York Heart Association class I.  Continue medical treatment at this time does not require an ischemia evaluation 2. Her weight is up 1 to 2 pounds attributed to no longer having diarrhea after stopping fenofibrate.  Will subtle increase the dose of her diuretic and she will contact me if she does not return to baseline.  Her heart failure is compensated New York Heart Association class I to class II but she does have evidence of edema will increase her diuretic. 3. Stable rate controlled continue her current anticoagulant reduced dose 4. Stable continue her statin lipids are at target 5. See #2 6. She remains in rate controlled atrial fibrillation continue anticoagulation   Next appointment: 6 months   Medication Adjustments/Labs and Tests Ordered: Current medicines are reviewed at length with the patient today.  Concerns regarding medicines are outlined above.  Orders Placed This Encounter  Procedures  . EKG 12-Lead   Meds ordered this encounter  Medications  . bumetanide (BUMEX) 1 MG tablet    Sig: Take 2 tablets (2 mg) in the morning and 1.5 tablets (1.5 mg) in the evening daily.    Dispense:  315 tablet    Refill:  1    No chief complaint on file.   History of Present Illness:    Angel Gentry is a 83 y.o. female with a hx of CAD with CABG, CHF,  Persistent  Atrial Fibrillation not anticoagulated with GI, GU and mucosal bleeding with apixaban, hypertension and hyperlipidemia  last seen 02/11/18. Compliance with diet, lifestyle and medications: Yes  Her weight is up 1/2 to 2 pounds but she clearly is better her chronic diarrhea improved after we discontinued her fenofibrate.  She is mildly short of breath walking uphill longer distances but is able to do all of her activities although she has +1 edema she was unaware of it she has no orthopnea chest pain palpitation or syncope.  Recent labs that she handcarried to my office show normal CMP creatinine 0.9 sodium 142 creatinine is normal potassium 4.4 cholesterol 165 HDL 41 LDL 84 TSH is normal. Past Medical History:  Diagnosis Date  . CAD in native artery   . Dyslipidemia   . Essential hypertension   . Type 2 diabetes mellitus ([place])     Past Surgical History:  Procedure Laterality Date  . CAROTID ENDARTERECTOMY    . CATARACT EXTRACTION    . CORONARY ARTERY BYPASS GRAFT      Current Medications: Current Meds  Medication Sig  . anastrozole (ARIMIDEX) 1 MG tablet Take 1 mg by mouth daily.  Marland Kitchen apixaban (ELIQUIS) 2.5 MG TABS tablet Take 1 tablet (2.5 mg total) by mouth 2 (two) times daily.  . bumetanide (BUMEX) 1 MG tablet Take 2 tablets (2 mg) in the morning and  1.5 tablets (1.5 mg) in the evening daily.  Marland Kitchen levothyroxine (SYNTHROID, LEVOTHROID) 88 MCG tablet Take 88 mcg by mouth daily.  Marland Kitchen losartan (COZAAR) 50 MG tablet Take 50 mg by mouth daily.  . metoprolol succinate (TOPROL-XL) 50 MG 24 hr tablet Take 50 mg by mouth daily.  . Multiple Vitamin (MULTIVITAMIN) capsule Take by mouth daily.  . nitroGLYCERIN (NITROSTAT) 0.4 MG SL tablet Place 0.4 mg under the tongue every 5 (five) minutes as needed for chest pain.   . potassium chloride SA (K-DUR,KLOR-CON) 20 MEQ tablet Take 20 mEq by mouth daily.  . ranitidine (ZANTAC) 150 MG capsule Take 1 capsule (150 mg total) by mouth 2 (two) times daily.  .  simvastatin (ZOCOR) 10 MG tablet Take 10 mg by mouth daily.  . Vitamin D, Ergocalciferol, (DRISDOL) 50000 units CAPS capsule TAKE ONE CAPSULE BY MOUTH EVERY TWO WEEKS  . [DISCONTINUED] bumetanide (BUMEX) 1 MG tablet Take 1.5 tablets (1.5 mg total) by mouth 2 (two) times daily.     Allergies:   Nabumetone and Sulfa antibiotics   Social History   Socioeconomic History  . Marital status: Married    Spouse name: Not on file  . Number of children: Not on file  . Years of education: Not on file  . Highest education level: Not on file  Occupational History  . Not on file  Social Needs  . Financial resource strain: Not on file  . Food insecurity:    Worry: Not on file    Inability: Not on file  . Transportation needs:    Medical: Not on file    Non-medical: Not on file  Tobacco Use  . Smoking status: Never Smoker  . Smokeless tobacco: Never Used  Substance and Sexual Activity  . Alcohol use: Not Currently  . Drug use: Never  . Sexual activity: Not on file  Lifestyle  . Physical activity:    Days per week: Not on file    Minutes per session: Not on file  . Stress: Not on file  Relationships  . Social connections:    Talks on phone: Not on file    Gets together: Not on file    Attends religious service: Not on file    Active member of club or organization: Not on file    Attends meetings of clubs or organizations: Not on file    Relationship status: Not on file  Other Topics Concern  . Not on file  Social History Narrative  . Not on file     Family History: The patient's family history includes Cancer in her sister; Heart disease in her brother and mother. ROS:   Please see the history of present illness.    All other systems reviewed and are negative.  EKGs/Labs/Other Studies Reviewed:    The following studies were reviewed today:  EKG:  EKG ordered today.  The ekg ordered today demonstrates atrial fibrillation controlled ventricular rates  Recent  Labs: 02/11/2018: BUN 27; Creatinine, Ser 1.12; NT-Pro BNP 1,299; Potassium 3.9; Sodium 140  Recent Lipid Panel    Component Value Date/Time   CHOL 178 01/31/2018 0814   TRIG 165 (H) 01/31/2018 0814   HDL 49 01/31/2018 0814   CHOLHDL 3.6 01/31/2018 0814   LDLCALC 96 01/31/2018 0814    Physical Exam:    VS:  BP 138/80 (BP Location: Right Arm, Patient Position: Sitting, Cuff Size: Small)   Pulse 76   Ht 5' (1.524 m)   Wt 133 lb (60.3  kg)   SpO2 98%   BMI 25.97 kg/m     Wt Readings from Last 3 Encounters:  07/12/18 133 lb (60.3 kg)  02/11/18 130 lb 6.4 oz (59.1 kg)  11/12/17 129 lb (58.5 kg)     GEN:  Well nourished, well developed in no acute distress HEENT: Normal NECK: No JVD; No carotid bruits LYMPHATICS: No lymphadenopathy CARDIAC: Irregular rhythm variable first heart sound no murmurs, rubs, gallops RESPIRATORY:  Clear to auscultation without rales, wheezing or rhonchi  ABDOMEN: Soft, non-tender, non-distended MUSCULOSKELETAL: 1+ bilateral to the knee edema; No deformity  SKIN: Warm and dry NEUROLOGIC:  Alert and oriented x 3 PSYCHIATRIC:  Normal affect    Signed, Shirlee More, MD  07/12/2018 3:07 PM    [facility]

## 2018-07-16 DIAGNOSIS — L821 Other seborrheic keratosis: Secondary | ICD-10-CM | POA: Diagnosis not present

## 2018-07-16 DIAGNOSIS — D485 Neoplasm of uncertain behavior of skin: Secondary | ICD-10-CM | POA: Diagnosis not present

## 2018-07-16 DIAGNOSIS — D1801 Hemangioma of skin and subcutaneous tissue: Secondary | ICD-10-CM | POA: Diagnosis not present

## 2018-07-18 DIAGNOSIS — Z23 Encounter for immunization: Secondary | ICD-10-CM | POA: Diagnosis not present

## 2018-07-31 ENCOUNTER — Telehealth: Payer: Self-pay

## 2018-07-31 DIAGNOSIS — I5032 Chronic diastolic (congestive) heart failure: Secondary | ICD-10-CM

## 2018-07-31 MED ORDER — APIXABAN 2.5 MG PO TABS: 2.5000 mg | ORAL_TABLET | Freq: Two times a day (BID) | ORAL | 5 refills | Status: DC

## 2018-07-31 NOTE — Telephone Encounter (Signed)
Rx sent to pharmacy as requested.

## 2018-09-30 DIAGNOSIS — Z853 Personal history of malignant neoplasm of breast: Secondary | ICD-10-CM | POA: Diagnosis not present

## 2018-09-30 DIAGNOSIS — Z170 Estrogen receptor positive status [ER+]: Secondary | ICD-10-CM

## 2018-09-30 DIAGNOSIS — Z79811 Long term (current) use of aromatase inhibitors: Secondary | ICD-10-CM

## 2018-09-30 DIAGNOSIS — C50919 Malignant neoplasm of unspecified site of unspecified female breast: Secondary | ICD-10-CM

## 2018-10-10 DIAGNOSIS — C50411 Malignant neoplasm of upper-outer quadrant of right female breast: Secondary | ICD-10-CM | POA: Diagnosis not present

## 2018-10-10 DIAGNOSIS — R922 Inconclusive mammogram: Secondary | ICD-10-CM | POA: Diagnosis not present

## 2018-12-17 DIAGNOSIS — Z79899 Other long term (current) drug therapy: Secondary | ICD-10-CM | POA: Diagnosis not present

## 2018-12-17 DIAGNOSIS — E785 Hyperlipidemia, unspecified: Secondary | ICD-10-CM | POA: Diagnosis not present

## 2018-12-17 DIAGNOSIS — E039 Hypothyroidism, unspecified: Secondary | ICD-10-CM | POA: Diagnosis not present

## 2018-12-17 DIAGNOSIS — E119 Type 2 diabetes mellitus without complications: Secondary | ICD-10-CM | POA: Diagnosis not present

## 2018-12-23 DIAGNOSIS — E7800 Pure hypercholesterolemia, unspecified: Secondary | ICD-10-CM | POA: Diagnosis not present

## 2018-12-23 DIAGNOSIS — E119 Type 2 diabetes mellitus without complications: Secondary | ICD-10-CM | POA: Diagnosis not present

## 2018-12-23 DIAGNOSIS — Z6825 Body mass index (BMI) 25.0-25.9, adult: Secondary | ICD-10-CM | POA: Diagnosis not present

## 2018-12-23 DIAGNOSIS — E039 Hypothyroidism, unspecified: Secondary | ICD-10-CM | POA: Diagnosis not present

## 2019-01-24 ENCOUNTER — Telehealth: Payer: Self-pay | Admitting: Cardiology

## 2019-01-24 NOTE — Telephone Encounter (Signed)
Cardiac Questionnaire:    Since your last visit or hospitalization:    1. Have you been having new or worsening chest pain? no   2. Have you been having new or worsening shortness of breath? no 3. Have you been having new or worsening leg swelling, wt gain, or increase in abdominal girth (pants fitting more tightly)? no   4. Have you had any passing out spells? no      COVID-19 Pre-Screening Questions:   Do you currently have a fever? no  Have you recently travelled on a cruise, internationally, or to [place], [place], [place], [place], or [place], [place]) ? no  Have you been in contact with someone that is currently pending confirmation of Covid19 testing or has been confirmed to have the [place] virus?  no   Are you currently experiencing fatigue or cough? no     CONSENT FOR TELE-HEALTH VISIT - PLEASE RVIEW  I hereby voluntarily request, consent and authorize [facility] and its employed or contracted physicians, physician assistants, nurse practitioners or other licensed health care professionals (the Practitioner), to provide me with telemedicine health care services (the Services") as deemed necessary by the treating Practitioner. I acknowledge and consent to receive the Services by the Practitioner via telemedicine. I understand that the telemedicine visit will involve communicating with the Practitioner through live audiovisual communication technology and the disclosure of certain medical information by electronic transmission. I acknowledge that I have been given the opportunity to request an in-person assessment or other available alternative prior to the telemedicine visit and am voluntarily participating in the telemedicine visit.  I understand that I have the right to withhold or withdraw my consent to the use of telemedicine in the course of my care at any time, without affecting my right to future care or treatment, and that the Practitioner or I may terminate the  telemedicine visit at any time. I understand that I have the right to inspect all information obtained and/or recorded in the course of the telemedicine visit and may receive copies of available information for a reasonable fee.  I understand that some of the potential risks of receiving the Services via telemedicine include:   Delay or interruption in medical evaluation due to technological equipment failure or disruption;  Information transmitted may not be sufficient (e.g. poor resolution of images) to allow for appropriate medical decision making by the Practitioner; and/or   In rare instances, security protocols could fail, causing a breach of personal health information.  Furthermore, I acknowledge that it is my responsibility to provide information about my medical history, conditions and care that is complete and accurate to the best of my ability. I acknowledge that Practitioner's advice, recommendations, and/or decision may be based on factors not within their control, such as incomplete or inaccurate data provided by me or distortions of diagnostic images or specimens that may result from electronic transmissions. I understand that the practice of medicine is not an exact science and that Practitioner makes no warranties or guarantees regarding treatment outcomes. I acknowledge that I will receive a copy of this consent concurrently upon execution via email to the email address I last provided but may also request a printed copy by calling the office of [place].    I understand that my insurance will be billed for this visit.   I have read or had this consent read to me.  I understand the contents of this consent, which adequately explains the benefits and risks  of the Services being provided via telemedicine.   I have been provided ample opportunity to ask questions regarding this consent and the Services and have had my questions answered to my satisfaction.  I give my informed  consent for the services to be provided through the use of telemedicine in my medical care  By participating in this telemedicine visit I agree to the above.Patient gives verbal consent for the televisit. 01/24/2019/pp

## 2019-01-26 ENCOUNTER — Other Ambulatory Visit: Payer: Self-pay | Admitting: Cardiology

## 2019-01-27 ENCOUNTER — Encounter: Payer: Self-pay | Admitting: Cardiology

## 2019-01-27 ENCOUNTER — Other Ambulatory Visit: Payer: Self-pay

## 2019-01-27 ENCOUNTER — Telehealth (INDEPENDENT_AMBULATORY_CARE_PROVIDER_SITE_OTHER): Payer: Medicare Other | Admitting: Cardiology

## 2019-01-27 VITALS — BP 105/54 | HR 73 | Wt 132.0 lb

## 2019-01-27 DIAGNOSIS — I110 Hypertensive heart disease with heart failure: Secondary | ICD-10-CM | POA: Diagnosis not present

## 2019-01-27 DIAGNOSIS — E785 Hyperlipidemia, unspecified: Secondary | ICD-10-CM

## 2019-01-27 DIAGNOSIS — Z7901 Long term (current) use of anticoagulants: Secondary | ICD-10-CM | POA: Diagnosis not present

## 2019-01-27 DIAGNOSIS — I482 Chronic atrial fibrillation, unspecified: Secondary | ICD-10-CM

## 2019-01-27 DIAGNOSIS — I2510 Atherosclerotic heart disease of native coronary artery without angina pectoris: Secondary | ICD-10-CM | POA: Diagnosis not present

## 2019-01-27 DIAGNOSIS — I509 Heart failure, unspecified: Secondary | ICD-10-CM

## 2019-01-27 DIAGNOSIS — Z79899 Other long term (current) drug therapy: Secondary | ICD-10-CM | POA: Diagnosis not present

## 2019-01-27 MED ORDER — RANITIDINE HCL 150 MG PO TABS: 150.0000 mg | ORAL_TABLET | Freq: Two times a day (BID) | ORAL | 2 refills | Status: DC

## 2019-01-27 MED ORDER — NITROGLYCERIN 0.4 MG SL SUBL: 0.4000 mg | SUBLINGUAL_TABLET | SUBLINGUAL | 11 refills | Status: DC | PRN

## 2019-01-27 NOTE — Progress Notes (Signed)
Virtual Visit via Telephone Note    Evaluation Performed:  Follow-up visit  This visit type was conducted due to national recommendations for restrictions regarding the COVID-19 Pandemic (e.g. social distancing).  This format is felt to be most appropriate for this patient at this time.  All issues noted in this document were discussed and addressed.  No physical exam was performed (except for noted visual exam findings with Video Visits).  Please refer to the patient's chart (MyChart message for video visits and phone note for telephone visits) for the patient's consent to telehealth for [facility].  Date:  01/27/2019   ID:  Angel Gentry, DOB Sep 20, 1935, MRN [id number]  Patient Location:  Home  Provider location:   [facility]  PCP:  Ronita Hipps, MD  Cardiologist:  No primary care provider on file. Dr Patrica Duel Electrophysiologist:  None   Chief Complaint:  FU for heart failure  History of Present Illness:    Angel Gentry is a 84 y.o. female who presents via audio/video conferencing for a telehealth visit today.    The patient does not symptoms concerning for COVID-19 infection (fever, chills, cough, or new shortness of breath).  Angel Gentry has a history of CAD with CABG, CHF,  Persistent Atrial Fibrillation not anticoagulated with GI, GU and mucosal bleeding with apixaban, hypertension and hyperlipidemia  last seen on 07/12/2018.  Overall she is done well she has good healthcare literacy her husband assists her weights have not varied more than 1 pound and blood pressure usually runs 1 44-8 20 systolic over 18-56.  She did have 1 day where her blood pressure was 96/68.  Heart rate runs from 70 to 90 bpm.  She fully sodium restricts and uses a pillbox for medications.  She said when she has tight socks she has a line at the ankle otherwise no edema and she has had no orthopnea chest pain shortness of breath palpitation or syncope.  She had a recent upper respiratory infection with  nasal congestion but did not become systemically sick and had no fever chills sputum production.  She is recovered.  No nausea or vomiting or weight loss and no recurrent diarrhea off fenofibrate.  I reviewed her recent labs performed in her PCP office Her LV EF% was 55-60% 12/10/18.  Prior CV studies:   The following studies were reviewed today:  Labs 12/17/2018 cholesterol 178 HDL 38 LDL 92 A1c 6.6 hemoglobin 13 g creatinine 1.0 potassium 4.1 TSH normal 0.48  Past Medical History:  Diagnosis Date   CAD in native artery    Dyslipidemia    Essential hypertension    Type 2 diabetes mellitus ([place])    Past Surgical History:  Procedure Laterality Date   CAROTID ENDARTERECTOMY     CATARACT EXTRACTION     CORONARY ARTERY BYPASS GRAFT       Current Meds  Medication Sig   apixaban (ELIQUIS) 2.5 MG TABS tablet Take 1 tablet (2.5 mg total) by mouth 2 (two) times daily.   bumetanide (BUMEX) 1 MG tablet Take 2 tablets (2 mg) in the morning and 1.5 tablets (1.5 mg) in the evening daily.   levothyroxine (SYNTHROID, LEVOTHROID) 88 MCG tablet Take 88 mcg by mouth daily.   losartan (COZAAR) 50 MG tablet Take 50 mg by mouth daily.   metoprolol succinate (TOPROL-XL) 50 MG 24 hr tablet Take 50 mg by mouth daily.   Multiple Vitamin (MULTIVITAMIN) capsule Take 1 capsule by mouth daily.    nitroGLYCERIN (  NITROSTAT) 0.4 MG SL tablet Place 0.4 mg under the tongue every 5 (five) minutes as needed for chest pain.    potassium chloride SA (K-DUR,KLOR-CON) 20 MEQ tablet Take 20 mEq by mouth daily.   ranitidine (ZANTAC) 150 MG tablet TAKE ONE TABLET BY MOUTH TWICE DAILY   simvastatin (ZOCOR) 10 MG tablet Take 10 mg by mouth daily.   Vitamin D, Ergocalciferol, (DRISDOL) 50000 units CAPS capsule TAKE ONE CAPSULE BY MOUTH EVERY TWO WEEKS     Allergies:   Nabumetone and Sulfa antibiotics   Social History   Tobacco Use   Smoking status: Never Smoker   Smokeless tobacco: Never Used    Substance Use Topics   Alcohol use: Not Currently   Drug use: Never     Family Hx: The patient's family history includes Cancer in her sister; Heart disease in her brother and mother.  ROS:   Please see the history of present illness.     All other systems reviewed and are negative.   Labs/Other Tests and Data Reviewed:    Recent Labs: 02/11/2018: BUN 27; Creatinine, Ser 1.12; NT-Pro BNP 1,299; Potassium 3.9; Sodium 140   Recent Lipid Panel Lab Results  Component Value Date/Time   CHOL 178 01/31/2018 08:14 AM   TRIG 165 (H) 01/31/2018 08:14 AM   HDL 49 01/31/2018 08:14 AM   CHOLHDL 3.6 01/31/2018 08:14 AM   LDLCALC 96 01/31/2018 08:14 AM    Wt Readings from Last 3 Encounters:  01/27/19 132 lb (59.9 kg)  07/12/18 133 lb (60.3 kg)  02/11/18 130 lb 6.4 oz (59.1 kg)     Exam:    Vital Signs:  BP (!) 105/54 (BP Location: Left Arm, Patient Position: Sitting)    Pulse 73    Wt 132 lb (59.9 kg)    BMI 25.78 kg/m      ASSESSMENT & PLAN:    1.  Coronary artery disease stable New York Heart Association class I continue medical therapy including anticoagulant beta-blocker and lipid-lowering therapy with statin. 2.  Chronic atrial fibrillation stable the rate is controlled with beta-blocker and she takes a reduced dose anticoagulant.  She has had no bleeding complication will continue her current Eliquis 3.  Chronic atrial fibrillation stable continue her anticoagulant 4.  Hypertensive heart disease with heart failure.  Blood pressure target continue treatment with heart failure including diuretic beta-blocker ARB.  Her last ejection fraction February 2019 55 to 60% the time of her next visit we will plan to do echocardiogram and if EF is less than 40 transition to Entresto 5.  Stable continue current lipid-lowering therapy with her intermediate intensity statin  COVID-19 Education: The signs and symptoms of COVID-19 were discussed with the patient and how to seek care for  testing (follow up with PCP or arrange E-visit).  The importance of social distancing was discussed today.  Patient Risk:   After full review of this patients clinical status, I feel that they are at least moderate risk at this time.  Time:   Today, I have spent 22 minutes with the patient with telehealth technology discussing her heart disease including heart failure stable compensated continue her current diuretic sodium restriction at home self-management hypertension blood pressure target continue treatment beta-blocker ARB and manage at home measuring blood pressure atrial fibrillation rate control with beta-blocker continue monitor heart rate at home and continue her anticoagulant and lipid-lowering therapy with an intermediate intensity statin.  I do not think she needs to come the office  I reviewed her recent labs which show her lipids at target and normal renal function and CBC.  I strongly encourage social distancing and handwashing.  Any problems she will phone my office and she will also continue her current dose of potassium.  Plan echocardiogram after next visit and if needed we can transition to [facility] if her ejection fraction is diminished.  I would like to see her face-to-face 3 months.     Medication Adjustments/Labs and Tests Ordered: Current medicines are reviewed at length with the patient today.  Concerns regarding medicines are outlined above.  Tests Ordered: No orders of the defined types were placed in this encounter.  Medication Changes: No orders of the defined types were placed in this encounter.   Disposition:  Follow up 3 months office  Signed, Shirlee More, MD  01/27/2019 2:56 PM    [place]

## 2019-01-27 NOTE — Patient Instructions (Signed)

## 2019-02-21 ENCOUNTER — Other Ambulatory Visit: Payer: Self-pay | Admitting: Cardiology

## 2019-02-21 NOTE — Telephone Encounter (Signed)
Rx refill sent to pharmacy. 

## 2019-04-30 ENCOUNTER — Other Ambulatory Visit: Payer: Self-pay | Admitting: Cardiology

## 2019-04-30 NOTE — Telephone Encounter (Signed)
Does pt still need to be on the Zantac? Please advise

## 2019-05-01 MED ORDER — FAMOTIDINE 20 MG PO TABS: 20.0000 mg | ORAL_TABLET | Freq: Every day | ORAL | 1 refills | Status: DC

## 2019-05-01 NOTE — Telephone Encounter (Signed)
Patient called and informed to stop taking zantac and replace it with pepcid 20 mg daily. Patient is agreeable and verbalized understanding. Prescription has been sent to [facility] in [place] as requested. No further questions.

## 2019-05-01 NOTE — Telephone Encounter (Signed)
Would you like to replace zantac with another medication due to recall? Thanks!

## 2019-05-01 NOTE — Telephone Encounter (Signed)
Famotidine 20 mg daily

## 2019-05-29 NOTE — Progress Notes (Signed)
Cardiology Office Note:    Date:  05/30/2019   ID:  Angel Gentry, DOB 03/01/1935, MRN [id number]  PCP:  Ronita Hipps, MD  Cardiologist:  Shirlee More, MD    Referring MD: Ronita Hipps, MD    ASSESSMENT:    1. Coronary artery disease involving native coronary artery of native heart without angina pectoris   2. Chronic diastolic congestive heart failure ([place])   3. Chronic atrial fibrillation   4. Chronic anticoagulation   5. Hypertensive heart disease with heart failure ([place])   6. Dyslipidemia    PLAN:    In order of problems listed above:  1. Stable CAD after CABG she has no anginal discomfort she will continue medical treatment at this time I would not advise an ischemia evaluation 2. She is meticulous with her self-management weight does not change more than 0.5 pounds New York Heart Association class morning continue her current treatment including her loop diuretic beta-blocker and ARB. 3. Stable rate controlled continue beta-blocker and reduced dose anticoagulant with age and body mass 4. Continue anticoagulant 5. BP at target continue guideline directed therapy ARB beta-blocker diuretic 6. Continue her statin check liver function lipid profile   Next appointment: 6 months   Medication Adjustments/Labs and Tests Ordered: Current medicines are reviewed at length with the patient today.  Concerns regarding medicines are outlined above.  No orders of the defined types were placed in this encounter.  No orders of the defined types were placed in this encounter.   No chief complaint on file.   History of Present Illness:    Angel Gentry is a 84 y.o. female with a hx of CAD with CABG, CHF,  Persistent Atrial Fibrillation not anticoagulated with GI, GU and mucosal bleeding with apixaban, hypertension and hyperlipidemia   last seen 01/27/2019 a virtual visit.Marland KitchenHer LV EF% was 55-60% 12/10/18.   Compliance with diet, lifestyle and medications: Yes  She takes  meticulous care of weighs daily checks heart rate and blood pressure, weight never varies by 1.5 pounds blood pressure runs generally 110-130/60-70 and heart rate is consistently 70 to 83 bpm.  She has had no bleeding edema shortness of breath orthopnea chest pain palpitation or syncope.  She has good healthcare literacy practices hand wash mask social distancing and really never leaves the home except for shopping groceries I asked her to start wearing eye protectors Past Medical History:  Diagnosis Date  . CAD in native artery   . Dyslipidemia   . Essential hypertension   . Type 2 diabetes mellitus ([place])     Past Surgical History:  Procedure Laterality Date  . CAROTID ENDARTERECTOMY    . CATARACT EXTRACTION    . CORONARY ARTERY BYPASS GRAFT      Current Medications: Current Meds  Medication Sig  . apixaban (ELIQUIS) 2.5 MG TABS tablet Take 1 tablet (2.5 mg total) by mouth 2 (two) times daily.  . bumetanide (BUMEX) 1 MG tablet Take 2 tablets (2 mg) in the morning and 1.5 tablets (1.5 mg) in the evening daily.  . famotidine (PEPCID) 20 MG tablet Take 1 tablet (20 mg total) by mouth daily.  Marland Kitchen levothyroxine (SYNTHROID, LEVOTHROID) 88 MCG tablet Take 88 mcg by mouth daily.  Marland Kitchen losartan (COZAAR) 50 MG tablet Take 50 mg by mouth daily.  . metoprolol succinate (TOPROL-XL) 50 MG 24 hr tablet Take 50 mg by mouth daily.  . Multiple Vitamin (MULTIVITAMIN) capsule Take 1 capsule by mouth daily.   Marland Kitchen  nitroGLYCERIN (NITROSTAT) 0.4 MG SL tablet Place 1 tablet (0.4 mg total) under the tongue every 5 (five) minutes as needed for chest pain.  . potassium chloride SA (K-DUR,KLOR-CON) 20 MEQ tablet Take 20 mEq by mouth daily.  . simvastatin (ZOCOR) 10 MG tablet Take 10 mg by mouth daily.  . Vitamin D, Ergocalciferol, (DRISDOL) 50000 units CAPS capsule TAKE ONE CAPSULE BY MOUTH EVERY TWO WEEKS     Allergies:   Nabumetone and Sulfa antibiotics   Social History   Socioeconomic History  . Marital status:  Married    Spouse name: Not on file  . Number of children: Not on file  . Years of education: Not on file  . Highest education level: Not on file  Occupational History  . Not on file  Social Needs  . Financial resource strain: Not on file  . Food insecurity    Worry: Not on file    Inability: Not on file  . Transportation needs    Medical: Not on file    Non-medical: Not on file  Tobacco Use  . Smoking status: Never Smoker  . Smokeless tobacco: Never Used  Substance and Sexual Activity  . Alcohol use: Not Currently  . Drug use: Never  . Sexual activity: Not on file  Lifestyle  . Physical activity    Days per week: Not on file    Minutes per session: Not on file  . Stress: Not on file  Relationships  . Social Herbalist on phone: Not on file    Gets together: Not on file    Attends religious service: Not on file    Active member of club or organization: Not on file    Attends meetings of clubs or organizations: Not on file    Relationship status: Not on file  Other Topics Concern  . Not on file  Social History Narrative  . Not on file     Family History: The patient's family history includes Cancer in her sister; Heart disease in her brother and mother. ROS:   Please see the history of present illness.    All other systems reviewed and are negative.  EKGs/Labs/Other Studies Reviewed:    The following studies were reviewed today:    Recent Labs: Labs 12/17/2018 cholesterol 178 HDL 38 LDL 92 A1c 6.6 hemoglobin 13 g creatinine 1.0 potassium 4.1 TSH normal 0.48  Recent Lipid Panel    Component Value Date/Time   CHOL 178 01/31/2018 0814   TRIG 165 (H) 01/31/2018 0814   HDL 49 01/31/2018 0814   CHOLHDL 3.6 01/31/2018 0814   LDLCALC 96 01/31/2018 0814    Physical Exam:    VS:  BP 128/76 (BP Location: Left Arm, Patient Position: Sitting, Cuff Size: Normal)   Pulse 78   Temp 98.7 F (37.1 C) (Temporal)   Ht 5' (1.524 m)   Wt 134 lb (60.8 kg)    SpO2 96%   BMI 26.17 kg/m     Wt Readings from Last 3 Encounters:  05/30/19 134 lb (60.8 kg)  01/27/19 132 lb (59.9 kg)  07/12/18 133 lb (60.3 kg)     GEN:  Well nourished, well developed in no acute distress HEENT: Normal NECK: No JVD; No carotid bruits LYMPHATICS: No lymphadenopathy CARDIAC: Irregular variable first heart sound no murmurs, rubs, gallops RESPIRATORY:  Clear to auscultation without rales, wheezing or rhonchi  ABDOMEN: Soft, non-tender, non-distended MUSCULOSKELETAL:  No edema; No deformity  SKIN: Warm and dry NEUROLOGIC:  Alert and oriented x 3 PSYCHIATRIC:  Normal affect    Signed, Shirlee More, MD  05/30/2019 1:59 PM    [place]

## 2019-05-30 ENCOUNTER — Ambulatory Visit (INDEPENDENT_AMBULATORY_CARE_PROVIDER_SITE_OTHER): Payer: Medicare Other | Admitting: Cardiology

## 2019-05-30 ENCOUNTER — Other Ambulatory Visit: Payer: Self-pay

## 2019-05-30 ENCOUNTER — Encounter: Payer: Self-pay | Admitting: Cardiology

## 2019-05-30 VITALS — BP 128/76 | HR 78 | Temp 98.7°F | Ht 60.0 in | Wt 134.0 lb

## 2019-05-30 DIAGNOSIS — I5032 Chronic diastolic (congestive) heart failure: Secondary | ICD-10-CM

## 2019-05-30 DIAGNOSIS — I482 Chronic atrial fibrillation, unspecified: Secondary | ICD-10-CM | POA: Diagnosis not present

## 2019-05-30 DIAGNOSIS — E785 Hyperlipidemia, unspecified: Secondary | ICD-10-CM

## 2019-05-30 DIAGNOSIS — Z7901 Long term (current) use of anticoagulants: Secondary | ICD-10-CM | POA: Diagnosis not present

## 2019-05-30 DIAGNOSIS — I2510 Atherosclerotic heart disease of native coronary artery without angina pectoris: Secondary | ICD-10-CM | POA: Diagnosis not present

## 2019-05-30 DIAGNOSIS — I110 Hypertensive heart disease with heart failure: Secondary | ICD-10-CM | POA: Diagnosis not present

## 2019-05-30 NOTE — Patient Instructions (Signed)
Medication Instructions:  Your physician recommends that you continue on your current medications as directed. Please refer to the Current Medication list given to you today.  If you need a refill on your cardiac medications before your next appointment, please call your pharmacy.   Lab work: Your physician recommends that you return for lab work today: CMP, ProBNP, lipid panel.   If you have labs (blood work) drawn today and your tests are completely normal, you will receive your results only by: Marland Kitchen MyChart Message (if you have MyChart) OR . A paper copy in the mail If you have any lab test that is abnormal or we need to change your treatment, we will call you to review the results.  Testing/Procedures: None  Follow-Up: At [facility], you and your health needs are our priority.  As part of our continuing mission to provide you with exceptional heart care, we have created designated Provider Care Teams.  These Care Teams include your primary Cardiologist (physician) and Advanced Practice Providers (APPs -  Physician Assistants and Nurse Practitioners) who all work together to provide you with the care you need, when you need it. You will need a follow up appointment in 6 months.  Please call our office 2 months in advance to schedule this appointment.

## 2019-05-31 LAB — COMPREHENSIVE METABOLIC PANEL
ALT: 23 IU/L (ref 0–32)
AST: 26 IU/L (ref 0–40)
Albumin/Globulin Ratio: 1.6 (ref 1.2–2.2)
Albumin: 4.4 g/dL (ref 3.6–4.6)
Alkaline Phosphatase: 86 IU/L (ref 39–117)
BUN/Creatinine Ratio: 27 (ref 12–28)
BUN: 23 mg/dL (ref 8–27)
Bilirubin Total: 0.5 mg/dL (ref 0.0–1.2)
CO2: 25 mmol/L (ref 20–29)
Calcium: 9.6 mg/dL (ref 8.7–10.3)
Chloride: 98 mmol/L (ref 96–106)
Creatinine, Ser: 0.85 mg/dL (ref 0.57–1.00)
GFR calc Af Amer: 74 mL/min/{1.73_m2} (ref >59)
GFR calc non Af Amer: 64 mL/min/{1.73_m2} (ref >59)
Globulin, Total: 2.7 g/dL (ref 1.5–4.5)
Glucose: 151 mg/dL — ABNORMAL HIGH (ref 65–99)
Potassium: 3.7 mmol/L (ref 3.5–5.2)
Sodium: 143 mmol/L (ref 134–144)
Total Protein: 7.1 g/dL (ref 6.0–8.5)

## 2019-05-31 LAB — LIPID PANEL
Chol/HDL Ratio: 3.8 ratio (ref 0.0–4.4)
Cholesterol, Total: 180 mg/dL (ref 100–199)
HDL: 48 mg/dL (ref >39)
LDL Calculated: 69 mg/dL (ref 0–99)
Triglycerides: 314 mg/dL — ABNORMAL HIGH (ref 0–149)
VLDL Cholesterol Cal: 63 mg/dL — ABNORMAL HIGH (ref 5–40)

## 2019-05-31 LAB — PRO B NATRIURETIC PEPTIDE: NT-Pro BNP: 1363 pg/mL — ABNORMAL HIGH (ref 0–738)

## 2019-06-17 ENCOUNTER — Other Ambulatory Visit: Payer: Self-pay | Admitting: Cardiology

## 2019-06-17 DIAGNOSIS — I5032 Chronic diastolic (congestive) heart failure: Secondary | ICD-10-CM

## 2019-06-17 DIAGNOSIS — I480 Paroxysmal atrial fibrillation: Secondary | ICD-10-CM

## 2019-06-22 ENCOUNTER — Other Ambulatory Visit: Payer: Self-pay | Admitting: Cardiology

## 2019-06-23 NOTE — Telephone Encounter (Signed)
prescriber not at this location needs to be sent to PCP Dr. Kennith Maes

## 2019-06-24 ENCOUNTER — Other Ambulatory Visit: Payer: Self-pay

## 2019-06-24 MED ORDER — FAMOTIDINE 20 MG PO TABS: 20.0000 mg | ORAL_TABLET | Freq: Every day | ORAL | 5 refills | Status: DC

## 2019-07-12 DIAGNOSIS — Z23 Encounter for immunization: Secondary | ICD-10-CM | POA: Diagnosis not present

## 2019-08-01 ENCOUNTER — Telehealth: Payer: Self-pay | Admitting: Cardiology

## 2019-08-01 DIAGNOSIS — I480 Paroxysmal atrial fibrillation: Secondary | ICD-10-CM

## 2019-08-01 DIAGNOSIS — I5032 Chronic diastolic (congestive) heart failure: Secondary | ICD-10-CM

## 2019-08-01 MED ORDER — BUMETANIDE 1 MG PO TABS: ORAL_TABLET | ORAL | 1 refills | Status: DC

## 2019-08-01 NOTE — Addendum Note (Signed)
Addended by: Austin Miles on: 08/01/2019 01:48 PM   Modules accepted: Orders

## 2019-08-01 NOTE — Telephone Encounter (Signed)
Patient has called concerned that her feet are swelling really bad and she wanted to see BJM, Sh ehas notgained weight she has actually lost.. please call her

## 2019-08-01 NOTE — Telephone Encounter (Signed)
Patient states she has been experiencing lower extremity edema for the past 2 weeks. Her vital signs are within normal limits and she has no further complaints. Dr. Bettina Gavia advised for her to increase bumex 1 mg from 2 tablets in the morning and 1.5 tablets in the afternoon to 2 tablets twice daily. Patient is agreeable and verbalized understanding. No further questions.

## 2019-10-01 DIAGNOSIS — Z853 Personal history of malignant neoplasm of breast: Secondary | ICD-10-CM | POA: Diagnosis not present

## 2019-10-10 DIAGNOSIS — H61303 Acquired stenosis of external ear canal, unspecified, bilateral: Secondary | ICD-10-CM | POA: Diagnosis not present

## 2019-10-10 DIAGNOSIS — H9190 Unspecified hearing loss, unspecified ear: Secondary | ICD-10-CM | POA: Diagnosis not present

## 2019-10-10 DIAGNOSIS — H938X2 Other specified disorders of left ear: Secondary | ICD-10-CM | POA: Diagnosis not present

## 2019-11-24 ENCOUNTER — Other Ambulatory Visit: Payer: Self-pay | Admitting: Cardiology

## 2019-11-24 DIAGNOSIS — I480 Paroxysmal atrial fibrillation: Secondary | ICD-10-CM

## 2019-11-24 DIAGNOSIS — I5032 Chronic diastolic (congestive) heart failure: Secondary | ICD-10-CM

## 2019-11-25 ENCOUNTER — Other Ambulatory Visit: Payer: Self-pay

## 2019-11-25 DIAGNOSIS — I5032 Chronic diastolic (congestive) heart failure: Secondary | ICD-10-CM

## 2019-11-25 DIAGNOSIS — I480 Paroxysmal atrial fibrillation: Secondary | ICD-10-CM

## 2019-11-25 MED ORDER — BUMETANIDE 1 MG PO TABS: ORAL_TABLET | ORAL | 1 refills | Status: DC

## 2019-11-25 NOTE — Progress Notes (Signed)
Cardiology Office Note:    Date:  11/26/2019   ID:  [place], [place] 1935-05-19, MRN [id number]  PCP:  Ronita Hipps, MD  Cardiologist:  Shirlee More, MD    Referring MD: Ronita Hipps, MD    ASSESSMENT:    1. Chronic diastolic congestive heart failure ([place])   2. Coronary artery disease involving native coronary artery of native heart without angina pectoris   3. Hypertensive heart disease with heart failure ([place])   4. Dyslipidemia   5. Chronic atrial fibrillation (HCC)    PLAN:    In order of problems listed above:  1. She is meticulous with her self every day and never varies more than 1/2 pound or up or down compliant with and tolerates her medications blood pressure and heart rate are in range and her quality of life is good.  She is not having edema shortness of breath chest pain palpitation or syncope.  She is overdue and will check labs today including renal function proBNP 2. Stable CAD having no angina after bypass surgery on current medical treatment we will continue the same including lipid-lowering we will check a lipid profile and liver function.  I do not think she requires an ischemia evaluation at this time New York Heart Association class I 3. Stable blood pressures at target on guideline directed therapy including ARB 4. Continue her statin check lipid profile liver function efficacy and safety 5. Fib is rate controlled continue beta-blocker and she tolerates the reduced dose anticoagulant without bleeding.  Check a CBC 6. For thyroid she is overdue for labs with COVID-19 has not seen her PCP and we will check a TSH and T4 today she is on thyroid supplementation   Next appointment: 6 months   Medication Adjustments/Labs and Tests Ordered: Current medicines are reviewed at length with the patient today.  Concerns regarding medicines are outlined above.  No orders of the defined types were placed in this encounter.  No orders of the defined  types were placed in this encounter.   Chief Complaint  Patient presents with  . Follow-up  . Congestive Heart Failure    History of Present Illness:    Angel Gentry is a 85 y.o. female with a hx of CAD with CABG, CHF,  Persistent Atrial Fibrillation not anticoagulated with GI, GU and mucosal bleeding with apixaban, hypertension and hyperlipidemia Her LV EF% was 55-60% 12/10/18.    She was last seen 05/30/2019. Compliance with diet, lifestyle and medications: Yes  Is a complex visit with multiple medical problems.  Fortunately she is meticulous patient weighs daily does not change more than half a pound heart rate around 70 to 90 bpm and blood pressure 1 [id number] 20 systolic consistently.  She has had no chest pain bleeding no muscle pain from her statin or weakness no GI or GU bleeding from her anticoagulant and has had no edema shortness of breath palpitation or syncope.  Her and her husband to receive first dose of Pfizer vaccine.  She is overdue for labs I will draw them today and be sure a copy is routed to her primary care physician   Ref Range & Units 5 mo ago 1 yr ago  NT-Pro BNP 0 - 738 pg/mL 1,363High   1,299High     Past Medical History:  Diagnosis Date  . CAD in native artery   . Dyslipidemia   . Essential hypertension   . Type 2 diabetes mellitus ([place])  Past Surgical History:  Procedure Laterality Date  . CAROTID ENDARTERECTOMY    . CATARACT EXTRACTION    . CORONARY ARTERY BYPASS GRAFT      Current Medications: Current Meds  Medication Sig  . apixaban (ELIQUIS) 2.5 MG TABS tablet Take 1 tablet (2.5 mg total) by mouth 2 (two) times daily.  . bumetanide (BUMEX) 1 MG tablet Take 2 tablets (2 mg) in the morning and 1.5 tablets (1.5 mg) in the evening daily.  . famotidine (PEPCID) 20 MG tablet Take 1 tablet (20 mg total) by mouth daily.  Marland Kitchen levothyroxine (SYNTHROID, LEVOTHROID) 88 MCG tablet Take 88 mcg by mouth daily.  Marland Kitchen losartan (COZAAR) 50 MG  tablet Take 50 mg by mouth daily.  . metoprolol succinate (TOPROL-XL) 50 MG 24 hr tablet Take 50 mg by mouth daily.  . Multiple Vitamin (MULTIVITAMIN) capsule Take 1 capsule by mouth daily.   . nitroGLYCERIN (NITROSTAT) 0.4 MG SL tablet Place 1 tablet (0.4 mg total) under the tongue every 5 (five) minutes as needed for chest pain.  . potassium chloride SA (K-DUR,KLOR-CON) 20 MEQ tablet Take 20 mEq by mouth daily.  . simvastatin (ZOCOR) 10 MG tablet Take 10 mg by mouth daily.  . Vitamin D, Ergocalciferol, (DRISDOL) 50000 units CAPS capsule TAKE ONE CAPSULE BY MOUTH EVERY TWO WEEKS     Allergies:   Nabumetone and Sulfa antibiotics   Social History   Socioeconomic History  . Marital status: Married    Spouse name: Not on file  . Number of children: Not on file  . Years of education: Not on file  . Highest education level: Not on file  Occupational History  . Not on file  Tobacco Use  . Smoking status: Never Smoker  . Smokeless tobacco: Never Used  Substance and Sexual Activity  . Alcohol use: Not Currently  . Drug use: Never  . Sexual activity: Not on file  Other Topics Concern  . Not on file  Social History Narrative  . Not on file   Social Determinants of Health   Financial Resource Strain:   . Difficulty of Paying Living Expenses: Not on file  Food Insecurity:   . Worried About Charity fundraiser in the Last Year: Not on file  . Ran Out of Food in the Last Year: Not on file  Transportation Needs:   . Lack of Transportation (Medical): Not on file  . Lack of Transportation (Non-Medical): Not on file  Physical Activity:   . Days of Exercise per Week: Not on file  . Minutes of Exercise per Session: Not on file  Stress:   . Feeling of Stress : Not on file  Social Connections:   . Frequency of Communication with Friends and Family: Not on file  . Frequency of Social Gatherings with Friends and Family: Not on file  . Attends Religious Services: Not on file  . Active  Member of Clubs or Organizations: Not on file  . Attends Archivist Meetings: Not on file  . Marital Status: Not on file     Family History: The patient's family history includes Cancer in her sister; Heart disease in her brother and mother. ROS:   Please see the history of present illness.    All other systems reviewed and are negative.  EKGs/Labs/Other Studies Reviewed:    The following studies were reviewed today:  EKG:  EKG ordered today and personally reviewed.  The ekg ordered today demonstrates atrial fibrillation controlled rate nonspecific ST  abnormality  Recent Labs: 05/30/2019: ALT 23; BUN 23; Creatinine, Ser 0.85; NT-Pro BNP 1,363; Potassium 3.7; Sodium 143  Recent Lipid Panel    Component Value Date/Time   CHOL 180 05/30/2019 1409   TRIG 314 (H) 05/30/2019 1409   HDL 48 05/30/2019 1409   CHOLHDL 3.8 05/30/2019 1409   LDLCALC 69 05/30/2019 1409    Physical Exam:    VS:  BP 126/70   Pulse 92   Ht 5' (1.524 m)   Wt 130 lb 6.4 oz (59.1 kg)   SpO2 100%   BMI 25.47 kg/m     Wt Readings from Last 3 Encounters:  11/26/19 130 lb 6.4 oz (59.1 kg)  05/30/19 134 lb (60.8 kg)  01/27/19 132 lb (59.9 kg)     GEN: She looks frail well nourished, well developed in no acute distress HEENT: Normal NECK: No JVD; No carotid bruits LYMPHATICS: No lymphadenopathy CARDIAC: Regular S1 variable RRR, norubs, gallops RESPIRATORY:  Clear to auscultation without rales, wheezing or rhonchi  ABDOMEN: Soft, non-tender, non-distended MUSCULOSKELETAL:  No edema; No deformity  SKIN: Warm and dry NEUROLOGIC:  Alert and oriented x 3 PSYCHIATRIC:  Normal affect    Signed, Shirlee More, MD  11/26/2019 8:49 AM    [facility]

## 2019-11-26 ENCOUNTER — Other Ambulatory Visit: Payer: Self-pay

## 2019-11-26 ENCOUNTER — Encounter: Payer: Self-pay | Admitting: Cardiology

## 2019-11-26 ENCOUNTER — Ambulatory Visit (INDEPENDENT_AMBULATORY_CARE_PROVIDER_SITE_OTHER): Payer: Medicare Other | Admitting: Cardiology

## 2019-11-26 VITALS — BP 126/70 | HR 92 | Ht 60.0 in | Wt 130.4 lb

## 2019-11-26 DIAGNOSIS — I110 Hypertensive heart disease with heart failure: Secondary | ICD-10-CM | POA: Diagnosis not present

## 2019-11-26 DIAGNOSIS — I482 Chronic atrial fibrillation, unspecified: Secondary | ICD-10-CM

## 2019-11-26 DIAGNOSIS — E785 Hyperlipidemia, unspecified: Secondary | ICD-10-CM

## 2019-11-26 DIAGNOSIS — I2510 Atherosclerotic heart disease of native coronary artery without angina pectoris: Secondary | ICD-10-CM

## 2019-11-26 DIAGNOSIS — I5032 Chronic diastolic (congestive) heart failure: Secondary | ICD-10-CM | POA: Diagnosis not present

## 2019-11-26 NOTE — Patient Instructions (Addendum)
Medication Instructions:  Your physician recommends that you continue on your current medications as directed. Please refer to the Current Medication list given to you today.  *If you need a refill on your cardiac medications before your next appointment, please call your pharmacy*  Lab Work: CMP, CBC, LIPIDS, PRO BNP, TSH & T4 - Today   If you have labs (blood work) drawn today and your tests are completely normal, you will receive your results only by: Marland Kitchen MyChart Message (if you have MyChart) OR . A paper copy in the mail If you have any lab test that is abnormal or we need to change your treatment, we will call you to review the results.  Testing/Procedures: None ordered   Follow-Up: At [facility], you and your health needs are our priority.  As part of our continuing mission to provide you with exceptional heart care, we have created designated Provider Care Teams.  These Care Teams include your primary Cardiologist (physician) and Advanced Practice Providers (APPs -  Physician Assistants and Nurse Practitioners) who all work together to provide you with the care you need, when you need it.  Your next appointment:   6 month(s)  The format for your next appointment:   In Person  Provider:   Shirlee More, MD  Other Instructions None

## 2019-11-27 LAB — CBC
Hematocrit: 38.2 % (ref 34.0–46.6)
Hemoglobin: 12.9 g/dL (ref 11.1–15.9)
MCH: 30.3 pg (ref 26.6–33.0)
MCHC: 33.8 g/dL (ref 31.5–35.7)
MCV: 90 fL (ref 79–97)
Platelets: 160 10*3/uL (ref 150–450)
RBC: 4.26 x10E6/uL (ref 3.77–5.28)
RDW: 13 % (ref 11.7–15.4)
WBC: 6.4 10*3/uL (ref 3.4–10.8)

## 2019-11-27 LAB — COMPREHENSIVE METABOLIC PANEL
ALT: 19 IU/L (ref 0–32)
AST: 25 IU/L (ref 0–40)
Albumin/Globulin Ratio: 1.6 (ref 1.2–2.2)
Albumin: 4.2 g/dL (ref 3.6–4.6)
Alkaline Phosphatase: 85 IU/L (ref 39–117)
BUN/Creatinine Ratio: 26 (ref 12–28)
BUN: 20 mg/dL (ref 8–27)
Bilirubin Total: 0.5 mg/dL (ref 0.0–1.2)
CO2: 28 mmol/L (ref 20–29)
Calcium: 9.4 mg/dL (ref 8.7–10.3)
Chloride: 98 mmol/L (ref 96–106)
Creatinine, Ser: 0.78 mg/dL (ref 0.57–1.00)
GFR calc Af Amer: 82 mL/min/{1.73_m2} (ref >59)
GFR calc non Af Amer: 71 mL/min/{1.73_m2} (ref >59)
Globulin, Total: 2.7 g/dL (ref 1.5–4.5)
Glucose: 121 mg/dL — ABNORMAL HIGH (ref 65–99)
Potassium: 3.8 mmol/L (ref 3.5–5.2)
Sodium: 140 mmol/L (ref 134–144)
Total Protein: 6.9 g/dL (ref 6.0–8.5)

## 2019-11-27 LAB — TSH: TSH: 0.322 u[IU]/mL — ABNORMAL LOW (ref 0.450–4.500)

## 2019-11-27 LAB — PRO B NATRIURETIC PEPTIDE: NT-Pro BNP: 995 pg/mL — ABNORMAL HIGH (ref 0–738)

## 2019-11-27 LAB — LIPID PANEL
Chol/HDL Ratio: 3.4 ratio (ref 0.0–4.4)
Cholesterol, Total: 169 mg/dL (ref 100–199)
HDL: 50 mg/dL (ref >39)
LDL Chol Calc (NIH): 80 mg/dL (ref 0–99)
Triglycerides: 236 mg/dL — ABNORMAL HIGH (ref 0–149)
VLDL Cholesterol Cal: 39 mg/dL (ref 5–40)

## 2019-11-27 LAB — T4, FREE: Free T4: 1.93 ng/dL — ABNORMAL HIGH (ref 0.82–1.77)

## 2019-11-28 DIAGNOSIS — Z1231 Encounter for screening mammogram for malignant neoplasm of breast: Secondary | ICD-10-CM | POA: Diagnosis not present

## 2019-12-03 ENCOUNTER — Telehealth: Payer: Self-pay

## 2019-12-03 NOTE — Telephone Encounter (Signed)
-----   Message from Richardo Priest, MD sent at 12/02/2019  5:53 PM EST ----- Normal or stable result  No changes

## 2019-12-03 NOTE — Telephone Encounter (Signed)
Pt requested that recent lab results be mailed to her. Labs printed and set to be mailed.

## 2019-12-03 NOTE — Telephone Encounter (Signed)
The patient has been notified of the result and verbalized understanding.  All questions (if any) were answered. Wilma Flavin, RN 12/03/2019 9:10 AM

## 2019-12-17 DIAGNOSIS — Z79899 Other long term (current) drug therapy: Secondary | ICD-10-CM | POA: Diagnosis not present

## 2019-12-17 DIAGNOSIS — E039 Hypothyroidism, unspecified: Secondary | ICD-10-CM | POA: Diagnosis not present

## 2019-12-17 DIAGNOSIS — E119 Type 2 diabetes mellitus without complications: Secondary | ICD-10-CM | POA: Diagnosis not present

## 2019-12-22 DIAGNOSIS — Z1331 Encounter for screening for depression: Secondary | ICD-10-CM | POA: Diagnosis not present

## 2019-12-22 DIAGNOSIS — Z6825 Body mass index (BMI) 25.0-25.9, adult: Secondary | ICD-10-CM | POA: Diagnosis not present

## 2019-12-22 DIAGNOSIS — E119 Type 2 diabetes mellitus without complications: Secondary | ICD-10-CM | POA: Diagnosis not present

## 2019-12-22 DIAGNOSIS — Z0000 Encounter for general adult medical examination without abnormal findings: Secondary | ICD-10-CM | POA: Diagnosis not present

## 2020-02-02 ENCOUNTER — Other Ambulatory Visit: Payer: Self-pay | Admitting: Cardiology

## 2020-05-28 ENCOUNTER — Ambulatory Visit (INDEPENDENT_AMBULATORY_CARE_PROVIDER_SITE_OTHER): Payer: Medicare Other | Admitting: Cardiology

## 2020-05-28 ENCOUNTER — Other Ambulatory Visit: Payer: Self-pay

## 2020-05-28 ENCOUNTER — Encounter (INDEPENDENT_AMBULATORY_CARE_PROVIDER_SITE_OTHER): Payer: Self-pay

## 2020-05-28 VITALS — BP 144/76 | HR 84 | Ht 61.0 in | Wt 136.0 lb

## 2020-05-28 DIAGNOSIS — E785 Hyperlipidemia, unspecified: Secondary | ICD-10-CM

## 2020-05-28 DIAGNOSIS — I5032 Chronic diastolic (congestive) heart failure: Secondary | ICD-10-CM

## 2020-05-28 DIAGNOSIS — I2510 Atherosclerotic heart disease of native coronary artery without angina pectoris: Secondary | ICD-10-CM

## 2020-05-28 DIAGNOSIS — I482 Chronic atrial fibrillation, unspecified: Secondary | ICD-10-CM | POA: Diagnosis not present

## 2020-05-28 DIAGNOSIS — I110 Hypertensive heart disease with heart failure: Secondary | ICD-10-CM | POA: Diagnosis not present

## 2020-05-28 DIAGNOSIS — Z7901 Long term (current) use of anticoagulants: Secondary | ICD-10-CM | POA: Diagnosis not present

## 2020-05-28 NOTE — Progress Notes (Signed)
Cardiology Office Note:    Date:  05/28/2020   ID:  Angel Gentry, [place] [birth year], MRN [id number]  PCP:  Ronita Hipps, MD  Cardiologist:  Shirlee More, MD    Referring MD: Ronita Hipps, MD    ASSESSMENT:    1. Chronic diastolic congestive heart failure ([place])   2. Hypertensive heart disease with heart failure ([place])   3. Chronic atrial fibrillation ([place])   4. Chronic anticoagulation   5. Coronary artery disease involving native coronary artery of native heart without angina pectoris   6. Dyslipidemia    PLAN:    In order of problems listed above:  1. As always she is doing well her heart failure is compensated she has no edema New York Heart Association class I we will continue her current loop diuretic.  Her atrial fibrillation is rate controlled on a beta-blocker and she tolerates reduced dose anticoagulant with age body mass without bleeding complication.  Her CAD is stable after bypass having no angina on current treatment and will continue intermediate intensity statin with her LDL at target. 2. We will take this opportunity check labs on her before Covid gets worse including a lipid profile CMP and proBNP level 3. She is having joint pain I told her that with her anticoagulant she is best to use Tylenol not more than 4 tablets a day or topical ibuprofen   Next appointment:  4 months   Medication Adjustments/Labs and Tests Ordered: Current medicines are reviewed at length with the patient today.  Concerns regarding medicines are outlined above.  Orders Placed This Encounter  Procedures  . Comprehensive metabolic panel  . Lipid panel  . Pro b natriuretic peptide (BNP)   No orders of the defined types were placed in this encounter.   Chief Complaint  Patient presents with  . Follow-up  . Congestive Heart Failure    History of Present Illness:    Angel Gentry is a 85 y.o. female with a hx of CAD with CABG, CHF,  Persistent Atrial  Fibrillation anticoagulated with apixaban, hypertension and hyperlipidemia. Her LV EF% was 55-60% 12/10/18.  The EKG 11/26/2019 showed rate controlled atrial fibrillation She was last seen 11/26/2019. Compliance with diet, lifestyle and medications: She takes meticulous care of herself and is absolutely compliant.  Her weight does not vary more than 1/2 pound 135 and half to 136-1/2.  Heart rate 70 to 90 bpm blood pressure 110-130/60-70.  Both her and her husband have had their COVID-19 vaccine.  She has had no chest pain shortness of breath edema palpitation or syncope.  Last labs were performed in April cholesterol 161 LDL 78 triglyceride 186 HDL 46 A1c 6.4%  Her proBNP levels have been stable:  Ref Range & Units 6 mo ago 12 mo ago 2 yr ago  NT-Pro BNP 0 - 738 pg/mL 995High  1,363High CM  1,299High    Past Medical History:  Diagnosis Date  . CAD in native artery   . Dyslipidemia   . Essential hypertension   . Type 2 diabetes mellitus ([place])     Past Surgical History:  Procedure Laterality Date  . CAROTID ENDARTERECTOMY    . CATARACT EXTRACTION    . CORONARY ARTERY BYPASS GRAFT      Current Medications: Current Meds  Medication Sig  . apixaban (ELIQUIS) 2.5 MG TABS tablet Take 1 tablet (2.5 mg total) by mouth 2 (two) times daily.  . bumetanide (BUMEX) 1 MG tablet Take 2  tablets (2 mg) in the morning and 1.5 tablets (1.5 mg) in the evening daily. (Patient taking differently: 1 mg. Take 2 tablets (2 mg) in the morning and 1.5 tablets (1.5 mg) in the evening daily.)  . famotidine (PEPCID) 20 MG tablet Take 1 tablet (20 mg total) by mouth daily.  Marland Kitchen levothyroxine (SYNTHROID, LEVOTHROID) 88 MCG tablet Take 88 mcg by mouth daily.  Marland Kitchen losartan (COZAAR) 50 MG tablet Take 50 mg by mouth daily.  . metoprolol succinate (TOPROL-XL) 50 MG 24 hr tablet Take 50 mg by mouth daily.  . Multiple Vitamin (MULTIVITAMIN) capsule Take 1 capsule by mouth daily.   . nitroGLYCERIN (NITROSTAT) 0.4 MG SL  tablet Place 1 tablet (0.4 mg total) under the tongue every 5 (five) minutes as needed for chest pain.  . potassium chloride SA (K-DUR,KLOR-CON) 20 MEQ tablet Take 20 mEq by mouth daily.  . simvastatin (ZOCOR) 10 MG tablet Take 10 mg by mouth daily.  . Vitamin D, Ergocalciferol, (DRISDOL) 50000 units CAPS capsule TAKE ONE CAPSULE BY MOUTH EVERY TWO WEEKS     Allergies:   Nabumetone and Sulfa antibiotics   Social History   Socioeconomic History  . Marital status: Married    Spouse name: Not on file  . Number of children: Not on file  . Years of education: Not on file  . Highest education level: Not on file  Occupational History  . Not on file  Tobacco Use  . Smoking status: Never Smoker  . Smokeless tobacco: Never Used  Vaping Use  . Vaping Use: Never used  Substance and Sexual Activity  . Alcohol use: Not Currently  . Drug use: Never  . Sexual activity: Not on file  Other Topics Concern  . Not on file  Social History Narrative  . Not on file   Social Determinants of Health   Financial Resource Strain:   . Difficulty of Paying Living Expenses:   Food Insecurity:   . Worried About Charity fundraiser in the Last Year:   . Arboriculturist in the Last Year:   Transportation Needs:   . Film/video editor (Medical):   Marland Kitchen Lack of Transportation (Non-Medical):   Physical Activity:   . Days of Exercise per Week:   . Minutes of Exercise per Session:   Stress:   . Feeling of Stress :   Social Connections:   . Frequency of Communication with Friends and Family:   . Frequency of Social Gatherings with Friends and Family:   . Attends Religious Services:   . Active Member of Clubs or Organizations:   . Attends Archivist Meetings:   Marland Kitchen Marital Status:      Family History: The patient's family history includes Cancer in her sister; Heart disease in her brother and mother. ROS:   Please see the history of present illness.    All other systems reviewed and are  negative.  EKGs/Labs/Other Studies Reviewed:    The following studies were reviewed today:    Recent Labs: 11/26/2019: ALT 19; BUN 20; Creatinine, Ser 0.78; Hemoglobin 12.9; NT-Pro BNP 995; Platelets 160; Potassium 3.8; Sodium 140; TSH 0.322  Recent Lipid Panel    Component Value Date/Time   CHOL 169 11/26/2019 0854   TRIG 236 (H) 11/26/2019 0854   HDL 50 11/26/2019 0854   CHOLHDL 3.4 11/26/2019 0854   LDLCALC 80 11/26/2019 0854    Physical Exam:    VS:  BP (!) 144/76   Pulse 84  Ht 5\' 1"  (7.939 m)   Wt 136 lb (61.7 kg)   SpO2 98%   BMI 25.70 kg/m     Wt Readings from Last 3 Encounters:  05/28/20 136 lb (61.7 kg)  11/26/19 130 lb 6.4 oz (59.1 kg)  05/30/19 134 lb (60.8 kg)     GEN:  Well nourished, well developed in no acute distress HEENT: Normal NECK: No JVD; No carotid bruits LYMPHATICS: No lymphadenopathy CARDIAC: Irregular S1 variable no murmurs, rubs, gallops RESPIRATORY:  Clear to auscultation without rales, wheezing or rhonchi  ABDOMEN: Soft, non-tender, non-distended MUSCULOSKELETAL:  No edema; No deformity  SKIN: Warm and dry NEUROLOGIC:  Alert and oriented x 3 PSYCHIATRIC:  Normal affect    Signed, Shirlee More, MD  05/28/2020 8:28 AM    [facility]

## 2020-05-28 NOTE — Patient Instructions (Signed)
Medication Instructions:  Your physician recommends that you continue on your current medications as directed. Please refer to the Current Medication list given to you today.  Use a topical ibuprofen or tylenol max of 4 tablets daily.  *If you need a refill on your cardiac medications before your next appointment, please call your pharmacy*   Lab Work: Your physician recommends that you return for lab work in: TODAY CMP, Lipids, ProBNP If you have labs (blood work) drawn today and your tests are completely normal, you will receive your results only by: Marland Kitchen MyChart Message (if you have MyChart) OR . A paper copy in the mail If you have any lab test that is abnormal or we need to change your treatment, we will call you to review the results.   Testing/Procedures: None   Follow-Up: At [facility], you and your health needs are our priority.  As part of our continuing mission to provide you with exceptional heart care, we have created designated Provider Care Teams.  These Care Teams include your primary Cardiologist (physician) and Advanced Practice Providers (APPs -  Physician Assistants and Nurse Practitioners) who all work together to provide you with the care you need, when you need it.  We recommend signing up for the patient portal called "MyChart".  Sign up information is provided on this After Visit Summary.  MyChart is used to connect with patients for Virtual Visits (Telemedicine).  Patients are able to view lab/test results, encounter notes, upcoming appointments, etc.  Non-urgent messages can be sent to your provider as well.   To learn more about what you can do with MyChart, go to [email or web address].    Your next appointment:   4 month(s)  The format for your next appointment:   In Person  Provider:   Shirlee More, MD   Other Instructions

## 2020-05-29 LAB — COMPREHENSIVE METABOLIC PANEL
ALT: 20 IU/L (ref 0–32)
AST: 22 IU/L (ref 0–40)
Albumin/Globulin Ratio: 1.6 (ref 1.2–2.2)
Albumin: 4 g/dL (ref 3.6–4.6)
Alkaline Phosphatase: 66 IU/L (ref 48–121)
BUN/Creatinine Ratio: 25 (ref 12–28)
BUN: 23 mg/dL (ref 8–27)
Bilirubin Total: 0.5 mg/dL (ref 0.0–1.2)
CO2: 26 mmol/L (ref 20–29)
Calcium: 9.4 mg/dL (ref 8.7–10.3)
Chloride: 98 mmol/L (ref 96–106)
Creatinine, Ser: 0.91 mg/dL (ref 0.57–1.00)
GFR calc Af Amer: 67 mL/min/{1.73_m2} (ref >59)
GFR calc non Af Amer: 59 mL/min/{1.73_m2} — ABNORMAL LOW (ref >59)
Globulin, Total: 2.5 g/dL (ref 1.5–4.5)
Glucose: 172 mg/dL — ABNORMAL HIGH (ref 65–99)
Potassium: 3.9 mmol/L (ref 3.5–5.2)
Sodium: 140 mmol/L (ref 134–144)
Total Protein: 6.5 g/dL (ref 6.0–8.5)

## 2020-05-29 LAB — LIPID PANEL
Chol/HDL Ratio: 3.1 ratio (ref 0.0–4.4)
Cholesterol, Total: 139 mg/dL (ref 100–199)
HDL: 45 mg/dL (ref >39)
LDL Chol Calc (NIH): 67 mg/dL (ref 0–99)
Triglycerides: 155 mg/dL — ABNORMAL HIGH (ref 0–149)
VLDL Cholesterol Cal: 27 mg/dL (ref 5–40)

## 2020-05-29 LAB — PRO B NATRIURETIC PEPTIDE: NT-Pro BNP: 1192 pg/mL — ABNORMAL HIGH (ref 0–738)

## 2020-06-01 ENCOUNTER — Telehealth: Payer: Self-pay

## 2020-06-01 NOTE — Telephone Encounter (Signed)
-----   Message from Richardo Priest, MD sent at 06/01/2020  7:27 AM EDT ----- Normal or stable result  No changes

## 2020-06-01 NOTE — Telephone Encounter (Signed)
Spoke with patient regarding results and recommendation.  Patient verbalizes understanding and is agreeable to plan of care. Advised patient to call back with any issues or concerns.  

## 2020-07-20 DIAGNOSIS — E039 Hypothyroidism, unspecified: Secondary | ICD-10-CM | POA: Diagnosis not present

## 2020-07-20 DIAGNOSIS — E119 Type 2 diabetes mellitus without complications: Secondary | ICD-10-CM | POA: Diagnosis not present

## 2020-07-24 DIAGNOSIS — Z23 Encounter for immunization: Secondary | ICD-10-CM | POA: Diagnosis not present

## 2020-07-26 DIAGNOSIS — E119 Type 2 diabetes mellitus without complications: Secondary | ICD-10-CM | POA: Diagnosis not present

## 2020-07-26 DIAGNOSIS — E039 Hypothyroidism, unspecified: Secondary | ICD-10-CM | POA: Diagnosis not present

## 2020-07-26 DIAGNOSIS — Z6826 Body mass index (BMI) 26.0-26.9, adult: Secondary | ICD-10-CM | POA: Diagnosis not present

## 2020-08-04 ENCOUNTER — Encounter: Payer: Self-pay | Admitting: Cardiology

## 2020-08-11 ENCOUNTER — Other Ambulatory Visit: Payer: Self-pay | Admitting: Cardiology

## 2020-08-31 ENCOUNTER — Other Ambulatory Visit: Payer: Self-pay

## 2020-08-31 ENCOUNTER — Ambulatory Visit: Payer: Medicare Other | Admitting: Oncology

## 2020-08-31 NOTE — Telephone Encounter (Signed)
Refill denied for patient's Famotidine to [facility].  Needs to be sent to PCP.

## 2020-09-17 DIAGNOSIS — I2510 Atherosclerotic heart disease of native coronary artery without angina pectoris: Secondary | ICD-10-CM | POA: Insufficient documentation

## 2020-09-17 DIAGNOSIS — E119 Type 2 diabetes mellitus without complications: Secondary | ICD-10-CM | POA: Insufficient documentation

## 2020-09-17 DIAGNOSIS — I10 Essential (primary) hypertension: Secondary | ICD-10-CM | POA: Insufficient documentation

## 2020-09-27 NOTE — Progress Notes (Signed)
Cardiology Office Note:    Date:  09/28/2020   ID:  Angel Gentry, Angel Gentry 1935-07-03, MRN [id number]  PCP:  Ronita Hipps, MD  Cardiologist:  Shirlee More, MD    Referring MD: Ronita Hipps, MD    ASSESSMENT:    1. Chronic diastolic congestive heart failure ([place])   2. Hypertensive heart disease with heart failure ([place])   3. Chronic atrial fibrillation ([place])   4. Chronic anticoagulation   5. Coronary artery disease involving native coronary artery of native heart without angina pectoris   6. Dyslipidemia    PLAN:    In order of problems listed above:  1. Angel Gentry continues to do well weight is stable blood pressure at home runs 1 20-1 30 over mid 70s heart rates 70 to 80 bpm.  Heart failure is compensated New York Heart Association class I EF is normal continue her medical therapy including Bumex ARB and beta-blocker.  Labs are followed in her PCP office 2. A. fib rate controlled continue beta-blocker and reduced dose anticoagulant 3. Stable CAD no anginal discomfort continue medical therapy at this time I would not advise an ischemia evaluation 4. Stable lipids are ideal continue her intermediate intensity statin   Next appointment: 6 months   Medication Adjustments/Labs and Tests Ordered: Current medicines are reviewed at length with the patient today.  Concerns regarding medicines are outlined above.  No orders of the defined types were placed in this encounter.  No orders of the defined types were placed in this encounter.   Chief Complaint  Patient presents with  . Follow-up  . Congestive Heart Failure  . Atrial Fibrillation    History of Present Illness:    Angel Gentry is a 85 y.o. female with a hx of CAD with CABG, heart failure, persistent atrial fibrillation anticoagulated with apixaban hypertension hyperlipidemia.  Her LV ejection fraction percent was 55 to 60% 12/10/2018.  She was last seen 05/28/2020. Compliance with diet,  lifestyle and medications: Yes  She continues to do well weight is stable heart rate and blood pressure are in range.  She does not experienced any illness related to Covid has had full vaccination.  No angina shortness of breath edema chest pain palpitation or syncope.  She has had no bleeding from her anticoagulant.  She has had no muscle pain or weakness from her statin. Past Medical History:  Diagnosis Date  . CAD in native artery   . Chronic atrial fibrillation ([place]) 10/03/2016  . Chronic diastolic heart failure ([place]) 11/06/2016  . Coronary artery disease involving native coronary artery of native heart without angina pectoris 07/28/2015  . Diabetes mellitus due to underlying condition with unspecified complications ([place]) 0/16/0109  . Dyslipidemia   . Essential hypertension   . Hx of CABG 10/03/2016   Overview:  2006  . Hypertensive heart disease with heart failure ([place]) 07/28/2015  . Hyponatremia with decreased serum osmolality 07/31/2017  . Mitral regurgitation 02/10/2018   moderate  . Type 2 diabetes mellitus ([place])     Past Surgical History:  Procedure Laterality Date  . CAROTID ENDARTERECTOMY    . CATARACT EXTRACTION    . CORONARY ARTERY BYPASS GRAFT      Current Medications: Current Meds  Medication Sig  . apixaban (ELIQUIS) 2.5 MG TABS tablet Take 1 tablet (2.5 mg total) by mouth 2 (two) times daily.  . bumetanide (BUMEX) 1 MG tablet Take 2 tablets (2 mg) in the morning and 1.5 tablets (1.5 mg)  in the evening daily. (Patient taking differently: 1 mg. Take 2 tablets (2 mg) in the morning and 2 tablets (2 mg) evening)  . famotidine (PEPCID) 20 MG tablet Take 1 tablet (20 mg total) by mouth daily.  Marland Kitchen levothyroxine (SYNTHROID) 75 MCG tablet Take 75 mcg by mouth daily.   Marland Kitchen losartan (COZAAR) 50 MG tablet Take 50 mg by mouth daily.  . metoprolol succinate (TOPROL-XL) 50 MG 24 hr tablet Take 50 mg by mouth daily.  . Multiple Vitamin (MULTIVITAMIN) capsule Take 1 capsule by mouth  daily.   . nitroGLYCERIN (NITROSTAT) 0.4 MG SL tablet Place 1 tablet (0.4 mg total) under the tongue every 5 (five) minutes as needed for chest pain.  . potassium chloride SA (K-DUR,KLOR-CON) 20 MEQ tablet Take 20 mEq by mouth daily.  . simvastatin (ZOCOR) 10 MG tablet Take 10 mg by mouth daily.  . Vitamin D, Ergocalciferol, (DRISDOL) 50000 units CAPS capsule TAKE ONE CAPSULE BY MOUTH EVERY TWO WEEKS     Allergies:   Nabumetone and Sulfa antibiotics   Social History   Socioeconomic History  . Marital status: Married    Spouse name: Not on file  . Number of children: Not on file  . Years of education: Not on file  . Highest education level: Not on file  Occupational History  . Not on file  Tobacco Use  . Smoking status: Never Smoker  . Smokeless tobacco: Never Used  Vaping Use  . Vaping Use: Never used  Substance and Sexual Activity  . Alcohol use: Not Currently  . Drug use: Never  . Sexual activity: Not on file  Other Topics Concern  . Not on file  Social History Narrative  . Not on file   Social Determinants of Health   Financial Resource Strain:   . Difficulty of Paying Living Expenses: Not on file  Food Insecurity:   . Worried About Charity fundraiser in the Last Year: Not on file  . Ran Out of Food in the Last Year: Not on file  Transportation Needs:   . Lack of Transportation (Medical): Not on file  . Lack of Transportation (Non-Medical): Not on file  Physical Activity:   . Days of Exercise per Week: Not on file  . Minutes of Exercise per Session: Not on file  Stress:   . Feeling of Stress : Not on file  Social Connections:   . Frequency of Communication with Friends and Family: Not on file  . Frequency of Social Gatherings with Friends and Family: Not on file  . Attends Religious Services: Not on file  . Active Member of Clubs or Organizations: Not on file  . Attends Archivist Meetings: Not on file  . Marital Status: Not on file     Family  History: The patient's family history includes Cancer in her sister; Heart disease in her brother and mother. ROS:   Please see the history of present illness.    All other systems reviewed and are negative.  EKGs/Labs/Other Studies Reviewed:    The following studies were reviewed today:    Recent Labs: 07/20/2020: A1c 6.7% Creatinine 1.0 GFR greater than 60 cc sodium 143 potassium 3.9 Cholesterol at target 162 LDL 82 triglycerides 189 HDL 42 TSH T4 normal.  11/26/2019: Hemoglobin 12.9; Platelets 160; TSH 0.322 05/28/2020: ALT 20; BUN 23; Creatinine, Ser 0.91; NT-Pro BNP 1,192; Potassium 3.9; Sodium 140  Recent Lipid Panel    Component Value Date/Time   CHOL 139 05/28/2020  0835   TRIG 155 (H) 05/28/2020 0835   HDL 45 05/28/2020 0835   CHOLHDL 3.1 05/28/2020 0835   LDLCALC 67 05/28/2020 0835    Physical Exam:    VS:  BP (!) 144/64   Pulse 94   Ht 5\' 1"  (1.549 m)   Wt 135 lb 12.8 oz (61.6 kg)   SpO2 96%   BMI 25.66 kg/m     Wt Readings from Last 3 Encounters:  09/28/20 135 lb 12.8 oz (61.6 kg)  05/28/20 136 lb (61.7 kg)  11/26/19 130 lb 6.4 oz (59.1 kg)     GEN:  Well nourished, well developed in no acute distress HEENT: Normal NECK: No JVD; No carotid bruits LYMPHATICS: No lymphadenopathy CARDIAC: S1 is irregular rhythm is irregular no murmurs, rubs, gallops RESPIRATORY:  Clear to auscultation without rales, wheezing or rhonchi  ABDOMEN: Soft, non-tender, non-distended MUSCULOSKELETAL:  No edema; No deformity  SKIN: Warm and dry NEUROLOGIC:  Alert and oriented x 3 PSYCHIATRIC:  Normal affect    Signed, Shirlee More, MD  09/28/2020 2:36 PM    [facility]

## 2020-09-28 ENCOUNTER — Ambulatory Visit: Payer: Medicare Other | Admitting: Cardiology

## 2020-09-28 ENCOUNTER — Other Ambulatory Visit: Payer: Self-pay

## 2020-09-28 ENCOUNTER — Ambulatory Visit (INDEPENDENT_AMBULATORY_CARE_PROVIDER_SITE_OTHER): Payer: Medicare Other | Admitting: Cardiology

## 2020-09-28 ENCOUNTER — Encounter: Payer: Self-pay | Admitting: Cardiology

## 2020-09-28 VITALS — BP 144/64 | HR 94 | Ht 61.0 in | Wt 135.8 lb

## 2020-09-28 DIAGNOSIS — I5032 Chronic diastolic (congestive) heart failure: Secondary | ICD-10-CM | POA: Diagnosis not present

## 2020-09-28 DIAGNOSIS — I110 Hypertensive heart disease with heart failure: Secondary | ICD-10-CM | POA: Diagnosis not present

## 2020-09-28 DIAGNOSIS — E785 Hyperlipidemia, unspecified: Secondary | ICD-10-CM

## 2020-09-28 DIAGNOSIS — I482 Chronic atrial fibrillation, unspecified: Secondary | ICD-10-CM | POA: Diagnosis not present

## 2020-09-28 DIAGNOSIS — Z7901 Long term (current) use of anticoagulants: Secondary | ICD-10-CM

## 2020-09-28 DIAGNOSIS — I2510 Atherosclerotic heart disease of native coronary artery without angina pectoris: Secondary | ICD-10-CM | POA: Diagnosis not present

## 2020-09-28 MED ORDER — FAMOTIDINE 20 MG PO TABS: 20.0000 mg | ORAL_TABLET | Freq: Every day | ORAL | 5 refills | Status: DC

## 2020-09-28 NOTE — Patient Instructions (Signed)

## 2020-09-29 NOTE — Progress Notes (Signed)
[facility]  [address] [place],  [place]  [address] [phone]  Clinic Day:  09/30/2020  Referring physician: Ronita Hipps, MD   HISTORY OF PRESENT ILLNESS:  The patient is a 85 y.o. female with stage IA (T1a N0 M0) hormone positive breast cancer, status post a lumpectomy in November 2014. This was followed by adjuvant breast radiation, which was completed in February 2015.  The patient also completed 5 years of anastrozole for her adjuvant endocrine therapy.  She comes in today for routine follow up.  Since her last visit, the patient has been doing well.  She continues to deny having any particular changes in her breasts which concern her for disease recurrence.  Her last mammogram in January 2021 continued to show no evidence of recurrent breast cancer.  PHYSICAL EXAM:  Blood pressure (!) 184/80, pulse 78, temperature 97.9 F (36.6 C), resp. rate 14, height 5\' 1"  (1.549 m), weight 136 lb 9.6 oz (62 kg), SpO2 96 %. Wt Readings from Last 3 Encounters:  09/30/20 136 lb 9.6 oz (62 kg)  09/28/20 135 lb 12.8 oz (61.6 kg)  05/28/20 136 lb (61.7 kg)   Body mass index is 25.81 kg/m. Performance status (ECOG): 2 Physical Exam Constitutional:      Appearance: Normal appearance.  HENT:     Mouth/Throat:     Pharynx: Oropharynx is clear. No oropharyngeal exudate.  Cardiovascular:     Rate and Rhythm: Normal rate and regular rhythm.     Heart sounds: No murmur heard.  No friction rub. No gallop.   Pulmonary:     Breath sounds: Normal breath sounds.  Chest:     Breasts:        Right: No swelling, bleeding, inverted nipple, mass, nipple discharge or skin change.        Left: No swelling, bleeding, inverted nipple, mass, nipple discharge or skin change.  Abdominal:     General: Bowel sounds are normal. There is no distension.     Palpations: Abdomen is soft. There is no mass.     Tenderness: There is no abdominal tenderness.  Musculoskeletal:         General: No tenderness.     Cervical back: Normal range of motion and neck supple.     Right lower leg: No edema.     Left lower leg: No edema.  Lymphadenopathy:     Cervical: No cervical adenopathy.     Right cervical: No superficial, deep or posterior cervical adenopathy.    Left cervical: No superficial, deep or posterior cervical adenopathy.     Upper Body:     Right upper body: No supraclavicular or axillary adenopathy.     Left upper body: No supraclavicular or axillary adenopathy.     Lower Body: No right inguinal adenopathy. No left inguinal adenopathy.  Skin:    Coloration: Skin is not jaundiced.     Findings: No lesion or rash.  Neurological:     General: No focal deficit present.     Mental Status: She is alert and oriented to person, place, and time. Mental status is at baseline.  Psychiatric:        Mood and Affect: Mood normal.        Behavior: Behavior normal.        Thought Content: Thought content normal.        Judgment: Judgment normal.     ASSESSMENT & PLAN:   Assessment/Plan:  A 85 y.o.  female with stage IA hormone positive breast cancer, status post a lumpectomy in November 2014.  Based upon her clinical breast exam today, the patient remains disease-free.  With respect to her radiographic breast cancer surveillance, she will be scheduled to undergo her annual mammogram next month.  Otherwise, as she is doing well, I will see her back in another year for repeat clinical assessment .The patient understands all the plans discussed today and is in agreement with them.      Angel Merle Macarthur Critchley, MD

## 2020-09-30 ENCOUNTER — Telehealth: Payer: Self-pay | Admitting: Oncology

## 2020-09-30 ENCOUNTER — Encounter: Payer: Self-pay | Admitting: Oncology

## 2020-09-30 ENCOUNTER — Other Ambulatory Visit: Payer: Self-pay | Admitting: Oncology

## 2020-09-30 ENCOUNTER — Inpatient Hospital Stay: Payer: Medicare Other | Attending: Oncology | Admitting: Oncology

## 2020-09-30 ENCOUNTER — Other Ambulatory Visit: Payer: Self-pay

## 2020-09-30 VITALS — BP 184/80 | HR 78 | Temp 97.9°F | Resp 14 | Ht 61.0 in | Wt 136.6 lb

## 2020-09-30 DIAGNOSIS — Z170 Estrogen receptor positive status [ER+]: Secondary | ICD-10-CM | POA: Diagnosis not present

## 2020-09-30 DIAGNOSIS — I2510 Atherosclerotic heart disease of native coronary artery without angina pectoris: Secondary | ICD-10-CM

## 2020-09-30 DIAGNOSIS — C50211 Malignant neoplasm of upper-inner quadrant of right female breast: Secondary | ICD-10-CM

## 2020-09-30 NOTE — Telephone Encounter (Signed)
12/2 mammogram order faxed to sched/db

## 2020-09-30 NOTE — Telephone Encounter (Signed)
Per 12/1 los 94yr f/u appt given to patient

## 2020-11-23 ENCOUNTER — Encounter: Payer: Self-pay | Admitting: Oncology

## 2020-11-26 DIAGNOSIS — L03039 Cellulitis of unspecified toe: Secondary | ICD-10-CM | POA: Diagnosis not present

## 2020-11-29 DIAGNOSIS — Z1231 Encounter for screening mammogram for malignant neoplasm of breast: Secondary | ICD-10-CM | POA: Diagnosis not present

## 2020-12-28 DIAGNOSIS — Z79899 Other long term (current) drug therapy: Secondary | ICD-10-CM | POA: Diagnosis not present

## 2020-12-28 DIAGNOSIS — E7800 Pure hypercholesterolemia, unspecified: Secondary | ICD-10-CM | POA: Diagnosis not present

## 2020-12-28 DIAGNOSIS — E039 Hypothyroidism, unspecified: Secondary | ICD-10-CM | POA: Diagnosis not present

## 2020-12-28 DIAGNOSIS — E119 Type 2 diabetes mellitus without complications: Secondary | ICD-10-CM | POA: Diagnosis not present

## 2020-12-31 DIAGNOSIS — Z1331 Encounter for screening for depression: Secondary | ICD-10-CM | POA: Diagnosis not present

## 2020-12-31 DIAGNOSIS — Z6826 Body mass index (BMI) 26.0-26.9, adult: Secondary | ICD-10-CM | POA: Diagnosis not present

## 2020-12-31 DIAGNOSIS — Z0000 Encounter for general adult medical examination without abnormal findings: Secondary | ICD-10-CM | POA: Diagnosis not present

## 2020-12-31 DIAGNOSIS — E119 Type 2 diabetes mellitus without complications: Secondary | ICD-10-CM | POA: Diagnosis not present

## 2021-03-10 ENCOUNTER — Other Ambulatory Visit: Payer: Self-pay | Admitting: Cardiology

## 2021-03-28 NOTE — Progress Notes (Signed)
Cardiology Office Note:    Date:  03/29/2021   ID:  [place], [place] [birth year], MRN [id number]  PCP:  Ronita Hipps, MD  Cardiologist:  Shirlee More, MD    Referring MD: Ronita Hipps, MD    ASSESSMENT:    1. Chronic diastolic congestive heart failure ([place])   2. Hypertensive heart disease with heart failure ([place])   3. Chronic atrial fibrillation ([place])   4. Chronic anticoagulation   5. Coronary artery disease involving native coronary artery of native heart without angina pectoris   6. Dyslipidemia    PLAN:    In order of problems listed above:  1. She continues to do well from a cardiac perspective, heart failure is compensated she will continue her current loop diuretic with stable renal function for age and potassium is normal with supplementation 2. Blood pressure target continue current treatment including beta-blocker and ARB 3. Rate is controlled with atrial fibrillation continue beta-blocker and current anticoagulant reduced dose Eliquis stable CAD having no anginal discomfort continue medical therapy 4. Lipids at target continue [place]or intermediate intensity statin   Next appointment: 6 months   Medication Adjustments/Labs and Tests Ordered: Current medicines are reviewed at length with the patient today.  Concerns regarding medicines are outlined above.  No orders of the defined types were placed in this encounter.  No orders of the defined types were placed in this encounter.   Chief Complaint  Patient presents with  . Follow-up    History of Present Illness:    Angel Gentry is a 86 y.o. female with a hx of chronic diastolic heart failure, hypertensive heart disease with heart failure, chronic atrial fibrillation with anticoagulation dyslipidemia and coronary artery disease with a history of CABG.  Her last ejection fraction 55 to 60% February 2020.  She was last seen 09/28/2020.  Compliance with diet, lifestyle and  medications: Yes  She continues to do well her weight never changes in her home blood pressure and heart rate are in desired range. She finds her self to be less active. She is concerned about her and her husband's health. Her heart failure is compensated no edema shortness of breath, atrial fibrillation is rate controlled no palpitation or syncope she has had no neurologic events and tolerates her anticoagulant without bleeding complication. Recent laboratory test performed 12/28/2020 are at target cholesterol 176 LDL 88 triglycerides 198 HDL 48 A1c 7% hemoglobin 12.3 creatinine 1.0 GFR 54 cc potassium 4.2  Past Medical History:  Diagnosis Date  . CAD in native artery   . Chronic atrial fibrillation ([place]) 10/03/2016  . Chronic diastolic heart failure ([place]) 11/06/2016  . Coronary artery disease involving native coronary artery of native heart without angina pectoris 07/28/2015  . Diabetes mellitus due to underlying condition with unspecified complications ([place]) 5/88/5027  . Dyslipidemia   . Essential hypertension   . Hx of CABG 10/03/2016   Overview:  2006  . Hypertensive heart disease with heart failure ([place]) 07/28/2015  . Hyponatremia with decreased serum osmolality 07/31/2017  . Mitral regurgitation 02/10/2018   moderate  . Type 2 diabetes mellitus ([place])     Past Surgical History:  Procedure Laterality Date  . CAROTID ENDARTERECTOMY    . CATARACT EXTRACTION    . CORONARY ARTERY BYPASS GRAFT      Current Medications: Current Meds  Medication Sig  . apixaban (ELIQUIS) 2.5 MG TABS tablet Take 1 tablet (2.5 mg total) by mouth 2 (two) times daily.  Marland Kitchen  bumetanide (BUMEX) 1 MG tablet Take 2 mg by mouth 2 (two) times daily.  . famotidine (PEPCID) 20 MG tablet Take 1 tablet (20 mg total) by mouth daily.  Marland Kitchen levothyroxine (SYNTHROID) 75 MCG tablet Take 75 mcg by mouth daily.   Marland Kitchen losartan (COZAAR) 50 MG tablet Take 50 mg by mouth daily.  . metoprolol succinate (TOPROL-XL) 50 MG 24 hr tablet  Take 50 mg by mouth daily.  . Multiple Vitamin (MULTIVITAMIN) capsule Take 1 capsule by mouth daily.   . nitroGLYCERIN (NITROSTAT) 0.4 MG SL tablet Place 1 tablet (0.4 mg total) under the tongue every 5 (five) minutes as needed for chest pain.  . potassium chloride SA (K-DUR,KLOR-CON) 20 MEQ tablet Take 20 mEq by mouth daily.  . simvastatin (ZOCOR) 10 MG tablet Take 10 mg by mouth daily.  . Vitamin D, Ergocalciferol, (DRISDOL) 50000 units CAPS capsule TAKE ONE CAPSULE BY MOUTH EVERY TWO WEEKS     Allergies:   Nabumetone and Sulfa antibiotics   Social History   Socioeconomic History  . Marital status: Married    Spouse name: Not on file  . Number of children: Not on file  . Years of education: Not on file  . Highest education level: Not on file  Occupational History  . Not on file  Tobacco Use  . Smoking status: Never Smoker  . Smokeless tobacco: Never Used  Vaping Use  . Vaping Use: Never used  Substance and Sexual Activity  . Alcohol use: Not Currently  . Drug use: Never  . Sexual activity: Not on file  Other Topics Concern  . Not on file  Social History Narrative  . Not on file   Social Determinants of Health   Financial Resource Strain: Not on file  Food Insecurity: Not on file  Transportation Needs: Not on file  Physical Activity: Not on file  Stress: Not on file  Social Connections: Not on file     Family History: The patient's family history includes Cancer in her sister; Heart disease in her brother and mother. ROS:   Please see the history of present illness.    All other systems reviewed and are negative.  EKGs/Labs/Other Studies Reviewed:    The following studies were reviewed today:  EKG:  EKG ordered today and personally reviewed.  The ekg ordered today demonstrates atrial fibrillation controlled rate diffuse ST-T abnormality unchanged from January 2021  Recent Labs: 05/28/2020: ALT 20; BUN 23; Creatinine, Ser 0.91; NT-Pro BNP 1,192; Potassium 3.9;  Sodium 140  Recent Lipid Panel    Component Value Date/Time   CHOL 139 05/28/2020 0835   TRIG 155 (H) 05/28/2020 0835   HDL 45 05/28/2020 0835   CHOLHDL 3.1 05/28/2020 0835   LDLCALC 67 05/28/2020 0835    Physical Exam:    VS:  BP (!) 168/78 (BP Location: Right Arm)   Pulse 92   Ht 5\' 1"  (1.549 m)   Wt 135 lb 12.8 oz (61.6 kg)   SpO2 98%   BMI 25.66 kg/m     Wt Readings from Last 3 Encounters:  03/29/21 135 lb 12.8 oz (61.6 kg)  09/30/20 136 lb 9.6 oz (62 kg)  09/28/20 135 lb 12.8 oz (61.6 kg)     GEN: She looks her age well nourished, well developed in no acute distress HEENT: Normal NECK: No JVD; No carotid bruits LYMPHATICS: No lymphadenopathy CARDIAC: Irregular rhythm variable first heart sound , no murmurs, rubs, gallops RESPIRATORY:  Clear to auscultation without rales, wheezing  or rhonchi  ABDOMEN: Soft, non-tender, non-distended MUSCULOSKELETAL:  No edema; No deformity  SKIN: Warm and dry NEUROLOGIC:  Alert and oriented x 3 PSYCHIATRIC:  Normal affect    Signed, Shirlee More, MD  03/29/2021 1:39 PM    [facility]

## 2021-03-29 ENCOUNTER — Encounter: Payer: Self-pay | Admitting: Cardiology

## 2021-03-29 ENCOUNTER — Other Ambulatory Visit: Payer: Self-pay

## 2021-03-29 ENCOUNTER — Other Ambulatory Visit: Payer: Self-pay | Admitting: Cardiology

## 2021-03-29 ENCOUNTER — Ambulatory Visit (INDEPENDENT_AMBULATORY_CARE_PROVIDER_SITE_OTHER): Payer: Medicare Other | Admitting: Cardiology

## 2021-03-29 VITALS — BP 168/78 | HR 92 | Ht 61.0 in | Wt 135.8 lb

## 2021-03-29 DIAGNOSIS — Z7901 Long term (current) use of anticoagulants: Secondary | ICD-10-CM | POA: Diagnosis not present

## 2021-03-29 DIAGNOSIS — I482 Chronic atrial fibrillation, unspecified: Secondary | ICD-10-CM | POA: Diagnosis not present

## 2021-03-29 DIAGNOSIS — E785 Hyperlipidemia, unspecified: Secondary | ICD-10-CM | POA: Diagnosis not present

## 2021-03-29 DIAGNOSIS — I5032 Chronic diastolic (congestive) heart failure: Secondary | ICD-10-CM

## 2021-03-29 DIAGNOSIS — I2510 Atherosclerotic heart disease of native coronary artery without angina pectoris: Secondary | ICD-10-CM

## 2021-03-29 DIAGNOSIS — I110 Hypertensive heart disease with heart failure: Secondary | ICD-10-CM | POA: Diagnosis not present

## 2021-03-29 DIAGNOSIS — Z170 Estrogen receptor positive status [ER+]: Secondary | ICD-10-CM

## 2021-03-29 NOTE — Patient Instructions (Signed)

## 2021-05-06 ENCOUNTER — Ambulatory Visit (INDEPENDENT_AMBULATORY_CARE_PROVIDER_SITE_OTHER): Payer: Medicare Other | Admitting: Cardiology

## 2021-05-06 ENCOUNTER — Other Ambulatory Visit: Payer: Self-pay

## 2021-05-06 ENCOUNTER — Encounter: Payer: Self-pay | Admitting: Cardiology

## 2021-05-06 ENCOUNTER — Telehealth: Payer: Self-pay | Admitting: Cardiology

## 2021-05-06 VITALS — BP 148/83 | HR 86 | Ht 61.0 in | Wt 137.0 lb

## 2021-05-06 DIAGNOSIS — E785 Hyperlipidemia, unspecified: Secondary | ICD-10-CM

## 2021-05-06 DIAGNOSIS — I5032 Chronic diastolic (congestive) heart failure: Secondary | ICD-10-CM

## 2021-05-06 DIAGNOSIS — I482 Chronic atrial fibrillation, unspecified: Secondary | ICD-10-CM | POA: Diagnosis not present

## 2021-05-06 DIAGNOSIS — I110 Hypertensive heart disease with heart failure: Secondary | ICD-10-CM | POA: Diagnosis not present

## 2021-05-06 DIAGNOSIS — Z7901 Long term (current) use of anticoagulants: Secondary | ICD-10-CM

## 2021-05-06 DIAGNOSIS — I2510 Atherosclerotic heart disease of native coronary artery without angina pectoris: Secondary | ICD-10-CM

## 2021-05-06 MED ORDER — NITROGLYCERIN 0.4 MG SL SUBL: 0.4000 mg | SUBLINGUAL_TABLET | SUBLINGUAL | 3 refills | Status: DC | PRN

## 2021-05-06 MED ORDER — TORSEMIDE 20 MG PO TABS: 20.0000 mg | ORAL_TABLET | Freq: Three times a day (TID) | ORAL | 0 refills | Status: DC

## 2021-05-06 NOTE — Progress Notes (Signed)
Cardiology Office Note:    Date:  05/06/2021   ID:  [place], [place] 02-Mar-1935, MRN [id number]  PCP:  Ronita Hipps, MD  Cardiologist:  Shirlee More, MD    Referring MD: Ronita Hipps, MD    ASSESSMENT:    1. Chronic diastolic congestive heart failure ([place])   2. Hypertensive heart disease with heart failure ([place])   3. Chronic atrial fibrillation ([place])   4. Chronic anticoagulation   5. Coronary artery disease involving native coronary artery of native heart without angina pectoris   6. Dyslipidemia    PLAN:    In order of problems listed above:  Although her weight has not dramatically changed she has marked fluid overload.  We will transition from furosemide to torsemide with a 50% dose equivalent to increase until Monday call the office with weights if she worsens over the weekend present to the emergency room.  I have given her prescription for nitroglycerin she can take if she has PND.  Also recheck ejection fraction of EF is reduced would change the approach continue her other guideline directed therapy.  Her atrial fibrillation stable rate controlled continue anticoagulation and statin for hyperlipidemia   Next appointment: 1 week   Medication Adjustments/Labs and Tests Ordered: Current medicines are reviewed at length with the patient today.  Concerns regarding medicines are outlined above.  No orders of the defined types were placed in this encounter.  No orders of the defined types were placed in this encounter.   Chief Complaint  Patient presents with   Shortness of Breath    History of Present Illness:    Angel Gentry is a 86 y.o. female with a hx of chronic diastolic heart failure, hypertensive heart disease with heart failure, chronic atrial fibrillation with anticoagulation dyslipidemia and coronary artery disease with a history of CABG.  Her last ejection fraction 55 to 60% February 2020.  She was last seen 03/29/2021.  She  is meticulous in her care.  She underwent left heart catheterization 03/02/2011 and showed normal left ventricular function left thoracic artery graft to the LAD was patent vein graft to the diagonal was patent vein graft to the distal right coronary artery and PDA was patent and vein graft to distal circumflex patent which also filled the obtuse marginal branch.  Her left main coronary artery was free of disease LAD was occluded right coronary artery was subtotally occluded and left circumflex coronary artery was occluded.  She required no intervention at that time.  Compliance with diet, lifestyle and medications: Yes  She has noticed recently she does not seem to get a response to her furosemide taking 40 mg twice daily Her weight is only up a couple pounds approximately 3 but she is developed marked edema she had orthopnea and PND last night and she has had a nonproductive cough. No chest pain palpitation or syncope. No fever or chills and she is vaccinated and boosted and no COVID exposure.  She is seen today at her request with a complaint of shortness of breath orthopnea weight gain and edema and is worked into the office schedule.  NAMELYNDSI, ALTIC                 ACCOUNT NO.:  [id number]    MEDICAL RECORD NO.:  [id number]          PATIENT TYPE:  OIB    LOCATION:  2856  FACILITY:  [facility]    PHYSICIAN:  Loretha Brasil. Lia Foyer, MD, FACCDATE OF BIRTH:  11/18/1934    DATE OF PROCEDURE:  DATE OF DISCHARGE:                             CARDIAC CATHETERIZATION    INDICATIONS:  Ms. Angel Gentry is a delightful 86 year old who has undergone  revascularization surgery.  She had follow-up with Dr. Geraldo Pitter.  She  had a normal myocardial perfusion study, but significant ST depression  that persisted.  Because of her diabetic status, she was referred for  repeat cardiac catheterization.    PROCEDURE:  1. Left heart catheterization  2. Selective coronary arteriography.  3.  Selective left ventriculography.  4. Saphenous vein graft angiography x3.  5. Selective left internal mammary angiography.    DESCRIPTION OF PROCEDURE:  The patient was brought to the cath lab and  prepped and draped in usual fashion.  Through an anterior puncture, the  right femoral artery was easily entered.  The 5-French sheath was  placed.  Views of the left and right coronary arteries were obtained.  All three vein grafts were injected.  Internal mammary was injected.  Central aortic and left ventricular pressures were measured with a  pigtail. Ventriculography was then performed in the RAO projection.  There were no complications.  She tolerated the procedure well and was  taken to the holding area in satisfactory clinical condition.  I took  pictures to her family, and reviewed the findings with her husband.    She was taken then to the day cath area for convalescence.    HEMODYNAMIC DATA:  1. Central aortic pressure 155/67, mean 84.  2. Left ventricular pressure 153/90.  3. On pullback across the aortic valve, there is a fair amount of      ectopy making the pullback unreliable.  On initial crossing, there      did not appear to be a significant gradient.    ANGIOGRAPHIC DATA:  1. Ventriculography in the RAO projection reveals vigorous global      systolic function without segmental wall motion abnormality.      Ejection fraction is in excess of 65%.  There was ventricular      ectopy, and as a result there was a trace mitral regurgitation,      although on the one sinus beat, there did not appear to be      significant mitral regurgitation.  2. The left main is free of critical disease.  3. The LAD is essentially occluded.  4. The right circumflex artery is occluded.  5. Flow into the diagonal is slow and competitive.  6. The right coronary artery is subtotally occluded in this      midportion.  7. The internal mammary to the distal LAD does appear to be intact.       There is mild irregularity in the internal mammary, but no areas of      high-grade obstruction.  8. The saphenous vein graft to the distal circumflex demonstrates what      appears to be likely an occlusion of the OM insertion.  The OM      itself is probably diffusely diseased and small in caliber.  The      vein graft continues smoothly to the distal circumflex which it      fills nicely.  The distal circumflex then retrograde fills the OM.  9. Saphenous  vein graft to the diagonal in multiple views appears to      have a stable insertion site.  It actually was implanted into a      smaller sub-branch, but fills the diagonal system well.  10.A saphenous vein graft to the distal right coronary/posterior      descending artery appears to be intact.  Both the PLA, and the PDA      fill nicely.  The PDA and is seen proximally and is occluded before      the graft insertion, when filling through the native vessel from      the proximal graft.    CONCLUSIONS:  1. Well-preserved left ventricular function.  2. Continued patency of the internal mammary to the LAD.  3. Continued patency of saphenous vein graft to the diagonal.  4. Continued patency of the saphenous vein graft to the distal right      coronary and posterior descending branch.  5. Continued patency of the saphenous vein graft to the distal      circumflex, which retrograde fills the obtuse marginal.    DISPOSITION:  At the present time, the patient will follow up with Dr.  Geraldo Pitter.  She likely can continue to be treated medically.  Overall,  her vein grafts appear well preserved.  The EKG change without perfusion  defect could be from ischemia in the OM territory, which fills  retrograde.  Per Dr. Geraldo Pitter, we will discontinue her Plavix at the  present time.   Echocardiogram was performed 12/10/2017 ejection fraction 55 to 60% she had moderate mitral regurgitation left atrium is moderately enlarged the right atrium severely  enlarged moderate tricuspid regurgitation and mild aortic regurgitation without stenosis.  Study Conclusions   - Left ventricle: The cavity size was normal. Systolic function was    normal. The estimated ejection fraction was in the range of 55%    to 60%. Wall motion was normal; there were no regional wall    motion abnormalities.  - Aortic valve: There was mild regurgitation. Valve area (VTI):    1.71 cm^2. Valve area (Vmax): 1.63 cm^2. Valve area (Vmean): 1.72    cm^2.  - Ascending aorta: The ascending aorta was moderately dilated.  - Mitral valve: Moderately calcified annulus. Normal thickness,    noncalcified leaflets . There was moderate regurgitation.  - Left atrium: The atrium was moderately dilated.  - Right ventricle: The cavity size was mildly dilated. Wall    thickness was normal. Systolic function was mildly reduced.  - Right atrium: The atrium was severely dilated.  - Tricuspid valve: There was moderate regurgitation.  - Pulmonary arteries: PA peak pressure: 48 mm Hg (S).  Past Medical History:  Diagnosis Date   CAD in native artery    Chronic atrial fibrillation ([place]) 10/03/2016   Chronic diastolic heart failure ([place]) 11/06/2016   Coronary artery disease involving native coronary artery of native heart without angina pectoris 07/28/2015   Diabetes mellitus due to underlying condition with unspecified complications ([place]) 1/60/1093   Dyslipidemia    Essential hypertension    Hx of CABG 10/03/2016   Overview:  2006   Hypertensive heart disease with heart failure ([place]) 07/28/2015   Hyponatremia with decreased serum osmolality 07/31/2017   Mitral regurgitation 02/10/2018   moderate   Type 2 diabetes mellitus (H[facility])     Past Surgical History:  Procedure Laterality Date   CAROTID ENDARTERECTOMY     CATARACT EXTRACTION     CORONARY ARTERY BYPASS GRAFT  Current Medications: Current Meds  Medication Sig   apixaban (ELIQUIS) 2.5 MG TABS tablet Take 1 tablet (2.5 mg  total) by mouth 2 (two) times daily.   bumetanide (BUMEX) 1 MG tablet Take 2 mg by mouth 2 (two) times daily.   famotidine (PEPCID) 20 MG tablet Take 1 tablet (20 mg total) by mouth daily.   levothyroxine (SYNTHROID) 75 MCG tablet Take 75 mcg by mouth daily.    losartan (COZAAR) 50 MG tablet Take 50 mg by mouth daily.   metoprolol succinate (TOPROL-XL) 50 MG 24 hr tablet Take 50 mg by mouth daily.   Multiple Vitamin (MULTIVITAMIN) capsule Take 1 capsule by mouth daily.    nitroGLYCERIN (NITROSTAT) 0.4 MG SL tablet Place 1 tablet (0.4 mg total) under the tongue every 5 (five) minutes as needed for chest pain.   potassium chloride SA (K-DUR,KLOR-CON) 20 MEQ tablet Take 20 mEq by mouth daily.   simvastatin (ZOCOR) 10 MG tablet Take 10 mg by mouth daily.   Vitamin D, Ergocalciferol, (DRISDOL) 50000 units CAPS capsule TAKE ONE CAPSULE BY MOUTH EVERY TWO WEEKS     Allergies:   Nabumetone and Sulfa antibiotics   Social History   Socioeconomic History   Marital status: Married    Spouse name: Not on file   Number of children: Not on file   Years of education: Not on file   Highest education level: Not on file  Occupational History   Not on file  Tobacco Use   Smoking status: Never   Smokeless tobacco: Never  Vaping Use   Vaping Use: Never used  Substance and Sexual Activity   Alcohol use: Not Currently   Drug use: Never   Sexual activity: Not on file  Other Topics Concern   Not on file  Social History Narrative   Not on file   Social Determinants of Health   Financial Resource Strain: Not on file  Food Insecurity: Not on file  Transportation Needs: Not on file  Physical Activity: Not on file  Stress: Not on file  Social Connections: Not on file     Family History: The patient's family history includes Cancer in her sister; Heart disease in her brother and mother. ROS:   Please see the history of present illness.    All other systems reviewed and are  negative.  EKGs/Labs/Other Studies Reviewed:    The following studies were reviewed today:   Ref Range & Units 11 mo ago  (05/28/20) 1 yr ago  (11/26/19) 1 yr ago  (05/30/19) 3 yr ago  (02/11/18)   NT-Pro BNP 0 - 738 pg/mL 1,192 High   995 High  CM  1,363 High  CM  1,299    Recent Labs: 05/28/2020: ALT 20; BUN 23; Creatinine, Ser 0.91; NT-Pro BNP 1,192; Potassium 3.9; Sodium 140  Recent Lipid Panel    Component Value Date/Time   CHOL 139 05/28/2020 0835   TRIG 155 (H) 05/28/2020 0835   HDL 45 05/28/2020 0835   CHOLHDL 3.1 05/28/2020 0835   LDLCALC 67 05/28/2020 0835    Physical Exam:    VS:  BP (!) 148/83 (BP Location: Left Arm, Patient Position: Sitting, Cuff Size: Normal)   Pulse 86   Ht 5\' 1"  (1.549 m)   Wt 137 lb (62.1 kg)   SpO2 98%   BMI 25.89 kg/m     Wt Readings from Last 3 Encounters:  05/06/21 137 lb (62.1 kg)  03/29/21 135 lb 12.8 oz (61.6 kg)  09/30/20  136 lb 9.6 oz (62 kg)     GEN:  Well nourished, well developed in no acute distress HEENT: Normal NECK: No JVD; No carotid bruits LYMPHATICS: No lymphadenopathy CARDIAC: Irregular rhythm third heart sound is present no murmurs, rubs, gallops RESPIRATORY:  Clear to auscultation without rales, wheezing or rhonchi  ABDOMEN: Soft, non-tender, non-distended MUSCULOSKELETAL: She has 3-4+ bilateral lower extremity ankle to knee pitting edema; No deformity  SKIN: Warm and dry NEUROLOGIC:  Alert and oriented x 3 PSYCHIATRIC:  Normal affect    Signed, Shirlee More, MD  05/06/2021 1:15 PM    [facility]

## 2021-05-06 NOTE — Addendum Note (Signed)
Addended by: Resa Miner I on: 05/06/2021 01:36 PM   Modules accepted: Orders

## 2021-05-06 NOTE — Addendum Note (Signed)
Addended by: Resa Miner I on: 05/06/2021 01:27 PM   Modules accepted: Orders

## 2021-05-06 NOTE — Patient Instructions (Addendum)
Medication Instructions:  Your physician has recommended you make the following change in your medication:  START: Torsemide take twice today. Then take one tablet by mouth three times daily until Monday. Call us on Monday and let us know your weight.  STOP: Furosemide  *If you need a refill on your cardiac medications before your next appointment, please call your pharmacy*   Lab Work: Your physician recommends that you return for lab work in: [place], BMP If you have labs (blood work) drawn today and your tests are completely normal, you will receive your results only by: [place] (if you have [place]) OR A paper copy in the mail If you have any lab test that is abnormal or we need to change your treatment, we will call you to review the results.   Testing/Procedures: Your physician has requested that you have an echocardiogram. Echocardiography is a painless test that uses sound waves to create images of your heart. It provides your doctor with information about the size and shape of your heart and how well your heart's chambers and valves are working. This procedure takes approximately one hour. There are no restrictions for this procedure.    Follow-Up: At [facility], you and your health needs are our priority.  As part of our continuing mission to provide you with exceptional heart care, we have created designated Provider Care Teams.  These Care Teams include your primary Cardiologist (physician) and Advanced Practice Providers (APPs -  Physician Assistants and Nurse Practitioners) who all work together to provide you with the care you need, when you need it.  We recommend signing up for the patient portal called "MyChart".  Sign up information is provided on this After Visit Summary.  MyChart is used to connect with patients for Virtual Visits (Telemedicine).  Patients are able to view lab/test results, encounter notes, upcoming appointments, etc.  Non-urgent messages  can be sent to your provider as well.   To learn more about what you can do with MyChart, go to [email or web address].    Your next appointment:   1 WEEK  The format for your next appointment:   In Person  Provider:   Shirlee More, MD   Other Instructions

## 2021-05-06 NOTE — Telephone Encounter (Signed)
Pt c/o Shortness Of Breath: STAT if SOB developed within the last 24 hours or pt is noticeably SOB on the phone  1. Are you currently SOB (can you hear that pt is SOB on the phone)? yes  2. How long have you been experiencing SOB? About a week. It woke her up this morning which is what made her worried   3. Are you SOB when sitting or when up moving around?   4. Are you currently experiencing any other symptoms? Swollen feet   Pt c/o swelling: STAT is pt has developed SOB within 24 hours  If swelling, where is the swelling located? Feet and legs   How much weight have you gained and in what time span? 3 lbs in a week  Have you gained 3 pounds in a day or 5 pounds in a week? 3 lbs in a week  Do you have a log of your daily weights (if so, list)?   Are you currently taking a fluid pill? yes  Are you currently SOB? Yes   Have you traveled recently? No   Patient is coming in to see Dr. Bettina Gavia today at 1:00. Lilia Pro is aware

## 2021-05-07 LAB — BASIC METABOLIC PANEL
BUN/Creatinine Ratio: 20 (ref 12–28)
BUN: 20 mg/dL (ref 8–27)
CO2: 27 mmol/L (ref 20–29)
Calcium: 9.8 mg/dL (ref 8.7–10.3)
Chloride: 98 mmol/L (ref 96–106)
Creatinine, Ser: 0.98 mg/dL (ref 0.57–1.00)
Glucose: 151 mg/dL — ABNORMAL HIGH (ref 65–99)
Potassium: 3.8 mmol/L (ref 3.5–5.2)
Sodium: 141 mmol/L (ref 134–144)
eGFR: 57 mL/min/{1.73_m2} — ABNORMAL LOW (ref >59)

## 2021-05-07 LAB — PRO B NATRIURETIC PEPTIDE: NT-Pro BNP: 1707 pg/mL — ABNORMAL HIGH (ref 0–738)

## 2021-05-09 ENCOUNTER — Telehealth: Payer: Self-pay | Admitting: Cardiology

## 2021-05-09 DIAGNOSIS — I5032 Chronic diastolic (congestive) heart failure: Secondary | ICD-10-CM | POA: Diagnosis not present

## 2021-05-09 NOTE — Addendum Note (Signed)
Addended by: Truddie Hidden on: 05/09/2021 11:38 AM   Modules accepted: Orders

## 2021-05-09 NOTE — Telephone Encounter (Signed)
Pt is calling to give her current weight 136 lbs, pt states she is down only 1 lb. Please advise pt further

## 2021-05-09 NOTE — Telephone Encounter (Signed)
Called patient to relay Dr. Joya Gaskins message. Patient verbalized understanding and will come in today to have labs drawn. No further  questions or concerns expressed at this time.

## 2021-05-10 ENCOUNTER — Telehealth: Payer: Self-pay

## 2021-05-10 LAB — PRO B NATRIURETIC PEPTIDE: NT-Pro BNP: 1224 pg/mL — ABNORMAL HIGH (ref 0–738)

## 2021-05-10 LAB — BASIC METABOLIC PANEL
BUN/Creatinine Ratio: 19 (ref 12–28)
BUN: 20 mg/dL (ref 8–27)
CO2: 27 mmol/L (ref 20–29)
Calcium: 9.6 mg/dL (ref 8.7–10.3)
Chloride: 99 mmol/L (ref 96–106)
Creatinine, Ser: 1.07 mg/dL — ABNORMAL HIGH (ref 0.57–1.00)
Glucose: 119 mg/dL — ABNORMAL HIGH (ref 65–99)
Potassium: 4 mmol/L (ref 3.5–5.2)
Sodium: 141 mmol/L (ref 134–144)
eGFR: 51 mL/min/{1.73_m2} — ABNORMAL LOW (ref >59)

## 2021-05-10 NOTE — Telephone Encounter (Signed)
Able to reach pt regarding her recent lab work, Dr. Bettina Gavia had a chance to review her results and advised   "Normal or stable result   This is a good result kidney function stable on the blood test for heartfailure improved in the last 3 days."   Mrs. Angel Gentry thankful for the phone call and good report. All Was able to answer her current questions, no additional concerns at this time.

## 2021-05-12 ENCOUNTER — Other Ambulatory Visit: Payer: Self-pay

## 2021-05-13 ENCOUNTER — Encounter: Payer: Self-pay | Admitting: Cardiology

## 2021-05-13 ENCOUNTER — Other Ambulatory Visit: Payer: Self-pay

## 2021-05-13 ENCOUNTER — Ambulatory Visit (INDEPENDENT_AMBULATORY_CARE_PROVIDER_SITE_OTHER): Payer: Medicare Other | Admitting: Cardiology

## 2021-05-13 VITALS — BP 140/60 | HR 80 | Ht 61.0 in | Wt 136.6 lb

## 2021-05-13 DIAGNOSIS — I482 Chronic atrial fibrillation, unspecified: Secondary | ICD-10-CM

## 2021-05-13 DIAGNOSIS — I5032 Chronic diastolic (congestive) heart failure: Secondary | ICD-10-CM

## 2021-05-13 DIAGNOSIS — Z7901 Long term (current) use of anticoagulants: Secondary | ICD-10-CM

## 2021-05-13 DIAGNOSIS — I110 Hypertensive heart disease with heart failure: Secondary | ICD-10-CM

## 2021-05-13 MED ORDER — TORSEMIDE 20 MG PO TABS: 40.0000 mg | ORAL_TABLET | Freq: Two times a day (BID) | ORAL | 3 refills | Status: DC

## 2021-05-13 NOTE — Patient Instructions (Addendum)
Medication Instructions:  Your physician has recommended you make the following change in your medication:   Increase your Torsemide to 40 mg (2 tablets) twice daily.  *If you need a refill on your cardiac medications before your next appointment, please call your pharmacy*   Lab Work: Your physician recommends that you have a BMP, CBC and ProBNP today in the lab.  If you have labs (blood work) drawn today and your tests are completely normal, you will receive your results only by: [place] (if you have MyChart) OR A paper copy in the mail If you have any lab test that is abnormal or we need to change your treatment, we will call you to review the results.   Testing/Procedures: None ordered   Follow-Up: At [facility], you and your health needs are our priority.  As part of our continuing mission to provide you with exceptional heart care, we have created designated Provider Care Teams.  These Care Teams include your primary Cardiologist (physician) and Advanced Practice Providers (APPs -  Physician Assistants and Nurse Practitioners) who all work together to provide you with the care you need, when you need it.  We recommend signing up for the patient portal called "MyChart".  Sign up information is provided on this After Visit Summary.  MyChart is used to connect with patients for Virtual Visits (Telemedicine).  Patients are able to view lab/test results, encounter notes, upcoming appointments, etc.  Non-urgent messages can be sent to your provider as well.   To learn more about what you can do with MyChart, go to [email or web address].    Your next appointment:   2 week(s)  The format for your next appointment:   In Person  Provider:   Shirlee More, MD   Other Instructions NA

## 2021-05-13 NOTE — Progress Notes (Signed)
Cardiology Office Note:    Date:  05/13/2021   ID:  [name], [place] Feb 04, 1935, MRN [id number]  PCP:  Angel Hipps, MD  Cardiologist:  Angel More, MD    Referring MD: Angel Hipps, MD    ASSESSMENT:    1. Chronic diastolic heart failure ([place])   2. Hypertensive heart disease with heart failure ([place])   3. Chronic atrial fibrillation ([place])   4. Chronic anticoagulation    PLAN:    In order of problems listed above:  Although her weight has not changed significantly she is symptomatically objectively improved physical exam and proBNP level if her ejection fraction is diminished recheck renal function proBNP change her torsemide to twice daily and follow-up again in 2 weeks after echo and if her ejection fraction is diminished transition to Entresto and SGLT2 inhibitor Stable rate is controlled continue beta-blocker anticoagulant For completeness check a CBC she is anticoagulated   Next appointment: 2 weeks   Medication Adjustments/Labs and Tests Ordered: Current medicines are reviewed at length with the patient today.  Concerns regarding medicines are outlined above.  No orders of the defined types were placed in this encounter.  No orders of the defined types were placed in this encounter.   Chief Complaint  Patient presents with   Follow-up   Congestive Heart Failure     History of Present Illness:    Angel Gentry is a 86 y.o. female with a hx of CAD with CABG hypertensive heart disease with chronic diastolic heart failure chronic atrial fibrillation with anticoagulation and dyslipidemia.  Her last ejection fraction February 2020 55 to 60%.  Last seen 05/06/2021 with decompensated heart failure very uncharacteristic for her.She underwent left heart catheterization 03/02/2011 and showed normal left ventricular function left thoracic artery graft to the LAD was patent vein graft to the diagonal was patent vein graft to the distal right  coronary artery and PDA was patent and vein graft to distal circumflex patent which also filled the obtuse marginal branch.  Her left main coronary artery was free of disease LAD was occluded right coronary artery was subtotally occluded and left circumflex coronary artery was occluded.  She required no intervention at that time.   Compliance with diet, lifestyle and medications: Yes  She is improved she has less edema she is not short of breath feels well.  Has no significant change in her body weight.  No chest pain palpitation or syncope  Initially she had a 50% increase in her proBNP level with clinical deterioration and she had returned to baseline when rechecked after 3 days of increased diuretic. Component Ref Range & Units 4 d ago  (05/09/21) 7 d ago  (05/06/21) 11 mo ago  (05/28/20) 1 yr ago  (11/26/19) 1 yr ago  (05/30/19) 3 yr ago  (02/11/18)  NT-Pro BNP 0 - 738 pg/mL 1,224 High   1,707 High  CM  1,192 High  CM  995 High  CM  1,363 High  CM  1,299   Past Medical History:  Diagnosis Date   CAD in native artery    Chronic atrial fibrillation ([place]) 10/03/2016   Chronic diastolic heart failure ([place]) 11/06/2016   Coronary artery disease involving native coronary artery of native heart without angina pectoris 07/28/2015   Diabetes mellitus due to underlying condition with unspecified complications ([place]) 04/28/5283   Dyslipidemia    Essential hypertension    Hx of CABG 10/03/2016   Overview:  2006  Hypertensive heart disease with heart failure ([place]) 07/28/2015   Hyponatremia with decreased serum osmolality 07/31/2017   Mitral regurgitation 02/10/2018   moderate   Type 2 diabetes mellitus (HCC)     Past Surgical History:  Procedure Laterality Date   CAROTID ENDARTERECTOMY     CATARACT EXTRACTION     CORONARY ARTERY BYPASS GRAFT      Current Medications: Current Meds  Medication Sig   apixaban (ELIQUIS) 2.5 MG TABS tablet Take 1 tablet (2.5 mg total) by mouth 2 (two) times daily.    bumetanide (BUMEX) 1 MG tablet Take 2 mg by mouth 2 (two) times daily.   famotidine (PEPCID) 20 MG tablet Take 1 tablet (20 mg total) by mouth daily.   levothyroxine (SYNTHROID) 75 MCG tablet Take 75 mcg by mouth daily.    losartan (COZAAR) 50 MG tablet Take 50 mg by mouth daily.   metoprolol succinate (TOPROL-XL) 50 MG 24 hr tablet Take 50 mg by mouth daily.   Multiple Vitamin (MULTIVITAMIN) capsule Take 1 capsule by mouth daily.    nitroGLYCERIN (NITROSTAT) 0.4 MG SL tablet Place 1 tablet (0.4 mg total) under the tongue every 5 (five) minutes as needed for chest pain.   potassium chloride SA (K-DUR,KLOR-CON) 20 MEQ tablet Take 20 mEq by mouth daily.   simvastatin (ZOCOR) 10 MG tablet Take 10 mg by mouth daily.   torsemide (DEMADEX) 20 MG tablet Take 1 tablet (20 mg total) by mouth 3 (three) times daily.   Vitamin D, Ergocalciferol, (DRISDOL) 50000 units CAPS capsule Take 50,000 Units by mouth every 14 (fourteen) days.     Allergies:   Nabumetone and Sulfa antibiotics   Social History   Socioeconomic History   Marital status: Married    Spouse name: Not on file   Number of children: Not on file   Years of education: Not on file   Highest education level: Not on file  Occupational History   Not on file  Tobacco Use   Smoking status: Never   Smokeless tobacco: Never  Vaping Use   Vaping Use: Never used  Substance and Sexual Activity   Alcohol use: Not Currently   Drug use: Never   Sexual activity: Not on file  Other Topics Concern   Not on file  Social History Narrative   Not on file   Social Determinants of Health   Financial Resource Strain: Not on file  Food Insecurity: Not on file  Transportation Needs: Not on file  Physical Activity: Not on file  Stress: Not on file  Social Connections: Not on file     Family History: The patient's family history includes Cancer in her sister; Heart disease in her brother and mother. ROS:   Please see the history of present  illness.    All other systems reviewed and are negative.  EKGs/Labs/Other Studies Reviewed:    The following studies were reviewed today:   Recent Labs: 05/28/2020: ALT 20 05/09/2021: BUN 20; Creatinine, Ser 1.07; NT-Pro BNP 1,224; Potassium 4.0; Sodium 141  Recent Lipid Panel    Component Value Date/Time   CHOL 139 05/28/2020 0835   TRIG 155 (H) 05/28/2020 0835   HDL 45 05/28/2020 0835   CHOLHDL 3.1 05/28/2020 0835   LDLCALC 67 05/28/2020 0835    Physical Exam:    VS:  BP 140/60 (BP Location: Left Arm, Patient Position: Sitting, Cuff Size: Normal)   Pulse 80   Ht 5\' 1"  (1.549 m)   Wt 136 lb 9.6 oz (  62 kg)   SpO2 99%   BMI 25.81 kg/m     Wt Readings from Last 3 Encounters:  05/13/21 136 lb 9.6 oz (62 kg)  05/06/21 137 lb (62.1 kg)  03/29/21 135 lb 12.8 oz (61.6 kg)     GEN: Frail well nourished, well developed in no acute distress HEENT: Normal NECK: No JVD; No carotid bruits LYMPHATICS: No lymphadenopathy CARDIAC: Irregular variable first heart soundno murmurs, rubs, gallops RESPIRATORY:  Clear to auscultation without rales, wheezing or rhonchi  ABDOMEN: Soft, non-tender, non-distended MUSCULOSKELETAL: She has 1-2+ bilateral lower extremity pitting edema above the sock edema; No deformity  SKIN: Warm and dry NEUROLOGIC:  Alert and oriented x 3 PSYCHIATRIC:  Normal affect    Signed, Angel More, MD  05/13/2021 1:06 PM    [facility]

## 2021-05-14 LAB — BASIC METABOLIC PANEL
BUN/Creatinine Ratio: 25 (ref 12–28)
BUN: 22 mg/dL (ref 8–27)
CO2: 28 mmol/L (ref 20–29)
Calcium: 9.8 mg/dL (ref 8.7–10.3)
Chloride: 98 mmol/L (ref 96–106)
Creatinine, Ser: 0.89 mg/dL (ref 0.57–1.00)
Glucose: 128 mg/dL — ABNORMAL HIGH (ref 65–99)
Potassium: 3.9 mmol/L (ref 3.5–5.2)
Sodium: 140 mmol/L (ref 134–144)
eGFR: 64 mL/min/{1.73_m2} (ref >59)

## 2021-05-14 LAB — CBC WITH DIFFERENTIAL/PLATELET
Basophils Absolute: 0 10*3/uL (ref 0.0–0.2)
Basos: 1 %
EOS (ABSOLUTE): 0.3 10*3/uL (ref 0.0–0.4)
Eos: 4 %
Hematocrit: 35.2 % (ref 34.0–46.6)
Hemoglobin: 12 g/dL (ref 11.1–15.9)
Immature Grans (Abs): 0 10*3/uL (ref 0.0–0.1)
Immature Granulocytes: 0 %
Lymphocytes Absolute: 1.8 10*3/uL (ref 0.7–3.1)
Lymphs: 27 %
MCH: 30.3 pg (ref 26.6–33.0)
MCHC: 34.1 g/dL (ref 31.5–35.7)
MCV: 89 fL (ref 79–97)
Monocytes Absolute: 0.4 10*3/uL (ref 0.1–0.9)
Monocytes: 7 %
Neutrophils Absolute: 4 10*3/uL (ref 1.4–7.0)
Neutrophils: 61 %
Platelets: 163 10*3/uL (ref 150–450)
RBC: 3.96 x10E6/uL (ref 3.77–5.28)
RDW: 12.9 % (ref 11.7–15.4)
WBC: 6.5 10*3/uL (ref 3.4–10.8)

## 2021-05-14 LAB — PRO B NATRIURETIC PEPTIDE: NT-Pro BNP: 1369 pg/mL — ABNORMAL HIGH (ref 0–738)

## 2021-05-17 ENCOUNTER — Telehealth: Payer: Self-pay

## 2021-05-17 NOTE — Telephone Encounter (Signed)
-----   Message from Richardo Priest, MD sent at 05/16/2021  7:59 PM EDT ----- Regarding: FW: Good result no changes in treatment ----- Message ----- From: Interface, [facility] Lab Results In Sent: 05/16/2021   4:48 PM EDT To: Richardo Priest, MD

## 2021-05-17 NOTE — Telephone Encounter (Signed)
Spoke with patient regarding results and recommendation.  Patient verbalizes understanding and is agreeable to plan of care. Advised patient to call back with any issues or concerns.  

## 2021-05-25 ENCOUNTER — Other Ambulatory Visit: Payer: Self-pay

## 2021-05-25 ENCOUNTER — Ambulatory Visit (INDEPENDENT_AMBULATORY_CARE_PROVIDER_SITE_OTHER): Payer: Medicare Other | Admitting: Cardiology

## 2021-05-25 ENCOUNTER — Ambulatory Visit (INDEPENDENT_AMBULATORY_CARE_PROVIDER_SITE_OTHER): Payer: Medicare Other

## 2021-05-25 ENCOUNTER — Encounter: Payer: Self-pay | Admitting: Cardiology

## 2021-05-25 VITALS — BP 166/64 | HR 90 | Ht 61.0 in | Wt 135.5 lb

## 2021-05-25 DIAGNOSIS — I5032 Chronic diastolic (congestive) heart failure: Secondary | ICD-10-CM

## 2021-05-25 DIAGNOSIS — I482 Chronic atrial fibrillation, unspecified: Secondary | ICD-10-CM

## 2021-05-25 DIAGNOSIS — I2510 Atherosclerotic heart disease of native coronary artery without angina pectoris: Secondary | ICD-10-CM

## 2021-05-25 DIAGNOSIS — Z7901 Long term (current) use of anticoagulants: Secondary | ICD-10-CM

## 2021-05-25 DIAGNOSIS — E785 Hyperlipidemia, unspecified: Secondary | ICD-10-CM | POA: Diagnosis not present

## 2021-05-25 DIAGNOSIS — I110 Hypertensive heart disease with heart failure: Secondary | ICD-10-CM

## 2021-05-25 MED ORDER — SACUBITRIL-VALSARTAN 24-26 MG PO TABS: 1.0000 | ORAL_TABLET | Freq: Two times a day (BID) | ORAL | 3 refills | Status: DC

## 2021-05-25 NOTE — Progress Notes (Signed)
Cardiology Office Note:    Date:  05/25/2021   ID:  [place], [place] January 08, 1935, MRN [id number]  PCP:  Ronita Hipps, MD  Cardiologist:  Shirlee More, MD    Referring MD: Ronita Hipps, MD    ASSESSMENT:    1. Chronic diastolic heart failure ([place])   2. Hypertensive heart disease with heart failure ([place])   3. Chronic atrial fibrillation ([place])   4. Chronic anticoagulation    PLAN:    In order of problems listed above:  This is a good opportunity to optimize heart failure treatment her EF now is mildly reduced discontinue losartan initiate Entresto follow-up 1 week check BMP proBNP and continue her current high-dose loop diuretic.  Clinically her tricuspid regurgitation does not appear to be as severe as it is not echocardiogram.  Regardless there is no indication for isolated clipping of the tricuspid valve. Stable atrial fibrillation rate controlled continue her beta-blocker along with reduced dose anticoagulant   Next appointment: She will see me in 4 to 6 weeks   Medication Adjustments/Labs and Tests Ordered: Current medicines are reviewed at length with the patient today.  Concerns regarding medicines are outlined above.  Orders Placed This Encounter  Procedures   Basic metabolic panel   Pro b natriuretic peptide (BNP)    Meds ordered this encounter  Medications   sacubitril-valsartan (ENTRESTO) 24-26 MG    Sig: Take 1 tablet by mouth 2 (two) times daily.    Dispense:  180 tablet    Refill:  3     Chief Complaint  Patient presents with   Follow-up   Congestive Heart Failure     History of Present Illness:   First Aleeah Polidoro is a 86 y.o. female with a hx of CAD history of CABG hypertensive heart disease with chronic diastolic heart failure chronic atrial fibrillation with anticoagulation and dyslipidemia.She underwent left heart catheterization 03/02/2011 and showed normal left ventricular function left thoracic artery graft  to the LAD was patent vein graft to the diagonal was patent vein graft to the distal right coronary artery and PDA was patent and vein graft to distal circumflex patent which also filled the obtuse marginal branch.  Her left main coronary artery was free of disease LAD was occluded right coronary artery was subtotally occluded and left circumflex coronary artery was occluded.  She required no intervention at that time.  She has had multiple office visits with decompensated heart failure recently and was Last seen 05/13/2021.  Compliance with diet, lifestyle and medications: Yes  She does a remarkable job caring for herself, her body weight is stable 135 and half pounds blood pressure use morning 112/60 and heart rate consistently 60 to 70 bpm. She has little or no edema no shortness of breath orthopnea PND chest pain palpitation or syncope. I looked at her echocardiogram her EF looks mildly reduced 45 to 50% and she has severe tricuspid regurgitation mild mitral and mild aortic regurgitation.  Past Medical History:  Diagnosis Date   CAD in native artery    Chronic atrial fibrillation ([place]) 10/03/2016   Chronic diastolic heart failure ([place]) 11/06/2016   Coronary artery disease involving native coronary artery of native heart without angina pectoris 07/28/2015   Diabetes mellitus due to underlying condition with unspecified complications ([place]) 0000000   Dyslipidemia    Essential hypertension    Hx of CABG 10/03/2016   Overview:  2006   Hypertensive heart disease with heart failure ([place]) 07/28/2015  Hyponatremia with decreased serum osmolality 07/31/2017   Mitral regurgitation 02/10/2018   moderate   Type 2 diabetes mellitus (HCC)     Past Surgical History:  Procedure Laterality Date   CAROTID ENDARTERECTOMY     CATARACT EXTRACTION     CORONARY ARTERY BYPASS GRAFT      Current Medications: Current Meds  Medication Sig   sacubitril-valsartan (ENTRESTO) 24-26 MG Take 1 tablet by mouth 2  (two) times daily.     Allergies:   Nabumetone and Sulfa antibiotics   Social History   Socioeconomic History   Marital status: Married    Spouse name: Not on file   Number of children: Not on file   Years of education: Not on file   Highest education level: Not on file  Occupational History   Not on file  Tobacco Use   Smoking status: Never   Smokeless tobacco: Never  Vaping Use   Vaping Use: Never used  Substance and Sexual Activity   Alcohol use: Not Currently   Drug use: Never   Sexual activity: Not on file  Other Topics Concern   Not on file  Social History Narrative   Not on file   Social Determinants of Health   Financial Resource Strain: Not on file  Food Insecurity: Not on file  Transportation Needs: Not on file  Physical Activity: Not on file  Stress: Not on file  Social Connections: Not on file     Family History: The patient's family history includes Cancer in her sister; Heart disease in her brother and mother. ROS:   Please see the history of present illness.    All other systems reviewed and are negative.  EKGs/Labs/Other Studies Reviewed:    The following studies were reviewed today:    Recent Labs: 05/28/2020: ALT 20 05/13/2021: BUN 22; Creatinine, Ser 0.89; Hemoglobin 12.0; NT-Pro BNP 1,369; Platelets 163; Potassium 3.9; Sodium 140  Recent Lipid Panel    Component Value Date/Time   CHOL 139 05/28/2020 0835   TRIG 155 (H) 05/28/2020 0835   HDL 45 05/28/2020 0835   CHOLHDL 3.1 05/28/2020 0835   LDLCALC 67 05/28/2020 0835    Physical Exam:    VS:  BP (!) 166/64 (BP Location: Right Arm, Patient Position: Sitting)   Pulse 90   Ht '5\' 1"'$  (1.549 m)   Wt 135 lb 8 oz (61.5 kg)   SpO2 99%   BMI 25.60 kg/m     Wt Readings from Last 3 Encounters:  05/25/21 135 lb 8 oz (61.5 kg)  05/13/21 136 lb 9.6 oz (62 kg)  05/06/21 137 lb (62.1 kg)     GEN: Appears her age well nourished, well developed in no acute distress HEENT: Normal NECK:  No JVD; No carotid bruits LYMPHATICS: No lymphadenopathy CARDIAC: Irregular rhythm variable first heart sound I cannot auscultate tricuspid regurgitation or aortic regurgitation RRR, no murmurs, rubs, gallops RESPIRATORY:  Clear to auscultation without rales, wheezing or rhonchi  ABDOMEN: Soft, non-tender, non-distended MUSCULOSKELETAL: 1+ bilateral lower extremity pitting edema; No deformity  SKIN: Warm and dry NEUROLOGIC:  Alert and oriented x 3 PSYCHIATRIC:  Normal affect    Signed, Shirlee More, MD  05/25/2021 1:10 PM    [facility]

## 2021-05-25 NOTE — Patient Instructions (Signed)
Medication Instructions:  Your physician has recommended you make the following change in your medication: STOP: Losartan START: Entresto 24/26 mg take one tablet by mouth twice daily.  *If you need a refill on your cardiac medications before your next appointment, please call your pharmacy*   Lab Work: Your physician recommends that you return for lab work in: 1 week BMP, ProBNP If you have labs (blood work) drawn today and your tests are completely normal, you will receive your results only by: [place] (if you have [place]) OR A paper copy in the mail If you have any lab test that is abnormal or we need to change your treatment, we will call you to review the results.   Testing/Procedures: None   Follow-Up: At [facility], you and your health needs are our priority.  As part of our continuing mission to provide you with exceptional heart care, we have created designated Provider Care Teams.  These Care Teams include your primary Cardiologist (physician) and Advanced Practice Providers (APPs -  Physician Assistants and Nurse Practitioners) who all work together to provide you with the care you need, when you need it.  We recommend signing up for the patient portal called "MyChart".  Sign up information is provided on this After Visit Summary.  MyChart is used to connect with patients for Virtual Visits (Telemedicine).  Patients are able to view lab/test results, encounter notes, upcoming appointments, etc.  Non-urgent messages can be sent to your provider as well.   To learn more about what you can do with MyChart, go to [email or web address].    Your next appointment:   6 week(s)  The format for your next appointment:   In Person  Provider:   Shirlee More, MD   Other Instructions

## 2021-05-26 ENCOUNTER — Other Ambulatory Visit: Payer: Self-pay | Admitting: Cardiology

## 2021-05-26 LAB — ECHOCARDIOGRAM COMPLETE
Area-P 1/2: 5.06 cm2
Calc EF: 50 %
S' Lateral: 2.6 cm
Single Plane A2C EF: 50.5 %
Single Plane A4C EF: 48.5 %

## 2021-05-30 ENCOUNTER — Telehealth: Payer: Self-pay | Admitting: Cardiology

## 2021-05-30 ENCOUNTER — Other Ambulatory Visit: Payer: Self-pay | Admitting: Cardiology

## 2021-05-30 MED ORDER — TORSEMIDE 20 MG PO TABS
40.0000 mg | ORAL_TABLET | Freq: Two times a day (BID) | ORAL | 3 refills | Status: DC
Start: 2021-05-30 — End: 2022-05-16

## 2021-05-30 NOTE — Telephone Encounter (Signed)
*  STAT* If patient is at the pharmacy, call can be transferred to refill team.   1. Which medications need to be refilled? (please list name of each medication and dose if known)  torsemide (DEMADEX) 20 MG tablet  2. Which pharmacy/location (including street and city if local pharmacy) is medication to be sent to? [place], [place]  3. Do they need a 30 day or 90 day supply?  90 day supply  Patient states she is completely out of medication.

## 2021-05-30 NOTE — Telephone Encounter (Signed)
Refill sent in per request.  

## 2021-06-01 ENCOUNTER — Other Ambulatory Visit: Payer: Self-pay

## 2021-06-01 DIAGNOSIS — Z7901 Long term (current) use of anticoagulants: Secondary | ICD-10-CM | POA: Diagnosis not present

## 2021-06-01 DIAGNOSIS — I482 Chronic atrial fibrillation, unspecified: Secondary | ICD-10-CM | POA: Diagnosis not present

## 2021-06-01 DIAGNOSIS — I110 Hypertensive heart disease with heart failure: Secondary | ICD-10-CM | POA: Diagnosis not present

## 2021-06-01 DIAGNOSIS — I5032 Chronic diastolic (congestive) heart failure: Secondary | ICD-10-CM

## 2021-06-02 ENCOUNTER — Telehealth: Payer: Self-pay

## 2021-06-02 LAB — BASIC METABOLIC PANEL
BUN/Creatinine Ratio: 21 (ref 12–28)
BUN: 22 mg/dL (ref 8–27)
CO2: 27 mmol/L (ref 20–29)
Calcium: 9.3 mg/dL (ref 8.7–10.3)
Chloride: 98 mmol/L (ref 96–106)
Creatinine, Ser: 1.06 mg/dL — ABNORMAL HIGH (ref 0.57–1.00)
Glucose: 166 mg/dL — ABNORMAL HIGH (ref 65–99)
Potassium: 4 mmol/L (ref 3.5–5.2)
Sodium: 139 mmol/L (ref 134–144)
eGFR: 52 mL/min/{1.73_m2} — ABNORMAL LOW (ref >59)

## 2021-06-02 LAB — PRO B NATRIURETIC PEPTIDE: NT-Pro BNP: 953 pg/mL — ABNORMAL HIGH (ref 0–738)

## 2021-06-02 NOTE — Telephone Encounter (Signed)
-----   Message from Richardo Priest, MD sent at 06/02/2021  3:26 PM EDT ----- Normal or stable result  No changes

## 2021-06-02 NOTE — Telephone Encounter (Signed)
Spoke with patient regarding results and recommendation.  Patient verbalizes understanding and is agreeable to plan of care. Advised patient to call back with any issues or concerns.  

## 2021-06-06 DIAGNOSIS — M79673 Pain in unspecified foot: Secondary | ICD-10-CM | POA: Diagnosis not present

## 2021-06-06 DIAGNOSIS — E119 Type 2 diabetes mellitus without complications: Secondary | ICD-10-CM | POA: Diagnosis not present

## 2021-06-06 DIAGNOSIS — Z79899 Other long term (current) drug therapy: Secondary | ICD-10-CM | POA: Diagnosis not present

## 2021-06-06 DIAGNOSIS — E039 Hypothyroidism, unspecified: Secondary | ICD-10-CM | POA: Diagnosis not present

## 2021-06-06 DIAGNOSIS — Z6826 Body mass index (BMI) 26.0-26.9, adult: Secondary | ICD-10-CM | POA: Diagnosis not present

## 2021-06-14 DIAGNOSIS — Z6825 Body mass index (BMI) 25.0-25.9, adult: Secondary | ICD-10-CM | POA: Diagnosis not present

## 2021-06-14 DIAGNOSIS — E119 Type 2 diabetes mellitus without complications: Secondary | ICD-10-CM | POA: Diagnosis not present

## 2021-07-12 NOTE — Progress Notes (Signed)
Cardiology Office Note:    Date:  07/13/2021   ID:  [place], [place] [birth year], MRN [id number]  PCP:  Angel Hipps, MD  Cardiologist:  Angel More, MD    Referring MD: Angel Hipps, MD    ASSESSMENT:    1. Chronic diastolic heart failure ([name])   2. Hypertensive heart disease with heart failure ([name])   3. Chronic atrial fibrillation ([name])   4. Chronic anticoagulation   5. Coronary artery disease involving native coronary artery of native heart without angina pectoris    PLAN:    In order of problems listed above:  Improved continue current medical regimen at this time I would not uptitrate Entresto continue her current loop diuretic and recheck renal function on high-dose torsemide and proBNP level Stable atrial fibrillation rate controlled with beta-blocker continue reduced dose anticoagulant with age body mass Stable CAD having no angina continue medical treatment including statin   Next appointment: 3 months   Medication Adjustments/Labs and Tests Ordered: Current medicines are reviewed at length with the patient today.  Concerns regarding medicines are outlined above.  No orders of the defined types were placed in this encounter.  No orders of the defined types were placed in this encounter.   Follow-up for heart failure initiated on Entresto   History of Present Illness:    Angel Gentry is a 86 y.o. female with a hx of CAD history of CABG hypertensive heart disease with chronic diastolic heart failure chronic atrial fibrillation with anticoagulation and dyslipidemia.She underwent left heart catheterization 03/02/2011 and showed normal left ventricular function left thoracic artery graft to the LAD was patent vein graft to the diagonal was patent vein graft to the distal right coronary artery and PDA was patent and vein graft to distal circumflex patent which also filled the obtuse marginal branch.  Her left main coronary artery was  free of disease LAD was occluded right coronary artery was subtotally occluded and left circumflex coronary artery was occluded.  She required no intervention at that time.  She has had multiple office visits with decompensated heart failure recently and was Last seen 05/25/2021  Compliance with diet, lifestyle and medications: Yes   She is improved she has had a small drop in her weight at home  and has plateaued at 133.5 pounds No longer has orthopnea PND edema shortness of breath or chest pain. She had gout great toe improved with colchicine daily and allopurinol. On heart rate runs 90-100 and blood pressure frequently [id number] systolic Past Medical History:  Diagnosis Date   CAD in native artery    Chronic atrial fibrillation ([place]) 10/03/2016   Chronic diastolic heart failure ([place]) 11/06/2016   Coronary artery disease involving native coronary artery of native heart without angina pectoris 07/28/2015   Diabetes mellitus due to underlying condition with unspecified complications ([place]) 0000000   Dyslipidemia    Essential hypertension    Hx of CABG 10/03/2016   Overview:  2006   Hypertensive heart disease with heart failure ([place]) 07/28/2015   Hyponatremia with decreased serum osmolality 07/31/2017   Mitral regurgitation 02/10/2018   moderate   Type 2 diabetes mellitus (HCC)     Past Surgical History:  Procedure Laterality Date   CAROTID ENDARTERECTOMY     CATARACT EXTRACTION     CORONARY ARTERY BYPASS GRAFT      Current Medications: Current Meds  Medication Sig   allopurinol (ZYLOPRIM) 100 MG tablet Take 100 mg by mouth daily.  apixaban (ELIQUIS) 2.5 MG TABS tablet Take 1 tablet (2.5 mg total) by mouth 2 (two) times daily.   famotidine (PEPCID) 20 MG tablet Take 1 tablet (20 mg total) by mouth daily.   levothyroxine (SYNTHROID) 75 MCG tablet Take 75 mcg by mouth daily.    metoprolol succinate (TOPROL-XL) 50 MG 24 hr tablet Take 50 mg by mouth daily.   Multiple Vitamin  (MULTIVITAMIN) capsule Take 1 capsule by mouth daily.    nitroGLYCERIN (NITROSTAT) 0.4 MG SL tablet Place 1 tablet (0.4 mg total) under the tongue every 5 (five) minutes as needed for chest pain.   potassium chloride SA (K-DUR,KLOR-CON) 20 MEQ tablet Take 20 mEq by mouth daily.   sacubitril-valsartan (ENTRESTO) 24-26 MG Take 1 tablet by mouth 2 (two) times daily.   simvastatin (ZOCOR) 10 MG tablet Take 10 mg by mouth daily.   torsemide (DEMADEX) 20 MG tablet Take 2 tablets (40 mg total) by mouth 2 (two) times daily.   Vitamin D, Ergocalciferol, (DRISDOL) 50000 units CAPS capsule Take 50,000 Units by mouth every 14 (fourteen) days.     Allergies:   Nabumetone and Sulfa antibiotics   Social History   Socioeconomic History   Marital status: Married    Spouse name: Not on file   Number of children: Not on file   Years of education: Not on file   Highest education level: Not on file  Occupational History   Not on file  Tobacco Use   Smoking status: Never   Smokeless tobacco: Never  Vaping Use   Vaping Use: Never used  Substance and Sexual Activity   Alcohol use: Not Currently   Drug use: Never   Sexual activity: Not on file  Other Topics Concern   Not on file  Social History Narrative   Not on file   Social Determinants of Health   Financial Resource Strain: Not on file  Food Insecurity: Not on file  Transportation Needs: Not on file  Physical Activity: Not on file  Stress: Not on file  Social Connections: Not on file     Family History: The patient's family history includes Cancer in her sister; Heart disease in her brother and mother. ROS:   Please see the history of present illness.    All other systems reviewed and are negative.  EKGs/Labs/Other Studies Reviewed:    The following studies were reviewed today:    Recent Labs: 05/13/2021: Hemoglobin 12.0; Platelets 163 06/01/2021: BUN 22; Creatinine, Ser 1.06; NT-Pro BNP 953; Potassium 4.0; Sodium 139  Recent  Lipid Panel    Component Value Date/Time   CHOL 139 05/28/2020 0835   TRIG 155 (H) 05/28/2020 0835   HDL 45 05/28/2020 0835   CHOLHDL 3.1 05/28/2020 0835   LDLCALC 67 05/28/2020 0835    Physical Exam:    VS:  BP 138/76 (BP Location: Left Arm, Patient Position: Sitting, Cuff Size: Normal)   Pulse 90   Ht '5\' 1"'$  (1.549 m)   Wt 135 lb (61.2 kg)   SpO2 99%   BMI 25.51 kg/m     Wt Readings from Last 3 Encounters:  07/13/21 135 lb (61.2 kg)  05/25/21 135 lb 8 oz (61.5 kg)  05/13/21 136 lb 9.6 oz (62 kg)     GEN: She is beginning to appear frail well nourished, well developed in no acute distress HEENT: Normal NECK: No JVD; No carotid bruits LYMPHATICS: No lymphadenopathy CARDIAC: Irregular rate and rhythm RRR, no murmurs, rubs, gallops RESPIRATORY:  Clear to  auscultation without rales, wheezing or rhonchi  ABDOMEN: Soft, non-tender, non-distended MUSCULOSKELETAL:  No edema; No deformity  SKIN: Warm and dry NEUROLOGIC:  Alert and oriented x 3 PSYCHIATRIC:  Normal affect    Signed, Angel More, MD  07/13/2021 8:59 AM    [place]

## 2021-07-13 ENCOUNTER — Ambulatory Visit (INDEPENDENT_AMBULATORY_CARE_PROVIDER_SITE_OTHER): Payer: Medicare Other | Admitting: Cardiology

## 2021-07-13 ENCOUNTER — Encounter: Payer: Self-pay | Admitting: Cardiology

## 2021-07-13 ENCOUNTER — Other Ambulatory Visit: Payer: Self-pay

## 2021-07-13 VITALS — BP 138/76 | HR 90 | Ht 61.0 in | Wt 135.0 lb

## 2021-07-13 DIAGNOSIS — I5032 Chronic diastolic (congestive) heart failure: Secondary | ICD-10-CM

## 2021-07-13 DIAGNOSIS — Z7901 Long term (current) use of anticoagulants: Secondary | ICD-10-CM

## 2021-07-13 DIAGNOSIS — I2510 Atherosclerotic heart disease of native coronary artery without angina pectoris: Secondary | ICD-10-CM | POA: Diagnosis not present

## 2021-07-13 DIAGNOSIS — I482 Chronic atrial fibrillation, unspecified: Secondary | ICD-10-CM | POA: Diagnosis not present

## 2021-07-13 DIAGNOSIS — I110 Hypertensive heart disease with heart failure: Secondary | ICD-10-CM

## 2021-07-13 NOTE — Patient Instructions (Signed)
Medication Instructions:  Your physician recommends that you continue on your current medications as directed. Please refer to the Current Medication list given to you today.   *If you need a refill on your cardiac medications before your next appointment, please call your pharmacy*   Lab Work: Your physician recommends that you return for lab work in: TODAY BMP, ProBNP, Uric acid If you have labs (blood work) drawn today and your tests are completely normal, you will receive your results only by: [place] (if you have [place]) OR A paper copy in the mail If you have any lab test that is abnormal or we need to change your treatment, we will call you to review the results.   Testing/Procedures: None   Follow-Up: At [facility], you and your health needs are our priority.  As part of our continuing mission to provide you with exceptional heart care, we have created designated Provider Care Teams.  These Care Teams include your primary Cardiologist (physician) and Advanced Practice Providers (APPs -  Physician Assistants and Nurse Practitioners) who all work together to provide you with the care you need, when you need it.  We recommend signing up for the patient portal called "MyChart".  Sign up information is provided on this After Visit Summary.  MyChart is used to connect with patients for Virtual Visits (Telemedicine).  Patients are able to view lab/test results, encounter notes, upcoming appointments, etc.  Non-urgent messages can be sent to your provider as well.   To learn more about what you can do with MyChart, go to [email or web address].    Your next appointment:   3 month(s)  The format for your next appointment:   In Person  Provider:   Shirlee More, MD   Other Instructions

## 2021-07-14 ENCOUNTER — Telehealth: Payer: Self-pay

## 2021-07-14 LAB — BASIC METABOLIC PANEL
BUN/Creatinine Ratio: 18 (ref 12–28)
BUN: 23 mg/dL (ref 8–27)
CO2: 26 mmol/L (ref 20–29)
Calcium: 9.2 mg/dL (ref 8.7–10.3)
Chloride: 95 mmol/L — ABNORMAL LOW (ref 96–106)
Creatinine, Ser: 1.28 mg/dL — ABNORMAL HIGH (ref 0.57–1.00)
Glucose: 167 mg/dL — ABNORMAL HIGH (ref 65–99)
Potassium: 4 mmol/L (ref 3.5–5.2)
Sodium: 135 mmol/L (ref 134–144)
eGFR: 41 mL/min/{1.73_m2} — ABNORMAL LOW (ref >59)

## 2021-07-14 LAB — URIC ACID: Uric Acid: 8 mg/dL — ABNORMAL HIGH (ref 3.1–7.9)

## 2021-07-14 LAB — PRO B NATRIURETIC PEPTIDE: NT-Pro BNP: 2092 pg/mL — ABNORMAL HIGH (ref 0–738)

## 2021-07-14 NOTE — Telephone Encounter (Signed)
Spoke with patient regarding results and recommendation.  Patient verbalizes understanding and is agreeable to plan of care. Advised patient to call back with any issues or concerns.  

## 2021-07-14 NOTE — Telephone Encounter (Signed)
-----   Message from Richardo Priest, MD sent at 07/14/2021  7:51 AM EDT ----- Normal or stable result  No changes

## 2021-08-02 DIAGNOSIS — Z23 Encounter for immunization: Secondary | ICD-10-CM | POA: Diagnosis not present

## 2021-08-07 ENCOUNTER — Other Ambulatory Visit: Payer: Self-pay | Admitting: Cardiology

## 2021-09-26 NOTE — Progress Notes (Signed)
[facility] [place]  [address] [place],  [place]  [address] [phone]  Clinic Day:  10/03/2021  Referring physician: Ronita Hipps, MD  This document serves as a record of services personally performed by Yobana Culliton Macarthur Critchley, MD. It was created on their behalf by [facility], a trained medical scribe. The creation of this record is based on the scribe's personal observations and the provider's statements to them.  HISTORY OF PRESENT ILLNESS:  The patient is an [age] y.o. female with stage IA (T1a N0 M0) hormone positive breast cancer, status post a lumpectomy in November 2014. This was followed by adjuvant breast radiation, which was completed in February 2015.  The patient also completed 5 years of anastrozole for her adjuvant endocrine therapy.  She comes in today for routine follow up.  Since her last visit, the patient has been doing well.  She continues to deny having any particular changes in her breasts which concern her for disease recurrence.  Her last mammogram in January 2022 continued to show no evidence of disease recurrence.  PHYSICAL EXAM:  Blood pressure (!) 144/76, pulse 80, temperature 97.8 F (36.6 C), resp. rate 14, height 5\' 1"  (1.549 m), weight 131 lb 9.6 oz (59.7 kg), SpO2 97 %. Wt Readings from Last 3 Encounters:  10/03/21 131 lb 9.6 oz (59.7 kg)  07/13/21 135 lb (61.2 kg)  05/25/21 135 lb 8 oz (61.5 kg)   Body mass index is 24.87 kg/m. Performance status (ECOG): 2 Physical Exam Constitutional:      Appearance: Normal appearance.  HENT:     Mouth/Throat:     Pharynx: Oropharynx is clear. No oropharyngeal exudate.  Cardiovascular:     Rate and Rhythm: Normal rate and regular rhythm.     Heart sounds: No murmur heard.   No friction rub. No gallop.  Pulmonary:     Breath sounds: Normal breath sounds.  Chest:  Breasts:    Right: No swelling, bleeding, inverted nipple, mass, nipple discharge or skin change.     Left: No  swelling, bleeding, inverted nipple, mass, nipple discharge or skin change.  Abdominal:     General: Bowel sounds are normal. There is no distension.     Palpations: Abdomen is soft. There is no mass.     Tenderness: There is no abdominal tenderness.  Musculoskeletal:        General: No tenderness.     Cervical back: Normal range of motion and neck supple.     Right lower leg: No edema.     Left lower leg: No edema.  Lymphadenopathy:     Cervical: No cervical adenopathy.     Right cervical: No superficial, deep or posterior cervical adenopathy.    Left cervical: No superficial, deep or posterior cervical adenopathy.     Upper Body:     Right upper body: No supraclavicular or axillary adenopathy.     Left upper body: No supraclavicular or axillary adenopathy.     Lower Body: No right inguinal adenopathy. No left inguinal adenopathy.  Skin:    Coloration: Skin is not jaundiced.     Findings: No lesion or rash.  Neurological:     General: No focal deficit present.     Mental Status: She is alert and oriented to person, place, and time. Mental status is at baseline.  Psychiatric:        Mood and Affect: Mood normal.        Behavior: Behavior normal.  Thought Content: Thought content normal.        Judgment: Judgment normal.    ASSESSMENT & PLAN:  Assessment/Plan:  An 86 y.o. female with stage IA hormone positive breast cancer, status post a lumpectomy in November 2014.  Based upon her clinical breast exam today, the patient remains disease-free.  With respect to her radiographic breast cancer surveillance, she will be scheduled to undergo her annual mammogram next month.  Otherwise, as she is doing well, I will see her back in another year for repeat clinical assessment.  The patient understands all the plans discussed today and is in agreement with them.    I, Rita Ohara, am acting as scribe for Marice Potter, MD    I have reviewed this report as typed by the medical  scribe, and it is complete and accurate.  Khalilah Hoke Macarthur Critchley, MD

## 2021-10-03 ENCOUNTER — Inpatient Hospital Stay: Payer: Medicare Other | Attending: Oncology | Admitting: Oncology

## 2021-10-03 ENCOUNTER — Other Ambulatory Visit: Payer: Self-pay | Admitting: Oncology

## 2021-10-03 ENCOUNTER — Other Ambulatory Visit: Payer: Self-pay

## 2021-10-03 DIAGNOSIS — C50211 Malignant neoplasm of upper-inner quadrant of right female breast: Secondary | ICD-10-CM

## 2021-10-03 DIAGNOSIS — Z1231 Encounter for screening mammogram for malignant neoplasm of breast: Secondary | ICD-10-CM

## 2021-10-03 DIAGNOSIS — C50412 Malignant neoplasm of upper-outer quadrant of left female breast: Secondary | ICD-10-CM | POA: Diagnosis not present

## 2021-10-03 DIAGNOSIS — Z170 Estrogen receptor positive status [ER+]: Secondary | ICD-10-CM | POA: Diagnosis not present

## 2021-10-03 DIAGNOSIS — C50919 Malignant neoplasm of unspecified site of unspecified female breast: Secondary | ICD-10-CM

## 2021-10-03 HISTORY — DX: Malignant neoplasm of unspecified site of unspecified female breast: C50.919

## 2021-10-11 ENCOUNTER — Ambulatory Visit (INDEPENDENT_AMBULATORY_CARE_PROVIDER_SITE_OTHER): Payer: Medicare Other | Admitting: Cardiology

## 2021-10-11 ENCOUNTER — Encounter: Payer: Self-pay | Admitting: Cardiology

## 2021-10-11 ENCOUNTER — Other Ambulatory Visit: Payer: Self-pay

## 2021-10-11 VITALS — BP 144/78 | HR 76 | Ht 60.0 in | Wt 131.6 lb

## 2021-10-11 DIAGNOSIS — Z7901 Long term (current) use of anticoagulants: Secondary | ICD-10-CM | POA: Diagnosis not present

## 2021-10-11 DIAGNOSIS — E785 Hyperlipidemia, unspecified: Secondary | ICD-10-CM | POA: Diagnosis not present

## 2021-10-11 DIAGNOSIS — I482 Chronic atrial fibrillation, unspecified: Secondary | ICD-10-CM | POA: Diagnosis not present

## 2021-10-11 DIAGNOSIS — I110 Hypertensive heart disease with heart failure: Secondary | ICD-10-CM

## 2021-10-11 DIAGNOSIS — I2510 Atherosclerotic heart disease of native coronary artery without angina pectoris: Secondary | ICD-10-CM | POA: Diagnosis not present

## 2021-10-11 DIAGNOSIS — I5032 Chronic diastolic (congestive) heart failure: Secondary | ICD-10-CM

## 2021-10-11 NOTE — Progress Notes (Signed)
Cardiology Office Note:    Date:  10/11/2021   ID:  Angel Gentry, [place] 18-Sep-1935, MRN [id number]  PCP:  Ronita Hipps, MD  Cardiologist:  Shirlee More, MD    Referring MD: Ronita Hipps, MD    ASSESSMENT:    1. Chronic diastolic heart failure ([place])   2. Hypertensive heart disease with heart failure ([place])   3. Chronic atrial fibrillation ([place])   4. Chronic anticoagulation   5. Coronary artery disease involving native coronary artery of native heart without angina pectoris   6. Dyslipidemia    PLAN:    In order of problems listed above:  Her heart failure is compensated she has no edema continue her current loop diuretic I am extremely reluctant to stop Entresto as it has markedly improved the quality of her life, she has urticaria we will continue and asked her to try taking Benadryl for pruritus.  Recheck BMP proBNP today BP at target continue guideline directed therapy beta-blocker Entresto Rate is controlled continue beta-blocker and reduced dose anticoagulant Stable CAD after surgical revascularization on current medical therapy Lipids are ideal continue intermediate intensity statin   Next appointment: 3 months   Medication Adjustments/Labs and Tests Ordered: Current medicines are reviewed at length with the patient today.  Concerns regarding medicines are outlined above.  No orders of the defined types were placed in this encounter.  No orders of the defined types were placed in this encounter.   Chief Complaint  Patient presents with   Follow-up   Congestive Heart Failure   History of Present Illness:    Angel Gentry is a 86 y.o. female with a hx of CAD history of CABG hypertensive heart disease with chronic diastolic heart failure chronic atrial fibrillation with anticoagulation and dyslipidemia.She underwent left heart catheterization 03/02/2011 and showed normal left ventricular function left thoracic artery graft to the LAD  was patent vein graft to the diagonal was patent vein graft to the distal right coronary artery and PDA was patent and vein graft to distal circumflex patent which also filled the obtuse marginal branch.  Her left main coronary artery was free of disease LAD was occluded right coronary artery was subtotally occluded and left circumflex coronary artery was occluded.  She required no intervention at that time  last seen 07/13/2021.  Compliance with diet, lifestyle and medications: Yes  Overall she is doing well weight is stable at home no edema orthopnea chest pain palpitation or syncope. Unfortunately she is having pruritus around her mouth no rash no urticaria but has not to stop Entresto but I told her she sees evidence of hives or urticaria to stop the drug and let me know and to start taking over-the-counter Claritin once daily. Recent labs 0 06/06/2021 cholesterol 173 LDL 67 HDL 51 A1c 7.2% 07/13/2021 creatinine mildly elevated 1.2 age Past Medical History:  Diagnosis Date   Breast cancer ([place]) 10/03/2021   CAD in native artery    Chronic atrial fibrillation (HCC) 10/03/2016   Chronic diastolic heart failure ([place]) 11/06/2016   Coronary artery disease involving native coronary artery of native heart without angina pectoris 07/28/2015   Diabetes mellitus due to underlying condition with unspecified complications ([place]) 4/81/8563   Dyslipidemia    Essential hypertension    Hx of CABG 10/03/2016   Overview:  2006   Hypertensive heart disease with heart failure ([place]) 07/28/2015   Hyponatremia with decreased serum osmolality 07/31/2017   Mitral regurgitation 02/10/2018   moderate  Type 2 diabetes mellitus (HCC)     Past Surgical History:  Procedure Laterality Date   CAROTID ENDARTERECTOMY     CATARACT EXTRACTION     CORONARY ARTERY BYPASS GRAFT      Current Medications: Current Meds  Medication Sig   allopurinol (ZYLOPRIM) 100 MG tablet Take 100 mg by mouth daily.   apixaban (ELIQUIS) 2.5 MG  TABS tablet Take 1 tablet (2.5 mg total) by mouth 2 (two) times daily.   Famotidine (PEPCID PO) Take 1 tablet by mouth daily.   levothyroxine (SYNTHROID) 75 MCG tablet Take 75 mcg by mouth daily.    metoprolol succinate (TOPROL-XL) 50 MG 24 hr tablet Take 50 mg by mouth daily.   Multiple Vitamin (MULTIVITAMIN) capsule Take 1 capsule by mouth daily.    nitroGLYCERIN (NITROSTAT) 0.4 MG SL tablet Place 1 tablet (0.4 mg total) under the tongue every 5 (five) minutes as needed for chest pain.   potassium chloride SA (K-DUR,KLOR-CON) 20 MEQ tablet Take 20 mEq by mouth daily.   sacubitril-valsartan (ENTRESTO) 24-26 MG Take 1 tablet by mouth 2 (two) times daily.   simvastatin (ZOCOR) 10 MG tablet Take 10 mg by mouth daily.   Vitamin D, Ergocalciferol, (DRISDOL) 50000 units CAPS capsule Take 50,000 Units by mouth every 14 (fourteen) days.     Allergies:   Nabumetone and Sulfa antibiotics   Social History   Socioeconomic History   Marital status: Married    Spouse name: Not on file   Number of children: Not on file   Years of education: Not on file   Highest education level: Not on file  Occupational History   Not on file  Tobacco Use   Smoking status: Never   Smokeless tobacco: Never  Vaping Use   Vaping Use: Never used  Substance and Sexual Activity   Alcohol use: Not Currently   Drug use: Never   Sexual activity: Not on file  Other Topics Concern   Not on file  Social History Narrative   Not on file   Social Determinants of Health   Financial Resource Strain: Not on file  Food Insecurity: Not on file  Transportation Needs: Not on file  Physical Activity: Not on file  Stress: Not on file  Social Connections: Not on file     Family History: The patient's family history includes Cancer in her sister; Heart disease in her brother and mother. ROS:   Please see the history of present illness.    All other systems reviewed and are negative.  EKGs/Labs/Other Studies Reviewed:     The following studies were reviewed today:    Recent Labs: 05/13/2021: Hemoglobin 12.0; Platelets 163 07/13/2021: BUN 23; Creatinine, Ser 1.28; NT-Pro BNP 2,092; Potassium 4.0; Sodium 135  Recent Lipid Panel    Component Value Date/Time   CHOL 139 05/28/2020 0835   TRIG 155 (H) 05/28/2020 0835   HDL 45 05/28/2020 0835   CHOLHDL 3.1 05/28/2020 0835   LDLCALC 67 05/28/2020 0835    Physical Exam:    VS:  BP (!) 144/78    Pulse 76    Ht 5' (1.524 m)    Wt 131 lb 9.6 oz (59.7 kg)    SpO2 98%    BMI 25.70 kg/m     Wt Readings from Last 3 Encounters:  10/11/21 131 lb 9.6 oz (59.7 kg)  10/03/21 131 lb 9.6 oz (59.7 kg)  07/13/21 135 lb (61.2 kg)     GEN: She is beginning to look  frail well nourished, well developed in no acute distress HEENT: Normal NECK: No JVD; No carotid bruits LYMPHATICS: No lymphadenopathy CARDIAC: Irregular rate and rhythm  no murmurs, rubs, gallops RESPIRATORY:  Clear to auscultation without rales, wheezing or rhonchi  ABDOMEN: Soft, non-tender, non-distended MUSCULOSKELETAL:  No edema; No deformity  SKIN: Warm and dry NEUROLOGIC:  Alert and oriented x 3 PSYCHIATRIC:  Normal affect    Signed, Shirlee More, MD  10/11/2021 9:34 AM    [place]

## 2021-10-11 NOTE — Patient Instructions (Signed)
Medication Instructions:  Your physician has recommended you make the following change in your medication:   Take over the counter Claritin once daily.  *If you need a refill on your cardiac medications before your next appointment, please call your pharmacy*   Lab Work: Your physician recommends that you have a BMET and ProBNP today in the office.  If you have labs (blood work) drawn today and your tests are completely normal, you will receive your results only by: [place] (if you have MyChart) OR A paper copy in the mail If you have any lab test that is abnormal or we need to change your treatment, we will call you to review the results.   Testing/Procedures: None ordered   Follow-Up: At [facility], you and your health needs are our priority.  As part of our continuing mission to provide you with exceptional heart care, we have created designated Provider Care Teams.  These Care Teams include your primary Cardiologist (physician) and Advanced Practice Providers (APPs -  Physician Assistants and Nurse Practitioners) who all work together to provide you with the care you need, when you need it.  We recommend signing up for the patient portal called "MyChart".  Sign up information is provided on this After Visit Summary.  MyChart is used to connect with patients for Virtual Visits (Telemedicine).  Patients are able to view lab/test results, encounter notes, upcoming appointments, etc.  Non-urgent messages can be sent to your provider as well.   To learn more about what you can do with MyChart, go to [email or web address].    Your next appointment:   3 month(s)  The format for your next appointment:   In Person  Provider:   Shirlee More, MD   Other Instructions NA

## 2021-10-13 ENCOUNTER — Telehealth: Payer: Self-pay

## 2021-10-13 LAB — BASIC METABOLIC PANEL
BUN/Creatinine Ratio: 26 (ref 12–28)
BUN: 29 mg/dL — ABNORMAL HIGH (ref 8–27)
CO2: 26 mmol/L (ref 20–29)
Calcium: 9.6 mg/dL (ref 8.7–10.3)
Chloride: 98 mmol/L (ref 96–106)
Creatinine, Ser: 1.11 mg/dL — ABNORMAL HIGH (ref 0.57–1.00)
Glucose: 148 mg/dL — ABNORMAL HIGH (ref 70–99)
Potassium: 3.9 mmol/L (ref 3.5–5.2)
Sodium: 140 mmol/L (ref 134–144)
eGFR: 49 mL/min/{1.73_m2} — ABNORMAL LOW (ref >59)

## 2021-10-13 LAB — PRO B NATRIURETIC PEPTIDE: NT-Pro BNP: 1204 pg/mL — ABNORMAL HIGH (ref 0–738)

## 2021-10-13 NOTE — Telephone Encounter (Signed)
Spoke with patient regarding results and recommendation.  Patient verbalizes understanding and is agreeable to plan of care. Advised patient to call back with any issues or concerns.  

## 2021-10-13 NOTE — Telephone Encounter (Signed)
-----   Message from Richardo Priest, MD sent at 10/13/2021  7:36 AM EST ----- Good result no changes

## 2021-11-15 DIAGNOSIS — E039 Hypothyroidism, unspecified: Secondary | ICD-10-CM | POA: Diagnosis not present

## 2021-11-15 DIAGNOSIS — E7800 Pure hypercholesterolemia, unspecified: Secondary | ICD-10-CM | POA: Diagnosis not present

## 2021-11-15 DIAGNOSIS — E119 Type 2 diabetes mellitus without complications: Secondary | ICD-10-CM | POA: Diagnosis not present

## 2021-11-23 DIAGNOSIS — E119 Type 2 diabetes mellitus without complications: Secondary | ICD-10-CM | POA: Diagnosis not present

## 2021-11-23 DIAGNOSIS — E039 Hypothyroidism, unspecified: Secondary | ICD-10-CM | POA: Diagnosis not present

## 2021-11-23 DIAGNOSIS — Z6825 Body mass index (BMI) 25.0-25.9, adult: Secondary | ICD-10-CM | POA: Diagnosis not present

## 2021-11-23 DIAGNOSIS — E7800 Pure hypercholesterolemia, unspecified: Secondary | ICD-10-CM | POA: Diagnosis not present

## 2021-12-02 ENCOUNTER — Encounter: Payer: Self-pay | Admitting: Oncology

## 2021-12-05 DIAGNOSIS — Z1231 Encounter for screening mammogram for malignant neoplasm of breast: Secondary | ICD-10-CM | POA: Diagnosis not present

## 2022-01-17 ENCOUNTER — Encounter: Payer: Self-pay | Admitting: Cardiology

## 2022-01-17 ENCOUNTER — Other Ambulatory Visit: Payer: Self-pay

## 2022-01-17 ENCOUNTER — Ambulatory Visit (INDEPENDENT_AMBULATORY_CARE_PROVIDER_SITE_OTHER): Payer: Medicare Other | Admitting: Cardiology

## 2022-01-17 VITALS — BP 124/70 | HR 88 | Ht 60.0 in | Wt 131.8 lb

## 2022-01-17 DIAGNOSIS — I5032 Chronic diastolic (congestive) heart failure: Secondary | ICD-10-CM

## 2022-01-17 DIAGNOSIS — I482 Chronic atrial fibrillation, unspecified: Secondary | ICD-10-CM | POA: Diagnosis not present

## 2022-01-17 DIAGNOSIS — Z7901 Long term (current) use of anticoagulants: Secondary | ICD-10-CM

## 2022-01-17 DIAGNOSIS — I2510 Atherosclerotic heart disease of native coronary artery without angina pectoris: Secondary | ICD-10-CM

## 2022-01-17 DIAGNOSIS — I110 Hypertensive heart disease with heart failure: Secondary | ICD-10-CM | POA: Diagnosis not present

## 2022-01-17 DIAGNOSIS — E785 Hyperlipidemia, unspecified: Secondary | ICD-10-CM | POA: Diagnosis not present

## 2022-01-17 NOTE — Patient Instructions (Signed)
Medication Instructions:  ?Your physician recommends that you continue on your current medications as directed. Please refer to the Current Medication list given to you today.  ?*If you need a refill on your cardiac medications before your next appointment, please call your pharmacy* ? ? ?Lab Work: ?None ?If you have labs (blood work) drawn today and your tests are completely normal, you will receive your results only by: ?MyChart Message (if you have MyChart) OR ?A paper copy in the mail ?If you have any lab test that is abnormal or we need to change your treatment, we will call you to review the results. ? ? ?Testing/Procedures: ?None ? ? ?Follow-Up: ?At [facility], you and your health needs are our priority.  As part of our continuing mission to provide you with exceptional heart care, we have created designated Provider Care Teams.  These Care Teams include your primary Cardiologist (physician) and Advanced Practice Providers (APPs -  Physician Assistants and Nurse Practitioners) who all work together to provide you with the care you need, when you need it. ? ?We recommend signing up for the patient portal called "MyChart".  Sign up information is provided on this After Visit Summary.  MyChart is used to connect with patients for Virtual Visits (Telemedicine).  Patients are able to view lab/test results, encounter notes, upcoming appointments, etc.  Non-urgent messages can be sent to your provider as well.   ?To learn more about what you can do with MyChart, go to [email or web address].   ? ?Your next appointment:   ?3 month(s) ? ?The format for your next appointment:   ?In Person ? ?Provider:   ?Shirlee More, MD  ? ? ?Other Instructions ?None  ?

## 2022-01-17 NOTE — Progress Notes (Signed)
?Cardiology Office Note:   ? ?Date:  01/17/2022  ? ?ID:  Angel Gentry, [place] 05/04/1935, MRN [id number] ? ?PCP:  Ronita Hipps, MD  ?Cardiologist:  Shirlee More, MD   ? ?Referring MD: Ronita Hipps, MD  ? ? ?ASSESSMENT:   ? ?1. Chronic diastolic heart failure ([place])   ?2. Hypertensive heart disease with heart failure ([place])   ?3. Chronic atrial fibrillation (HCC)   ?4. Chronic anticoagulation   ?5. Coronary artery disease involving native coronary artery of native heart without angina pectoris   ?6. Dyslipidemia   ? ?PLAN:   ? ?In order of problems listed above: ? ?Angel Gentry continues to do well meticulously self manage her heart failure is stable she has no fluid overload she will continue her current loop diuretic. ?Stable BP at target on guideline directed therapy including maximally tolerated Entresto and beta-blocker. ?Rate controlled continue beta-blocker reduced dose anticoagulant with age this ?Stable CAD no angina after remote CABG on current medical treatment ?Continue her statin appropriate for age LDL at target ? ? ?Next appointment: 3 months ? ? ?Medication Adjustments/Labs and Tests Ordered: ?Current medicines are reviewed at length with the patient today.  Concerns regarding medicines are outlined above.  ?No orders of the defined types were placed in this encounter. ? ?No orders of the defined types were placed in this encounter. ? ? ?Chief complaint follow-up for heart failure ? ? ?History of Present Illness:   ? ?Angel Gentry is a 87 y.o. female with a hx of CAD history of CABG hypertensive heart disease with chronic diastolic heart failure chronic atrial fibrillation with anticoagulation and dyslipidemia.She underwent left heart catheterization 03/02/2011 and showed normal left ventricular function left thoracic artery graft to the LAD was patent vein graft to the diagonal was patent vein graft to the distal right coronary artery and PDA was patent and vein graft to distal  circumflex patent which also filled the obtuse marginal branch.  Her left main coronary artery was free of disease LAD was occluded right coronary artery was subtotally occluded and left circumflex coronary artery was occluded.  She required no intervention at that time.  She was   last seen 10/11/2021. ?Compliance with diet, lifestyle and medications: Yes ? ?She continues to meticulously self manage her weights run 131 out of 133 ?Blood pressure 110-120/60-70 ?Her symptoms are stable no edema shortness of breath chest pain palpitation or syncope ?She tolerates her anticoagulant without bleeding ?Tolerates her statin without muscle pain or weakness ? ?Recent labs are reassuring in January 11/15/2021 A1c at target 6.7% hemoglobin normal 12.7 creatinine 1.1 potassium 3.9 LDL 90 cholesterol 180 HDL 47 triglycerides 200. ?Past Medical History:  ?Diagnosis Date  ? Breast cancer ([place]) 10/03/2021  ? CAD in native artery   ? Chronic atrial fibrillation ([place]) 10/03/2016  ? Chronic diastolic heart failure ([place]) 11/06/2016  ? Coronary artery disease involving native coronary artery of native heart without angina pectoris 07/28/2015  ? Diabetes mellitus due to underlying condition with unspecified complications ([place]) 4/94/4967  ? Dyslipidemia   ? Essential hypertension   ? Hx of CABG 10/03/2016  ? Overview:  2006  ? Hypertensive heart disease with heart failure ([place]) 07/28/2015  ? Hyponatremia with decreased serum osmolality 07/31/2017  ? Mitral regurgitation 02/10/2018  ? moderate  ? Type 2 diabetes mellitus ([place])   ? ? ?Past Surgical History:  ?Procedure Laterality Date  ? CAROTID ENDARTERECTOMY    ? CATARACT EXTRACTION    ?  CORONARY ARTERY BYPASS GRAFT    ? ? ?Current Medications: ?Current Meds  ?Medication Sig  ? allopurinol (ZYLOPRIM) 100 MG tablet Take 100 mg by mouth daily.  ? apixaban (ELIQUIS) 2.5 MG TABS tablet Take 1 tablet (2.5 mg total) by mouth 2 (two) times daily.  ? famotidine (PEPCID) 20 MG tablet Take 20 mg by mouth  daily.  ? levothyroxine (SYNTHROID) 75 MCG tablet Take 75 mcg by mouth daily.   ? loratadine (CLARITIN) 10 MG tablet Take 10 mg by mouth daily.  ? metoprolol succinate (TOPROL-XL) 50 MG 24 hr tablet Take 50 mg by mouth daily.  ? Multiple Vitamin (MULTIVITAMIN) capsule Take 1 capsule by mouth daily.   ? nitroGLYCERIN (NITROSTAT) 0.4 MG SL tablet Place 1 tablet (0.4 mg total) under the tongue every 5 (five) minutes as needed for chest pain.  ? potassium chloride SA (K-DUR,KLOR-CON) 20 MEQ tablet Take 20 mEq by mouth daily.  ? sacubitril-valsartan (ENTRESTO) 24-26 MG Take 1 tablet by mouth 2 (two) times daily.  ? simvastatin (ZOCOR) 10 MG tablet Take 10 mg by mouth daily.  ? Vitamin D, Ergocalciferol, (DRISDOL) 50000 units CAPS capsule Take 50,000 Units by mouth every 14 (fourteen) days.  ?  ? ?Allergies:   Nabumetone and Sulfa antibiotics  ? ?Social History  ? ?Socioeconomic History  ? Marital status: Married  ?  Spouse name: Not on file  ? Number of children: Not on file  ? Years of education: Not on file  ? Highest education level: Not on file  ?Occupational History  ? Not on file  ?Tobacco Use  ? Smoking status: Never  ?  Passive exposure: Never  ? Smokeless tobacco: Never  ?Vaping Use  ? Vaping Use: Never used  ?Substance and Sexual Activity  ? Alcohol use: Not Currently  ? Drug use: Never  ? Sexual activity: Not on file  ?Other Topics Concern  ? Not on file  ?Social History Narrative  ? Not on file  ? ?Social Determinants of Health  ? ?Financial Resource Strain: Not on file  ?Food Insecurity: Not on file  ?Transportation Needs: Not on file  ?Physical Activity: Not on file  ?Stress: Not on file  ?Social Connections: Not on file  ?  ? ?Family History: ?The patient's family history includes Cancer in her sister; Heart disease in her brother and mother. ?ROS:   ?Please see the history of present illness.    ?All other systems reviewed and are negative. ? ?EKGs/Labs/Other Studies Reviewed:   ? ?The following studies  were reviewed today: ? ? ?Recent Labs: ?05/13/2021: Hemoglobin 12.0; Platelets 163 ?10/11/2021: BUN 29; Creatinine, Ser 1.11; NT-Pro BNP 1,204; Potassium 3.9; Sodium 140  ?Recent Lipid Panel ?   ?Component Value Date/Time  ? CHOL 139 05/28/2020 0835  ? TRIG 155 (H) 05/28/2020 0835  ? HDL 45 05/28/2020 0835  ? CHOLHDL 3.1 05/28/2020 0835  ? [place] 67 05/28/2020 0835  ? ? ?Physical Exam:   ? ?VS:  BP 124/70 (BP Location: Left Arm)   Pulse 88   Ht 5' (1.524 m)   Wt 131 lb 12.8 oz (59.8 kg)   SpO2 99%   BMI 25.74 kg/m?    ? ?Wt Readings from Last 3 Encounters:  ?01/17/22 131 lb 12.8 oz (59.8 kg)  ?10/11/21 131 lb 9.6 oz (59.7 kg)  ?10/03/21 131 lb 9.6 oz (59.7 kg)  ?  ? ?GEN: Appears her age small statured frail well nourished, well developed in no acute distress ?HEENT:  Normal ?NECK: No JVD; No carotid bruits ?LYMPHATICS: No lymphadenopathy ?CARDIAC: Irregular rate and rhythm soft for steroidsno murmurs, rubs, gallops ?RESPIRATORY:  Clear to auscultation without rales, wheezing or rhonchi  ?ABDOMEN: Soft, non-tender, non-distended ?MUSCULOSKELETAL:  No edema; No deformity  ?SKIN: Warm and dry ?NEUROLOGIC:  Alert and oriented x 3 ?PSYCHIATRIC:  Normal affect  ? ? ?Signed, ?Shirlee More, MD  ?01/17/2022 9:22 AM    ?Angel Gentry  ?

## 2022-01-24 ENCOUNTER — Other Ambulatory Visit: Payer: Self-pay | Admitting: Cardiology

## 2022-04-18 NOTE — Progress Notes (Unsigned)
Cardiology Office Note:    Date:  04/19/2022   ID:  Angel Gentry, Angel Gentry September 03, 1935, MRN [id number]  PCP:  Ronita Hipps, MD  Cardiologist:  Shirlee More, MD    Referring MD: Ronita Hipps, MD    ASSESSMENT:    1. Hypertensive heart disease with heart failure ([facility])   2. Chronic diastolic heart failure ([place])   3. Chronic atrial fibrillation ([place])   4. Chronic anticoagulation   5. Coronary artery disease involving native coronary artery of native heart without angina pectoris   6. Dyslipidemia    PLAN:    In order of problems listed above:  Angel Gentry continues to do well meticulously self manages her heart failure is compensated and she will continue her current therapy including her loop diuretic beta-blocker and Entresto.  I will recheck renal function proBNP today Atrial fibrillation rate controlled asymptomatic continue her beta-blocker and reduced dose anticoagulant with age and body mass Stable CAD following CABG having no angina continue her medical treatment including her anticoagulant beta-blocker nitroglycerin as needed and her statin Continue her statin LDL is been at target and she has arrangements for lipid profile August    Next appointment: 4 months   Medication Adjustments/Labs and Tests Ordered: Current medicines are reviewed at length with the patient today.  Concerns regarding medicines are outlined above.  No orders of the defined types were placed in this encounter.  No orders of the defined types were placed in this encounter.   No chief complaint on file.   History of Present Illness:    Angel Gentry is a 87 y.o. female with a hx of complex heart disease including CAD remote CABG hypertensive heart disease with chronic diastolic heart failure chronic atrial fibrillation with anticoagulation and hyperlipidemia 01/17/2022 last seen .  Her echocardiogram 05/25/2021 showed EF of 55 to 60%.  Compliance with diet, lifestyle and  medications: Yes  She continues to do well weights are stable at home she is up about a pound today she had a biscuit yesterday at lunch I told her weight does not drop back down that she could take an extra tablet of torsemide. She had a brief episode of gout about a month ago that quickly resolved when she took a 3-day course of colchicine We will renew She is not having edema shortness of breath orthopnea chest pain palpitation or syncope No bleeding with her anticoagulant She has no muscle pain or weakness with her statin She was full labs performed in August PCP office She and her husband continue to live independently Past Medical History:  Diagnosis Date   Breast cancer ([place]) 10/03/2021   CAD in native artery    Chronic atrial fibrillation ([place]) 10/03/2016   Chronic diastolic heart failure ([place]) 11/06/2016   Coronary artery disease involving native coronary artery of native heart without angina pectoris 07/28/2015   Diabetes mellitus due to underlying condition with unspecified complications ([place]) 0/53/9767   Dyslipidemia    Essential hypertension    Hx of CABG 10/03/2016   Overview:  2006   Hypertensive heart disease with heart failure ([place]) 07/28/2015   Hyponatremia with decreased serum osmolality 07/31/2017   Mitral regurgitation 02/10/2018   moderate   Type 2 diabetes mellitus (HCC)     Past Surgical History:  Procedure Laterality Date   CAROTID ENDARTERECTOMY     CATARACT EXTRACTION     CORONARY ARTERY BYPASS GRAFT      Current Medications: No outpatient medications have  been marked as taking for the 04/19/22 encounter (Office Visit) with Richardo Priest, MD.     Allergies:   Nabumetone and Sulfa antibiotics   Social History   Socioeconomic History   Marital status: Married    Spouse name: Not on file   Number of children: Not on file   Years of education: Not on file   Highest education level: Not on file  Occupational History   Not on file  Tobacco Use    Smoking status: Never    Passive exposure: Never   Smokeless tobacco: Never  Vaping Use   Vaping Use: Never used  Substance and Sexual Activity   Alcohol use: Not Currently   Drug use: Never   Sexual activity: Not on file  Other Topics Concern   Not on file  Social History Narrative   Not on file   Social Determinants of Health   Financial Resource Strain: Not on file  Food Insecurity: Not on file  Transportation Needs: Not on file  Physical Activity: Not on file  Stress: Not on file  Social Connections: Not on file     Family History: The patient's family history includes Cancer in her sister; Heart disease in her brother and mother. ROS:   Please see the history of present illness.    All other systems reviewed and are negative.  EKGs/Labs/Other Studies Reviewed:    The following studies were reviewed today:  EKG:  EKG ordered today and personally reviewed.  The ekg ordered today demonstrates atrial fibrillation controlled ventricular rate and nonspecific T waves Recent Labs: 05/13/2021: Hemoglobin 12.0; Platelets 163 10/11/2021: BUN 29; Creatinine, Ser 1.11; NT-Pro BNP 1,204; Potassium 3.9; Sodium 140  Recent Lipid Panel    Component Value Date/Time   CHOL 139 05/28/2020 0835   TRIG 155 (H) 05/28/2020 0835   HDL 45 05/28/2020 0835   CHOLHDL 3.1 05/28/2020 0835   LDLCALC 67 05/28/2020 0835    Physical Exam:    VS:  BP 124/72 (BP Location: Right Arm, Patient Position: Sitting, Cuff Size: Normal)   Pulse 87   Ht 5' (1.524 m)   Wt 134 lb 3.2 oz (60.9 kg)   SpO2 97%   BMI 26.21 kg/m     Wt Readings from Last 3 Encounters:  04/19/22 134 lb 3.2 oz (60.9 kg)  01/17/22 131 lb 12.8 oz (59.8 kg)  10/11/21 131 lb 9.6 oz (59.7 kg)     GEN:  Well nourished, well developed in no acute distress HEENT: Normal NECK: No JVD; No carotid bruits LYMPHATICS: No lymphadenopathy CARDIAC: Irregular rate and rhythm RRR, no murmurs, rubs, gallops RESPIRATORY:  Clear to  auscultation without rales, wheezing or rhonchi  ABDOMEN: Soft, non-tender, non-distended MUSCULOSKELETAL:  No edema; No deformity  SKIN: Warm and dry NEUROLOGIC:  Alert and oriented x 3 PSYCHIATRIC:  Normal affect    Signed, Shirlee More, MD  04/19/2022 9:14 AM    [place]

## 2022-04-19 ENCOUNTER — Ambulatory Visit (INDEPENDENT_AMBULATORY_CARE_PROVIDER_SITE_OTHER): Payer: Medicare Other | Admitting: Cardiology

## 2022-04-19 ENCOUNTER — Encounter: Payer: Self-pay | Admitting: Cardiology

## 2022-04-19 VITALS — BP 124/72 | HR 87 | Ht 60.0 in | Wt 134.2 lb

## 2022-04-19 DIAGNOSIS — Z7901 Long term (current) use of anticoagulants: Secondary | ICD-10-CM | POA: Diagnosis not present

## 2022-04-19 DIAGNOSIS — E785 Hyperlipidemia, unspecified: Secondary | ICD-10-CM

## 2022-04-19 DIAGNOSIS — I5032 Chronic diastolic (congestive) heart failure: Secondary | ICD-10-CM | POA: Diagnosis not present

## 2022-04-19 DIAGNOSIS — I2510 Atherosclerotic heart disease of native coronary artery without angina pectoris: Secondary | ICD-10-CM

## 2022-04-19 DIAGNOSIS — I482 Chronic atrial fibrillation, unspecified: Secondary | ICD-10-CM | POA: Diagnosis not present

## 2022-04-19 DIAGNOSIS — I110 Hypertensive heart disease with heart failure: Secondary | ICD-10-CM | POA: Diagnosis not present

## 2022-04-19 MED ORDER — COLCHICINE 0.6 MG PO TABS
0.6000 mg | ORAL_TABLET | Freq: Every day | ORAL | 1 refills | Status: DC
Start: 2022-04-19 — End: 2022-06-13

## 2022-04-19 MED ORDER — ALLOPURINOL 100 MG PO TABS: 100.0000 mg | ORAL_TABLET | Freq: Every day | ORAL | 3 refills | Status: DC

## 2022-04-19 NOTE — Patient Instructions (Signed)
Medication Instructions:  Your physician has recommended you make the following change in your medication:   START: [place]icine 0.6 mg daily for three days as needed for gout flare up  *If you need a refill on your cardiac medications before your next appointment, please call your pharmacy*   Lab Work: Your physician recommends that you return for lab work in:   Labs today: BMP, Pro BNP  If you have labs (blood work) drawn today and your tests are completely normal, you will receive your results only by: MyChart Message (if you have MyChart) OR A paper copy in the mail If you have any lab test that is abnormal or we need to change your treatment, we will call you to review the results.   Testing/Procedures: None   Follow-Up: At [facility], you and your health needs are our priority.  As part of our continuing mission to provide you with exceptional heart care, we have created designated Provider Care Teams.  These Care Teams include your primary Cardiologist (physician) and Advanced Practice Providers (APPs -  Physician Assistants and Nurse Practitioners) who all work together to provide you with the care you need, when you need it.  We recommend signing up for the patient portal called "MyChart".  Sign up information is provided on this After Visit Summary.  MyChart is used to connect with patients for Virtual Visits (Telemedicine).  Patients are able to view lab/test results, encounter notes, upcoming appointments, etc.  Non-urgent messages can be sent to your provider as well.   To learn more about what you can do with MyChart, go to [email or web address].    Your next appointment:   4 month(s)  The format for your next appointment:   In Person  Provider:   Shirlee More, MD    Other Instructions None  Important Information About Sugar

## 2022-04-20 LAB — PRO B NATRIURETIC PEPTIDE: NT-Pro BNP: 1237 pg/mL — ABNORMAL HIGH (ref 0–738)

## 2022-04-20 LAB — BASIC METABOLIC PANEL
BUN/Creatinine Ratio: 26 (ref 12–28)
BUN: 29 mg/dL — ABNORMAL HIGH (ref 8–27)
CO2: 29 mmol/L (ref 20–29)
Calcium: 9.4 mg/dL (ref 8.7–10.3)
Chloride: 102 mmol/L (ref 96–106)
Creatinine, Ser: 1.1 mg/dL — ABNORMAL HIGH (ref 0.57–1.00)
Glucose: 132 mg/dL — ABNORMAL HIGH (ref 70–99)
Potassium: 4.2 mmol/L (ref 3.5–5.2)
Sodium: 143 mmol/L (ref 134–144)
eGFR: 49 mL/min/{1.73_m2} — ABNORMAL LOW (ref >59)

## 2022-04-21 ENCOUNTER — Telehealth: Payer: Self-pay

## 2022-04-21 NOTE — Telephone Encounter (Signed)
Spoke with Mr. Legan, notified of results

## 2022-04-29 ENCOUNTER — Other Ambulatory Visit: Payer: Self-pay | Admitting: Cardiology

## 2022-05-01 NOTE — Telephone Encounter (Signed)
Rx refill sent to pharmacy. 

## 2022-05-16 ENCOUNTER — Telehealth: Payer: Self-pay | Admitting: Cardiology

## 2022-05-16 ENCOUNTER — Other Ambulatory Visit: Payer: Self-pay

## 2022-05-16 MED ORDER — TORSEMIDE 20 MG PO TABS: 40.0000 mg | ORAL_TABLET | Freq: Two times a day (BID) | ORAL | 3 refills | Status: DC

## 2022-05-16 NOTE — Telephone Encounter (Signed)
Left message for the patient to call back.

## 2022-05-16 NOTE — Telephone Encounter (Signed)
*  STAT* If patient is at the pharmacy, call can be transferred to refill team.   1. Which medications need to be refilled? (please list name of each medication and dose if known) Torsemide  2. Which pharmacy/location (including street and city if local pharmacy) is medication to be sent to?[facility] , [place]  3. Do they need a 30 day or 90 day supply? 90 days- pt is completely out of it

## 2022-05-16 NOTE — Telephone Encounter (Signed)
Called patient and she stated that she needed a refill on her Torsemide prescription because she had run out. Her torsemide was refilled and she had no further questions at this time.

## 2022-05-31 DIAGNOSIS — E119 Type 2 diabetes mellitus without complications: Secondary | ICD-10-CM | POA: Diagnosis not present

## 2022-05-31 DIAGNOSIS — E7800 Pure hypercholesterolemia, unspecified: Secondary | ICD-10-CM | POA: Diagnosis not present

## 2022-05-31 DIAGNOSIS — Z79899 Other long term (current) drug therapy: Secondary | ICD-10-CM | POA: Diagnosis not present

## 2022-05-31 DIAGNOSIS — E039 Hypothyroidism, unspecified: Secondary | ICD-10-CM | POA: Diagnosis not present

## 2022-06-13 ENCOUNTER — Other Ambulatory Visit: Payer: Self-pay | Admitting: Cardiology

## 2022-06-14 DIAGNOSIS — Z6826 Body mass index (BMI) 26.0-26.9, adult: Secondary | ICD-10-CM | POA: Diagnosis not present

## 2022-06-14 DIAGNOSIS — Z1331 Encounter for screening for depression: Secondary | ICD-10-CM | POA: Diagnosis not present

## 2022-06-14 DIAGNOSIS — E119 Type 2 diabetes mellitus without complications: Secondary | ICD-10-CM | POA: Diagnosis not present

## 2022-06-14 DIAGNOSIS — Z0000 Encounter for general adult medical examination without abnormal findings: Secondary | ICD-10-CM | POA: Diagnosis not present

## 2022-07-15 ENCOUNTER — Other Ambulatory Visit: Payer: Self-pay | Admitting: Cardiology

## 2022-08-15 DIAGNOSIS — L814 Other melanin hyperpigmentation: Secondary | ICD-10-CM | POA: Diagnosis not present

## 2022-08-15 DIAGNOSIS — Z23 Encounter for immunization: Secondary | ICD-10-CM | POA: Diagnosis not present

## 2022-08-15 DIAGNOSIS — L821 Other seborrheic keratosis: Secondary | ICD-10-CM | POA: Diagnosis not present

## 2022-08-15 DIAGNOSIS — D485 Neoplasm of uncertain behavior of skin: Secondary | ICD-10-CM | POA: Diagnosis not present

## 2022-08-15 DIAGNOSIS — L578 Other skin changes due to chronic exposure to nonionizing radiation: Secondary | ICD-10-CM | POA: Diagnosis not present

## 2022-08-23 NOTE — Progress Notes (Unsigned)
Cardiology Office Note:    Date:  08/24/2022   ID:  Angel Gentry, [place] [birth year], MRN [id number]  PCP:  Ronita Hipps, MD  Cardiologist:  Shirlee More, MD    Referring MD: Ronita Hipps, MD    ASSESSMENT:    1. Hypertensive heart disease with heart failure ([place])   2. Chronic atrial fibrillation (H[facility])   3. Chronic anticoagulation   4. Coronary artery disease involving native coronary artery of native heart without angina pectoris   5. Dyslipidemia    PLAN:    In order of problems listed above:  Pa continues to do well with heart failure her weights at home does not vary runs in the range of 133 to 134 pounds she has no fluid overload and she will continue her current loop diuretic and Entresto and beta-blocker.  Recheck BMP proBNP today She has rate controlled fibrillation is rate controlled with her beta-blocker and is on reduced dose anticoagulant we will continue both Stable CAD after CABG having no angina on current medical therapy and will continue her beta-blocker statin and anticoagulant LDL at target continue her statin   Next appointment: 4 months   Medication Adjustments/Labs and Tests Ordered: Current medicines are reviewed at length with the patient today.  Concerns regarding medicines are outlined above.  Orders Placed This Encounter  Procedures   Basic metabolic panel   Pro b natriuretic peptide (BNP)   No orders of the defined types were placed in this encounter.   Chief Complaint  Patient presents with   Follow-up   Congestive Heart Failure   Atrial Fibrillation   Coronary Artery Disease    History of Present Illness:    Angel Gentry is a 87 y.o. female with a hx of  complex heart disease including CAD remote CABG hypertensive heart disease with chronic diastolic heart failure chronic atrial fibrillation with anticoagulation and hyperlipidemia  last seen 04/19/2022.Her echocardiogram 05/25/2021 showed EF of 55 to  60%.  Compliance with diet, lifestyle and medications: Yes  Angel Gentry continues to do well from a cardiology perspective her and her husband live independently she is not having edema shortness of breath chest pain palpitation or syncope. She is undecided I encouraged her to get the COVID booster this year Recent labs 05/31/2022 cholesterol 141 LDL 67 A1c 6.7 hemoglobin 11.8 creatinine 1.1 potassium 4.3 She is had no bleeding complication from her reduced dose anticoagulant Has not had recurrent gout   Past Medical History:  Diagnosis Date   Breast cancer ([place]) 10/03/2021   CAD in native artery    Chronic atrial fibrillation ([place]) 10/03/2016   Chronic diastolic heart failure ([place]) 11/06/2016   Coronary artery disease involving native coronary artery of native heart without angina pectoris 07/28/2015   Diabetes mellitus due to underlying condition with unspecified complications ([place]) 6/43/3295   Dyslipidemia    Essential hypertension    Hx of CABG 10/03/2016   Overview:  2006   Hypertensive heart disease with heart failure ([place]) 07/28/2015   Hyponatremia with decreased serum osmolality 07/31/2017   Mitral regurgitation 02/10/2018   moderate   Type 2 diabetes mellitus (H[facility])     Past Surgical History:  Procedure Laterality Date   CAROTID ENDARTERECTOMY     CATARACT EXTRACTION     CORONARY ARTERY BYPASS GRAFT      Current Medications: Current Meds  Medication Sig   allopurinol (ZYLOPRIM) 100 MG tablet Take 1 tablet (100 mg total) by mouth daily.  apixaban (ELIQUIS) 2.5 MG TABS tablet Take 1 tablet (2.5 mg total) by mouth 2 (two) times daily.   colchicine 0.6 MG tablet Take 1 tablet (0.6 mg total) by mouth daily. Please take as needed for three days for gout flare up   famotidine (PEPCID) 20 MG tablet Take 1 tablet (20 mg total) by mouth daily.   levothyroxine (SYNTHROID) 75 MCG tablet Take 75 mcg by mouth daily.    loratadine (CLARITIN) 10 MG tablet Take 10 mg by mouth daily.    metoprolol succinate (TOPROL-XL) 50 MG 24 hr tablet Take 50 mg by mouth daily.   Multiple Vitamin (MULTIVITAMIN) capsule Take 1 capsule by mouth daily.    nitroGLYCERIN (NITROSTAT) 0.4 MG SL tablet Place 1 tablet (0.4 mg total) under the tongue every 5 (five) minutes as needed for chest pain.   potassium chloride SA (K-DUR,KLOR-CON) 20 MEQ tablet Take 20 mEq by mouth daily.   sacubitril-valsartan (ENTRESTO) 24-26 MG Take 1 tablet by mouth 2 (two) times daily.   simvastatin (ZOCOR) 10 MG tablet Take 10 mg by mouth daily.   torsemide (DEMADEX) 20 MG tablet Take 2 tablets (40 mg total) by mouth 2 (two) times daily.   Vitamin D, Ergocalciferol, (DRISDOL) 50000 units CAPS capsule Take 50,000 Units by mouth every 14 (fourteen) days.     Allergies:   Nabumetone and Sulfa antibiotics   Social History   Socioeconomic History   Marital status: Married    Spouse name: Not on file   Number of children: Not on file   Years of education: Not on file   Highest education level: Not on file  Occupational History   Not on file  Tobacco Use   Smoking status: Never    Passive exposure: Never   Smokeless tobacco: Never  Vaping Use   Vaping Use: Never used  Substance and Sexual Activity   Alcohol use: Not Currently   Drug use: Never   Sexual activity: Not on file  Other Topics Concern   Not on file  Social History Narrative   Not on file   Social Determinants of Health   Financial Resource Strain: Not on file  Food Insecurity: Not on file  Transportation Needs: Not on file  Physical Activity: Not on file  Stress: Not on file  Social Connections: Not on file     Family History: The patient's family history includes Cancer in her sister; Heart disease in her brother and mother. ROS:   Please see the history of present illness.    All other systems reviewed and are negative.  EKGs/Labs/Other Studies Reviewed:    The following studies were reviewed today:    Recent Labs: 04/19/2022:  BUN 29; Creatinine, Ser 1.10; NT-Pro BNP 1,237; Potassium 4.2; Sodium 143  Recent Lipid Panel    Component Value Date/Time   CHOL 139 05/28/2020 0835   TRIG 155 (H) 05/28/2020 0835   HDL 45 05/28/2020 0835   CHOLHDL 3.1 05/28/2020 0835   LDLCALC 67 05/28/2020 0835    Physical Exam:    VS:  BP 129/74 (BP Location: Right Arm, Patient Position: Sitting, Cuff Size: Normal)   Pulse 80   Ht 5' (1.524 m)   Wt 135 lb (61.2 kg)   SpO2 98%   BMI 26.37 kg/m     Wt Readings from Last 3 Encounters:  08/24/22 135 lb (61.2 kg)  04/19/22 134 lb 3.2 oz (60.9 kg)  01/17/22 131 lb 12.8 oz (59.8 kg)     GEN:  Small stature but does not look frail well nourished, well developed in no acute distress HEENT: Normal NECK: No JVD; No carotid bruits LYMPHATICS: No lymphadenopathy CARDIAC: Irregular rate and rhythm  no murmurs, rubs, gallops RESPIRATORY:  Clear to auscultation without rales, wheezing or rhonchi  ABDOMEN: Soft, non-tender, non-distended MUSCULOSKELETAL:  No edema; No deformity  SKIN: Warm and dry NEUROLOGIC:  Alert and oriented x 3 PSYCHIATRIC:  Normal affect    Signed, Shirlee More, MD  08/24/2022 9:38 AM    [place]

## 2022-08-24 ENCOUNTER — Ambulatory Visit: Payer: Medicare Other | Attending: Cardiology | Admitting: Cardiology

## 2022-08-24 ENCOUNTER — Encounter: Payer: Self-pay | Admitting: Cardiology

## 2022-08-24 VITALS — BP 129/74 | HR 80 | Ht 60.0 in | Wt 135.0 lb

## 2022-08-24 DIAGNOSIS — I110 Hypertensive heart disease with heart failure: Secondary | ICD-10-CM | POA: Insufficient documentation

## 2022-08-24 DIAGNOSIS — Z7901 Long term (current) use of anticoagulants: Secondary | ICD-10-CM | POA: Insufficient documentation

## 2022-08-24 DIAGNOSIS — I482 Chronic atrial fibrillation, unspecified: Secondary | ICD-10-CM | POA: Insufficient documentation

## 2022-08-24 DIAGNOSIS — I2510 Atherosclerotic heart disease of native coronary artery without angina pectoris: Secondary | ICD-10-CM | POA: Insufficient documentation

## 2022-08-24 DIAGNOSIS — E785 Hyperlipidemia, unspecified: Secondary | ICD-10-CM | POA: Insufficient documentation

## 2022-08-24 NOTE — Patient Instructions (Signed)
Sign up for MyChart  Medication Instructions:  No medication changes. *If you need a refill on your cardiac medications before your next appointment, please call your pharmacy*   Lab Work: Your physician recommends that you have a BMET and ProBNP today in the office.  If you have labs (blood work) drawn today and your tests are completely normal, you will receive your results only by: [place] (if you have MyChart) OR A paper copy in the mail If you have any lab test that is abnormal or we need to change your treatment, we will call you to review the results.   Testing/Procedures: None ordered   Follow-Up: At [facility], you and your health needs are our priority.  As part of our continuing mission to provide you with exceptional heart care, we have created designated Provider Care Teams.  These Care Teams include your primary Cardiologist (physician) and Advanced Practice Providers (APPs -  Physician Assistants and Nurse Practitioners) who all work together to provide you with the care you need, when you need it.  We recommend signing up for the patient portal called "MyChart".  Sign up information is provided on this After Visit Summary.  MyChart is used to connect with patients for Virtual Visits (Telemedicine).  Patients are able to view lab/test results, encounter notes, upcoming appointments, etc.  Non-urgent messages can be sent to your provider as well.   To learn more about what you can do with MyChart, go to [email or web address].    Your next appointment:   4 month(s)  The format for your next appointment:   In Person  Provider:   Shirlee More, MD   Other Instructions NA

## 2022-08-26 LAB — BASIC METABOLIC PANEL
BUN/Creatinine Ratio: 28 (ref 12–28)
BUN: 29 mg/dL — ABNORMAL HIGH (ref 8–27)
CO2: 28 mmol/L (ref 20–29)
Calcium: 9.7 mg/dL (ref 8.7–10.3)
Chloride: 96 mmol/L (ref 96–106)
Creatinine, Ser: 1.05 mg/dL — ABNORMAL HIGH (ref 0.57–1.00)
Glucose: 120 mg/dL — ABNORMAL HIGH (ref 70–99)
Potassium: 3.6 mmol/L (ref 3.5–5.2)
Sodium: 139 mmol/L (ref 134–144)
eGFR: 52 mL/min/{1.73_m2} — ABNORMAL LOW (ref >59)

## 2022-08-26 LAB — PRO B NATRIURETIC PEPTIDE: NT-Pro BNP: 980 pg/mL — ABNORMAL HIGH (ref 0–738)

## 2022-10-03 ENCOUNTER — Inpatient Hospital Stay: Payer: Medicare Other | Attending: Oncology | Admitting: Oncology

## 2022-10-03 ENCOUNTER — Other Ambulatory Visit (HOSPITAL_BASED_OUTPATIENT_CLINIC_OR_DEPARTMENT_OTHER): Payer: Self-pay | Admitting: Oncology

## 2022-10-03 VITALS — BP 144/78 | HR 75 | Temp 98.3°F | Resp 16 | Ht 60.0 in | Wt 136.0 lb

## 2022-10-03 DIAGNOSIS — C50211 Malignant neoplasm of upper-inner quadrant of right female breast: Secondary | ICD-10-CM

## 2022-10-03 DIAGNOSIS — C50412 Malignant neoplasm of upper-outer quadrant of left female breast: Secondary | ICD-10-CM

## 2022-10-03 DIAGNOSIS — Z1231 Encounter for screening mammogram for malignant neoplasm of breast: Secondary | ICD-10-CM

## 2022-10-03 DIAGNOSIS — Z170 Estrogen receptor positive status [ER+]: Secondary | ICD-10-CM | POA: Diagnosis not present

## 2022-10-03 NOTE — Progress Notes (Signed)
[facility]  [address] [place],  [place]  [address] [phone]  Clinic Day:  10/03/2022  Referring physician: Ronita Hipps, MD  This document serves as a record of services personally performed by Sanja Elizardo Macarthur Critchley, MD. It was created on their behalf by [facility] E, a trained medical scribe. The creation of this record is based on the scribe's personal observations and the provider's statements to them.  HISTORY OF PRESENT ILLNESS:  The patient is an 87 y.o. female with stage IA (T1a N0 M0) hormone positive breast cancer, status post a lumpectomy in November 2014. This was followed by adjuvant breast radiation, which was completed in February 2015.  The patient also completed 5 years of anastrozole for her adjuvant endocrine therapy.  She comes in today for routine follow up.  Since her last visit, the patient has been doing well.  She continues to deny having any particular changes in her breasts which concern her for disease recurrence.  Of note, her annual mammogram in February 2023 continued to show no evidence of disease recurrence.  PHYSICAL EXAM:  Blood pressure (!) 144/78, pulse 75, temperature 98.3 F (36.8 C), temperature source Oral, resp. rate 16, height 5' (1.524 m), weight 136 lb (61.7 kg), SpO2 99 %. Wt Readings from Last 3 Encounters:  10/03/22 136 lb (61.7 kg)  08/24/22 135 lb (61.2 kg)  04/19/22 134 lb 3.2 oz (60.9 kg)   Body mass index is 26.56 kg/m. Performance status (ECOG): 2 Physical Exam Constitutional:      Appearance: Normal appearance.  HENT:     Mouth/Throat:     Pharynx: Oropharynx is clear. No oropharyngeal exudate.  Cardiovascular:     Rate and Rhythm: Normal rate and regular rhythm.     Heart sounds: No murmur heard.    No friction rub. No gallop.  Pulmonary:     Breath sounds: Normal breath sounds.  Chest:  Breasts:    Right: No swelling, bleeding, inverted nipple, mass, nipple discharge or skin  change.     Left: No swelling, bleeding, inverted nipple, mass, nipple discharge or skin change.  Abdominal:     General: Bowel sounds are normal. There is no distension.     Palpations: Abdomen is soft. There is no mass.     Tenderness: There is no abdominal tenderness.  Musculoskeletal:        General: No tenderness.     Cervical back: Normal range of motion and neck supple.     Right lower leg: No edema.     Left lower leg: No edema.  Lymphadenopathy:     Cervical: No cervical adenopathy.     Right cervical: No superficial, deep or posterior cervical adenopathy.    Left cervical: No superficial, deep or posterior cervical adenopathy.     Upper Body:     Right upper body: No supraclavicular or axillary adenopathy.     Left upper body: No supraclavicular or axillary adenopathy.     Lower Body: No right inguinal adenopathy. No left inguinal adenopathy.  Skin:    Coloration: Skin is not jaundiced.     Findings: No lesion or rash.  Neurological:     General: No focal deficit present.     Mental Status: She is alert and oriented to person, place, and time. Mental status is at baseline.  Psychiatric:        Mood and Affect: Mood normal.        Behavior: Behavior normal.  Thought Content: Thought content normal.        Judgment: Judgment normal.     ASSESSMENT & PLAN:  Assessment/Plan:  An 87 y.o. female with stage IA hormone positive breast cancer, status post a lumpectomy in November 2014.  Based upon her clinical breast exam today, the patient remains disease-free.  With respect to her radiographic breast cancer surveillance, she will be scheduled to undergo her annual mammogram next month.  Otherwise, as she is doing well, I will see her back in another year for repeat clinical assessment.  The patient understands all the plans discussed today and is in agreement with them.    I, Rita Ohara, am acting as scribe for Marice Potter, MD    I have reviewed this report as  typed by the medical scribe, and it is complete and accurate.  Myrtle Haller Macarthur Critchley, MD

## 2022-10-03 NOTE — Progress Notes (Signed)
[facility]  [address] [place],  [place]  [address] [phone]  Clinic Day:  10/03/2022  Referring physician: No ref. provider found  HISTORY OF PRESENT ILLNESS:  The patient is an 87 y.o. female with stage IA (T1a N0 M0) hormone positive breast cancer, status post a lumpectomy in November 2014. This was followed by adjuvant breast radiation, which was completed in February 2015.  The patient also completed 5 years of anastrozole for her adjuvant endocrine therapy.  She comes in today for routine follow up.  Since her last visit, the patient has been doing well.  She continues to deny having any particular changes in her breasts which concern her for disease recurrence.  Of note, her annual mammogram in February 2023 continued to show no evidence of disease recurrence.  PHYSICAL EXAM:  There were no vitals taken for this visit. Wt Readings from Last 3 Encounters:  10/03/22 136 lb (61.7 kg)  08/24/22 135 lb (61.2 kg)  04/19/22 134 lb 3.2 oz (60.9 kg)   There is no height or weight on file to calculate BMI. Performance status (ECOG): 2 Physical Exam Constitutional:      Appearance: Normal appearance.  HENT:     Mouth/Throat:     Pharynx: Oropharynx is clear. No oropharyngeal exudate.  Cardiovascular:     Rate and Rhythm: Normal rate and regular rhythm.     Heart sounds: No murmur heard.    No friction rub. No gallop.  Pulmonary:     Breath sounds: Normal breath sounds.  Chest:  Breasts:    Right: No swelling, bleeding, inverted nipple, mass, nipple discharge or skin change.     Left: No swelling, bleeding, inverted nipple, mass, nipple discharge or skin change.  Abdominal:     General: Bowel sounds are normal. There is no distension.     Palpations: Abdomen is soft. There is no mass.     Tenderness: There is no abdominal tenderness.  Musculoskeletal:        General: No tenderness.     Cervical back: Normal range of motion and neck  supple.     Right lower leg: No edema.     Left lower leg: No edema.  Lymphadenopathy:     Cervical: No cervical adenopathy.     Right cervical: No superficial, deep or posterior cervical adenopathy.    Left cervical: No superficial, deep or posterior cervical adenopathy.     Upper Body:     Right upper body: No supraclavicular or axillary adenopathy.     Left upper body: No supraclavicular or axillary adenopathy.     Lower Body: No right inguinal adenopathy. No left inguinal adenopathy.  Skin:    Coloration: Skin is not jaundiced.     Findings: No lesion or rash.  Neurological:     General: No focal deficit present.     Mental Status: She is alert and oriented to person, place, and time. Mental status is at baseline.  Psychiatric:        Mood and Affect: Mood normal.        Behavior: Behavior normal.        Thought Content: Thought content normal.        Judgment: Judgment normal.    ASSESSMENT & PLAN:  Assessment/Plan:  An 87 y.o. female with stage IA hormone positive breast cancer, status post a lumpectomy in November 2014.  Based upon her clinical breast exam today, the patient remains disease-free.  With  respect to her radiographic breast cancer surveillance, she will be scheduled to undergo her annual mammogram in February 2024.  Otherwise, as she is doing well, I will see her back in another year for repeat clinical assessment.  The patient understands all the plans discussed today and is in agreement with them.    Sorcha Rotunno Macarthur Critchley, MD

## 2022-10-27 ENCOUNTER — Other Ambulatory Visit: Payer: Self-pay | Admitting: Cardiology

## 2022-10-27 NOTE — Telephone Encounter (Signed)
Rx refill sent to pharmacy. 

## 2022-11-30 DIAGNOSIS — K625 Hemorrhage of anus and rectum: Secondary | ICD-10-CM | POA: Diagnosis not present

## 2022-12-06 ENCOUNTER — Ambulatory Visit: Payer: Medicare Other | Attending: Cardiology | Admitting: Cardiology

## 2022-12-06 ENCOUNTER — Encounter: Payer: Self-pay | Admitting: Cardiology

## 2022-12-06 VITALS — BP 100/70 | HR 82 | Ht 60.0 in | Wt 135.0 lb

## 2022-12-06 DIAGNOSIS — Z7901 Long term (current) use of anticoagulants: Secondary | ICD-10-CM | POA: Diagnosis not present

## 2022-12-06 DIAGNOSIS — I110 Hypertensive heart disease with heart failure: Secondary | ICD-10-CM | POA: Insufficient documentation

## 2022-12-06 DIAGNOSIS — K625 Hemorrhage of anus and rectum: Secondary | ICD-10-CM | POA: Diagnosis not present

## 2022-12-06 DIAGNOSIS — I482 Chronic atrial fibrillation, unspecified: Secondary | ICD-10-CM | POA: Insufficient documentation

## 2022-12-06 DIAGNOSIS — T148XXA Other injury of unspecified body region, initial encounter: Secondary | ICD-10-CM | POA: Diagnosis not present

## 2022-12-06 NOTE — Patient Instructions (Signed)
Medication Instructions:  Your physician has recommended you make the following change in your medication:   STOP: Eliquis  *If you need a refill on your cardiac medications before your next appointment, please call your pharmacy*   Lab Work: Your physician recommends that you return for lab work in:   Labs today: BMP, CBC  If you have labs (blood work) drawn today and your tests are completely normal, you will receive your results only by: [place] (if you have MyChart) OR A paper copy in the mail If you have any lab test that is abnormal or we need to change your treatment, we will call you to review the results.   Testing/Procedures: None   Follow-Up: At [facility], you and your health needs are our priority.  As part of our continuing mission to provide you with exceptional heart care, we have created designated Provider Care Teams.  These Care Teams include your primary Cardiologist (physician) and Advanced Practice Providers (APPs -  Physician Assistants and Nurse Practitioners) who all work together to provide you with the care you need, when you need it.  We recommend signing up for the patient portal called "MyChart".  Sign up information is provided on this After Visit Summary.  MyChart is used to connect with patients for Virtual Visits (Telemedicine).  Patients are able to view lab/test results, encounter notes, upcoming appointments, etc.  Non-urgent messages can be sent to your provider as well.   To learn more about what you can do with MyChart, go to [email or web address].    Your next appointment:   6 month(s)  Provider:   Shirlee More, MD    Other Instructions None  This visit was accompanied by Truddie Hidden.

## 2022-12-06 NOTE — Progress Notes (Signed)
Cardiology Office Note:    Date:  12/06/2022   ID:  Angel Gentry, [place] Mar 29, 1935, MRN [id number]  PCP:  Ronita Hipps, MD  Cardiologist:  Shirlee More, MD    Referring MD: Ronita Hipps, MD    ASSESSMENT:    1. Rectal bleeding   2. Bruising   3. Hypertensive heart disease with heart failure ([place])   4. Chronic atrial fibrillation (HCC)   5. Chronic anticoagulation    PLAN:    In order of problems listed above:  With her rectal bleeding and bruising she must stop her anticoagulant and remain off until she has GI evaluation and today we will check a CBC for hemoglobin she appears pale as well as a platelet count with her spontaneous bruising. Her heart failure mains well compensated continue her current loop diuretic as well as beta-blocker and Entresto. Controlled rate with atrial fibrillation continue her beta-blocker and stop her anticoagulant until she stabilizes and has GI evaluation.   Next appointment: 6 months   Medication Adjustments/Labs and Tests Ordered: Current medicines are reviewed at length with the patient today.  Concerns regarding medicines are outlined above.  No orders of the defined types were placed in this encounter.  No orders of the defined types were placed in this encounter.   Chief complaint I was worried because of rectal bleeding and now I am developing bruising on my hands abdominal wallIn   History of Present Illness:    Angel Gentry is a 88 y.o. female with a hx of complex heart disease including CAD and remote CABG hypertensive heart disease and chronic diastolic heart failure chronic atrial fibrillation with anticoagulation and hyper lipidemia last seen 08/24/2022.  There is a notation she has worked into my office hours today for rectal bleeding  Compliance with diet, lifestyle and medications: Yes  She was seen in her primary care office last week for rectal bleeding is continued with each bowel  movement and she is continue to take her anticoagulant. Her concern is she has noticed spontaneous bruising on her hands as well as her abdominal wall without trauma She also is feeling weak but fortunately no edema shortness of breath chest pain palpitation or syncope She tells me many years ago she had a similar scenario and was found to have internal hemorrhoids. She has an appointment to see her PCP next week in follow-up but I think we need to stop anticoagulants I will check a CBC regarding anemia as well as platelet count as her bruising seems to be excessive without a good explanation and at her request I will refer to GI Dr. Lyda Jester.  At this time I would not restart her anticoagulant. Past Medical History:  Diagnosis Date   Breast cancer ([place]) 10/03/2021   CAD in native artery    Chronic atrial fibrillation ([place]) 10/03/2016   Chronic diastolic heart failure ([place]) 11/06/2016   Coronary artery disease involving native coronary artery of native heart without angina pectoris 07/28/2015   Diabetes mellitus due to underlying condition with unspecified complications ([place]) 12/13/863   Dyslipidemia    Essential hypertension    Hx of CABG 10/03/2016   Overview:  2006   Hypertensive heart disease with heart failure ([place]) 07/28/2015   Hyponatremia with decreased serum osmolality 07/31/2017   Mitral regurgitation 02/10/2018   moderate   Type 2 diabetes mellitus ([place])     Past Surgical History:  Procedure Laterality Date   CAROTID ENDARTERECTOMY  CATARACT EXTRACTION     CORONARY ARTERY BYPASS GRAFT      Current Medications: Current Meds  Medication Sig   allopurinol (ZYLOPRIM) 100 MG tablet Take 1 tablet (100 mg total) by mouth daily.   apixaban (ELIQUIS) 2.5 MG TABS tablet Take 1 tablet (2.5 mg total) by mouth 2 (two) times daily.   colchicine 0.6 MG tablet Take 1 tablet (0.6 mg total) by mouth daily. Please take as needed for three days for gout flare up   famotidine (PEPCID) 20 MG  tablet Take 1 tablet (20 mg total) by mouth daily.   levothyroxine (SYNTHROID) 75 MCG tablet Take 75 mcg by mouth daily.    loratadine (CLARITIN) 10 MG tablet Take 10 mg by mouth daily.   metoprolol succinate (TOPROL-XL) 50 MG 24 hr tablet Take 50 mg by mouth daily.   Multiple Vitamin (MULTIVITAMIN) capsule Take 1 capsule by mouth daily.    nitroGLYCERIN (NITROSTAT) 0.4 MG SL tablet Place 1 tablet (0.4 mg total) under the tongue every 5 (five) minutes as needed for chest pain.   potassium chloride SA (K-DUR,KLOR-CON) 20 MEQ tablet Take 20 mEq by mouth daily.   sacubitril-valsartan (ENTRESTO) 24-26 MG Take 1 tablet by mouth 2 (two) times daily.   simvastatin (ZOCOR) 10 MG tablet Take 10 mg by mouth daily.   torsemide (DEMADEX) 20 MG tablet Take 2 tablets (40 mg total) by mouth 2 (two) times daily.   Vitamin D, Ergocalciferol, (DRISDOL) 50000 units CAPS capsule Take 50,000 Units by mouth every 14 (fourteen) days.     Allergies:   Nabumetone and Sulfa antibiotics   Social History   Socioeconomic History   Marital status: Married    Spouse name: Not on file   Number of children: Not on file   Years of education: Not on file   Highest education level: Not on file  Occupational History   Not on file  Tobacco Use   Smoking status: Never    Passive exposure: Never   Smokeless tobacco: Never  Vaping Use   Vaping Use: Never used  Substance and Sexual Activity   Alcohol use: Not Currently   Drug use: Never   Sexual activity: Not on file  Other Topics Concern   Not on file  Social History Narrative   Not on file   Social Determinants of Health   Financial Resource Strain: Low Risk  (10/03/2022)   Overall Financial Resource Strain (CARDIA)    Difficulty of Paying Living Expenses: Not hard at all  Food Insecurity: No Food Insecurity (10/03/2022)   Hunger Vital Sign    Worried About Running Out of Food in the Last Year: Never true    [place] in the Last Year: Never true   Transportation Needs: No Transportation Needs (10/03/2022)   PRAPARE - Hydrologist (Medical): No    Lack of Transportation (Non-Medical): No  Physical Activity: Insufficiently Active (10/03/2022)   Exercise Vital Sign    Days of Exercise per Week: 2 days    Minutes of Exercise per Session: 10 min  Stress: No Stress Concern Present (10/03/2022)   [place]    Feeling of Stress : Not at all  Social Connections: Not on file     Family History: The patient's family history includes Cancer in her sister; Heart disease in her brother and mother. ROS:   Please see the history of present illness.  All other systems reviewed and are negative.  EKGs/Labs/Other Studies Reviewed:    The following studies were reviewed today:  08/24/2022 creatinine 1.05 potassium 3.6 last hemoglobin 11.06 June 2022  Recent Labs: 08/24/2022: BUN 29; Creatinine, Ser 1.05; NT-Pro BNP 980; Potassium 3.6; Sodium 139  Recent Lipid Panel    Component Value Date/Time   CHOL 139 05/28/2020 0835   TRIG 155 (H) 05/28/2020 0835   HDL 45 05/28/2020 0835   CHOLHDL 3.1 05/28/2020 0835   LDLCALC 67 05/28/2020 0835    Physical Exam:    VS:  BP 100/70 (BP Location: Right Arm, Patient Position: Sitting, Cuff Size: Normal)   Pulse 82   Ht 5' (1.524 m)   Wt 135 lb (61.2 kg)   SpO2 97%   BMI 26.37 kg/m     Wt Readings from Last 3 Encounters:  12/06/22 135 lb (61.2 kg)  10/03/22 136 lb (61.7 kg)  08/24/22 135 lb (61.2 kg)     GEN: She looks pale well nourished, well developed in no acute distress HEENT: Normal NECK: No JVD; No carotid bruits LYMPHATICS: No lymphadenopathy CARDIAC: Irregular rate and rhythm  RESPIRATORY:  Clear to auscultation without rales, wheezing or rhonchi  ABDOMEN: Soft, non-tender, non-distended MUSCULOSKELETAL:  No edema; No deformity  SKIN: Warm and dry NEUROLOGIC:  Alert and oriented  x 3 PSYCHIATRIC:  Normal affect   Seen Walker Kehr, RN chaperone   Signed, Shirlee More, MD  12/06/2022 1:44 PM    [facility]

## 2022-12-07 ENCOUNTER — Telehealth: Payer: Self-pay | Admitting: Home Health

## 2022-12-07 ENCOUNTER — Other Ambulatory Visit: Payer: Self-pay | Admitting: Home Health

## 2022-12-07 DIAGNOSIS — Z1231 Encounter for screening mammogram for malignant neoplasm of breast: Secondary | ICD-10-CM | POA: Diagnosis not present

## 2022-12-07 DIAGNOSIS — Z7901 Long term (current) use of anticoagulants: Secondary | ICD-10-CM | POA: Diagnosis not present

## 2022-12-07 DIAGNOSIS — K625 Hemorrhage of anus and rectum: Secondary | ICD-10-CM | POA: Diagnosis not present

## 2022-12-07 DIAGNOSIS — D696 Thrombocytopenia, unspecified: Secondary | ICD-10-CM | POA: Diagnosis not present

## 2022-12-07 NOTE — Telephone Encounter (Signed)
[facility]  called, patient CBC was done 12/06/22, PLT was 12. Called the patient, advised patient go to the nearest ER now for repeat CBC confirmation and potential evaluation of her ongoing bleeding. She agreed.

## 2022-12-08 DIAGNOSIS — E119 Type 2 diabetes mellitus without complications: Secondary | ICD-10-CM | POA: Diagnosis not present

## 2022-12-08 DIAGNOSIS — K922 Gastrointestinal hemorrhage, unspecified: Secondary | ICD-10-CM | POA: Diagnosis not present

## 2022-12-08 DIAGNOSIS — I25810 Atherosclerosis of coronary artery bypass graft(s) without angina pectoris: Secondary | ICD-10-CM | POA: Diagnosis not present

## 2022-12-08 DIAGNOSIS — Z881 Allergy status to other antibiotic agents status: Secondary | ICD-10-CM | POA: Diagnosis not present

## 2022-12-08 DIAGNOSIS — I509 Heart failure, unspecified: Secondary | ICD-10-CM | POA: Diagnosis not present

## 2022-12-08 DIAGNOSIS — I2510 Atherosclerotic heart disease of native coronary artery without angina pectoris: Secondary | ICD-10-CM | POA: Diagnosis not present

## 2022-12-08 DIAGNOSIS — Z79899 Other long term (current) drug therapy: Secondary | ICD-10-CM | POA: Diagnosis not present

## 2022-12-08 DIAGNOSIS — Z66 Do not resuscitate: Secondary | ICD-10-CM | POA: Diagnosis not present

## 2022-12-08 DIAGNOSIS — Z951 Presence of aortocoronary bypass graft: Secondary | ICD-10-CM | POA: Diagnosis not present

## 2022-12-08 DIAGNOSIS — D696 Thrombocytopenia, unspecified: Secondary | ICD-10-CM

## 2022-12-08 DIAGNOSIS — I10 Essential (primary) hypertension: Secondary | ICD-10-CM | POA: Diagnosis not present

## 2022-12-08 DIAGNOSIS — D649 Anemia, unspecified: Secondary | ICD-10-CM | POA: Diagnosis not present

## 2022-12-08 DIAGNOSIS — I482 Chronic atrial fibrillation, unspecified: Secondary | ICD-10-CM | POA: Diagnosis not present

## 2022-12-08 DIAGNOSIS — Z791 Long term (current) use of non-steroidal anti-inflammatories (NSAID): Secondary | ICD-10-CM | POA: Diagnosis not present

## 2022-12-08 DIAGNOSIS — N179 Acute kidney failure, unspecified: Secondary | ICD-10-CM | POA: Diagnosis not present

## 2022-12-08 DIAGNOSIS — Z888 Allergy status to other drugs, medicaments and biological substances status: Secondary | ICD-10-CM | POA: Diagnosis not present

## 2022-12-08 DIAGNOSIS — I110 Hypertensive heart disease with heart failure: Secondary | ICD-10-CM | POA: Diagnosis not present

## 2022-12-08 DIAGNOSIS — Z79890 Hormone replacement therapy (postmenopausal): Secondary | ICD-10-CM | POA: Diagnosis not present

## 2022-12-08 DIAGNOSIS — Z7901 Long term (current) use of anticoagulants: Secondary | ICD-10-CM | POA: Diagnosis not present

## 2022-12-08 DIAGNOSIS — D693 Immune thrombocytopenic purpura: Secondary | ICD-10-CM | POA: Diagnosis not present

## 2022-12-08 DIAGNOSIS — K649 Unspecified hemorrhoids: Secondary | ICD-10-CM | POA: Diagnosis not present

## 2022-12-08 HISTORY — DX: Gastrointestinal hemorrhage, unspecified: K92.2

## 2022-12-08 HISTORY — DX: Thrombocytopenia, unspecified: D69.6

## 2022-12-08 LAB — CBC
Hematocrit: 30.7 % — ABNORMAL LOW (ref 34.0–46.6)
Hemoglobin: 10.1 g/dL — ABNORMAL LOW (ref 11.1–15.9)
MCH: 30.6 pg (ref 26.6–33.0)
MCHC: 32.9 g/dL (ref 31.5–35.7)
MCV: 93 fL (ref 79–97)
Platelets: 12 10*3/uL — CL (ref 150–450)
RBC: 3.3 x10E6/uL — ABNORMAL LOW (ref 3.77–5.28)
RDW: 14.8 % (ref 11.7–15.4)
WBC: 6.2 10*3/uL (ref 3.4–10.8)

## 2022-12-08 LAB — BASIC METABOLIC PANEL
BUN/Creatinine Ratio: 31 — ABNORMAL HIGH (ref 12–28)
BUN: 35 mg/dL — ABNORMAL HIGH (ref 8–27)
CO2: 26 mmol/L (ref 20–29)
Calcium: 9.5 mg/dL (ref 8.7–10.3)
Chloride: 100 mmol/L (ref 96–106)
Creatinine, Ser: 1.13 mg/dL — ABNORMAL HIGH (ref 0.57–1.00)
Glucose: 210 mg/dL — ABNORMAL HIGH (ref 70–99)
Potassium: 3.9 mmol/L (ref 3.5–5.2)
Sodium: 141 mmol/L (ref 134–144)
eGFR: 48 mL/min/{1.73_m2} — ABNORMAL LOW (ref >59)

## 2022-12-09 DIAGNOSIS — D696 Thrombocytopenia, unspecified: Secondary | ICD-10-CM | POA: Diagnosis not present

## 2022-12-09 DIAGNOSIS — D693 Immune thrombocytopenic purpura: Secondary | ICD-10-CM | POA: Diagnosis not present

## 2022-12-10 DIAGNOSIS — D696 Thrombocytopenia, unspecified: Secondary | ICD-10-CM | POA: Diagnosis not present

## 2022-12-10 DIAGNOSIS — D693 Immune thrombocytopenic purpura: Secondary | ICD-10-CM | POA: Diagnosis not present

## 2022-12-11 DIAGNOSIS — D696 Thrombocytopenia, unspecified: Secondary | ICD-10-CM | POA: Diagnosis not present

## 2022-12-11 DIAGNOSIS — D693 Immune thrombocytopenic purpura: Secondary | ICD-10-CM | POA: Diagnosis not present

## 2022-12-12 DIAGNOSIS — D693 Immune thrombocytopenic purpura: Secondary | ICD-10-CM | POA: Diagnosis not present

## 2022-12-12 DIAGNOSIS — I482 Chronic atrial fibrillation, unspecified: Secondary | ICD-10-CM | POA: Diagnosis not present

## 2022-12-13 DIAGNOSIS — E119 Type 2 diabetes mellitus without complications: Secondary | ICD-10-CM | POA: Diagnosis not present

## 2022-12-13 DIAGNOSIS — E7800 Pure hypercholesterolemia, unspecified: Secondary | ICD-10-CM | POA: Diagnosis not present

## 2022-12-14 ENCOUNTER — Encounter: Payer: Self-pay | Admitting: Oncology

## 2022-12-14 DIAGNOSIS — D693 Immune thrombocytopenic purpura: Secondary | ICD-10-CM | POA: Diagnosis not present

## 2022-12-14 DIAGNOSIS — Z6826 Body mass index (BMI) 26.0-26.9, adult: Secondary | ICD-10-CM | POA: Diagnosis not present

## 2022-12-14 DIAGNOSIS — E119 Type 2 diabetes mellitus without complications: Secondary | ICD-10-CM | POA: Diagnosis not present

## 2022-12-19 DIAGNOSIS — I4891 Unspecified atrial fibrillation: Secondary | ICD-10-CM | POA: Diagnosis not present

## 2022-12-19 DIAGNOSIS — E119 Type 2 diabetes mellitus without complications: Secondary | ICD-10-CM | POA: Diagnosis not present

## 2022-12-19 DIAGNOSIS — I2510 Atherosclerotic heart disease of native coronary artery without angina pectoris: Secondary | ICD-10-CM | POA: Diagnosis not present

## 2022-12-19 DIAGNOSIS — I10 Essential (primary) hypertension: Secondary | ICD-10-CM | POA: Diagnosis not present

## 2022-12-19 DIAGNOSIS — Z79899 Other long term (current) drug therapy: Secondary | ICD-10-CM | POA: Diagnosis not present

## 2022-12-19 DIAGNOSIS — Z951 Presence of aortocoronary bypass graft: Secondary | ICD-10-CM | POA: Diagnosis not present

## 2022-12-19 DIAGNOSIS — I509 Heart failure, unspecified: Secondary | ICD-10-CM | POA: Diagnosis not present

## 2022-12-19 DIAGNOSIS — D696 Thrombocytopenia, unspecified: Secondary | ICD-10-CM | POA: Diagnosis not present

## 2022-12-19 DIAGNOSIS — D693 Immune thrombocytopenic purpura: Secondary | ICD-10-CM | POA: Diagnosis not present

## 2022-12-19 DIAGNOSIS — I110 Hypertensive heart disease with heart failure: Secondary | ICD-10-CM | POA: Diagnosis not present

## 2022-12-19 DIAGNOSIS — R945 Abnormal results of liver function studies: Secondary | ICD-10-CM | POA: Diagnosis not present

## 2022-12-25 ENCOUNTER — Ambulatory Visit: Payer: Medicare Other | Admitting: Cardiology

## 2022-12-26 DIAGNOSIS — D696 Thrombocytopenia, unspecified: Secondary | ICD-10-CM | POA: Diagnosis not present

## 2023-01-01 DIAGNOSIS — D696 Thrombocytopenia, unspecified: Secondary | ICD-10-CM | POA: Diagnosis not present

## 2023-01-02 DIAGNOSIS — I10 Essential (primary) hypertension: Secondary | ICD-10-CM | POA: Diagnosis not present

## 2023-01-02 DIAGNOSIS — R899 Unspecified abnormal finding in specimens from other organs, systems and tissues: Secondary | ICD-10-CM | POA: Diagnosis not present

## 2023-01-02 DIAGNOSIS — D696 Thrombocytopenia, unspecified: Secondary | ICD-10-CM | POA: Diagnosis not present

## 2023-01-02 DIAGNOSIS — R748 Abnormal levels of other serum enzymes: Secondary | ICD-10-CM | POA: Diagnosis not present

## 2023-01-02 DIAGNOSIS — N1832 Chronic kidney disease, stage 3b: Secondary | ICD-10-CM | POA: Diagnosis not present

## 2023-01-09 DIAGNOSIS — D696 Thrombocytopenia, unspecified: Secondary | ICD-10-CM | POA: Diagnosis not present

## 2023-01-15 DIAGNOSIS — D696 Thrombocytopenia, unspecified: Secondary | ICD-10-CM | POA: Diagnosis not present

## 2023-01-22 DIAGNOSIS — D696 Thrombocytopenia, unspecified: Secondary | ICD-10-CM | POA: Diagnosis not present

## 2023-01-23 DIAGNOSIS — D696 Thrombocytopenia, unspecified: Secondary | ICD-10-CM | POA: Diagnosis not present

## 2023-01-23 DIAGNOSIS — R899 Unspecified abnormal finding in specimens from other organs, systems and tissues: Secondary | ICD-10-CM | POA: Diagnosis not present

## 2023-01-23 DIAGNOSIS — I10 Essential (primary) hypertension: Secondary | ICD-10-CM | POA: Diagnosis not present

## 2023-01-23 DIAGNOSIS — N1832 Chronic kidney disease, stage 3b: Secondary | ICD-10-CM | POA: Diagnosis not present

## 2023-01-23 DIAGNOSIS — R748 Abnormal levels of other serum enzymes: Secondary | ICD-10-CM | POA: Diagnosis not present

## 2023-02-06 DIAGNOSIS — D696 Thrombocytopenia, unspecified: Secondary | ICD-10-CM | POA: Diagnosis not present

## 2023-02-06 DIAGNOSIS — R899 Unspecified abnormal finding in specimens from other organs, systems and tissues: Secondary | ICD-10-CM | POA: Diagnosis not present

## 2023-02-19 DIAGNOSIS — D696 Thrombocytopenia, unspecified: Secondary | ICD-10-CM | POA: Diagnosis not present

## 2023-02-27 ENCOUNTER — Other Ambulatory Visit: Payer: Self-pay

## 2023-02-27 ENCOUNTER — Telehealth: Payer: Self-pay

## 2023-02-27 ENCOUNTER — Encounter: Payer: Self-pay | Admitting: Cardiology

## 2023-02-27 ENCOUNTER — Encounter (HOSPITAL_BASED_OUTPATIENT_CLINIC_OR_DEPARTMENT_OTHER): Payer: Self-pay | Admitting: Pediatrics

## 2023-02-27 ENCOUNTER — Emergency Department (HOSPITAL_BASED_OUTPATIENT_CLINIC_OR_DEPARTMENT_OTHER): Payer: Medicare Other

## 2023-02-27 ENCOUNTER — Emergency Department (HOSPITAL_BASED_OUTPATIENT_CLINIC_OR_DEPARTMENT_OTHER)
Admission: EM | Admit: 2023-02-27 | Discharge: 2023-02-27 | Disposition: A | Discharge status: Home / Self Care | Payer: Medicare Other | Attending: Emergency Medicine | Admitting: Emergency Medicine

## 2023-02-27 DIAGNOSIS — R079 Chest pain, unspecified: Secondary | ICD-10-CM

## 2023-02-27 DIAGNOSIS — Z79899 Other long term (current) drug therapy: Secondary | ICD-10-CM | POA: Insufficient documentation

## 2023-02-27 DIAGNOSIS — R2242 Localized swelling, mass and lump, left lower limb: Secondary | ICD-10-CM | POA: Diagnosis not present

## 2023-02-27 DIAGNOSIS — I110 Hypertensive heart disease with heart failure: Secondary | ICD-10-CM | POA: Insufficient documentation

## 2023-02-27 DIAGNOSIS — R2241 Localized swelling, mass and lump, right lower limb: Secondary | ICD-10-CM | POA: Insufficient documentation

## 2023-02-27 DIAGNOSIS — R0789 Other chest pain: Secondary | ICD-10-CM | POA: Diagnosis not present

## 2023-02-27 DIAGNOSIS — R7989 Other specified abnormal findings of blood chemistry: Secondary | ICD-10-CM | POA: Insufficient documentation

## 2023-02-27 DIAGNOSIS — I5033 Acute on chronic diastolic (congestive) heart failure: Secondary | ICD-10-CM | POA: Diagnosis not present

## 2023-02-27 DIAGNOSIS — R600 Localized edema: Secondary | ICD-10-CM | POA: Diagnosis not present

## 2023-02-27 DIAGNOSIS — M7989 Other specified soft tissue disorders: Secondary | ICD-10-CM

## 2023-02-27 DIAGNOSIS — E119 Type 2 diabetes mellitus without complications: Secondary | ICD-10-CM | POA: Diagnosis not present

## 2023-02-27 DIAGNOSIS — Z951 Presence of aortocoronary bypass graft: Secondary | ICD-10-CM | POA: Diagnosis not present

## 2023-02-27 DIAGNOSIS — R2240 Localized swelling, mass and lump, unspecified lower limb: Secondary | ICD-10-CM | POA: Diagnosis not present

## 2023-02-27 DIAGNOSIS — I2510 Atherosclerotic heart disease of native coronary artery without angina pectoris: Secondary | ICD-10-CM | POA: Diagnosis not present

## 2023-02-27 LAB — CBC
HCT: 40.8 % (ref 36.0–46.0)
Hemoglobin: 13 g/dL (ref 12.0–15.0)
MCH: 29.1 pg (ref 26.0–34.0)
MCHC: 31.9 g/dL (ref 30.0–36.0)
MCV: 91.5 fL (ref 80.0–100.0)
Platelets: 153 10*3/uL (ref 150–400)
RBC: 4.46 MIL/uL (ref 3.87–5.11)
RDW: 13.3 % (ref 11.5–15.5)
WBC: 7.2 10*3/uL (ref 4.0–10.5)
nRBC: 0 % (ref 0.0–0.2)

## 2023-02-27 LAB — BRAIN NATRIURETIC PEPTIDE: B Natriuretic Peptide: 228.6 pg/mL — ABNORMAL HIGH (ref 0.0–100.0)

## 2023-02-27 LAB — TROPONIN I (HIGH SENSITIVITY)
Troponin I (High Sensitivity): 15 ng/L (ref <18)
Troponin I (High Sensitivity): 16 ng/L (ref <18)

## 2023-02-27 LAB — BASIC METABOLIC PANEL
Anion gap: 13 (ref 5–15)
BUN: 23 mg/dL (ref 8–23)
CO2: 27 mmol/L (ref 22–32)
Calcium: 9.1 mg/dL (ref 8.9–10.3)
Chloride: 96 mmol/L — ABNORMAL LOW (ref 98–111)
Creatinine, Ser: 0.97 mg/dL (ref 0.44–1.00)
GFR, Estimated: 57 mL/min — ABNORMAL LOW (ref >60)
Glucose, Bld: 151 mg/dL — ABNORMAL HIGH (ref 70–99)
Potassium: 3.3 mmol/L — ABNORMAL LOW (ref 3.5–5.1)
Sodium: 136 mmol/L (ref 135–145)

## 2023-02-27 LAB — D-DIMER, QUANTITATIVE: D-Dimer, Quant: 0.6 ug/mL-FEU — ABNORMAL HIGH (ref 0.00–0.50)

## 2023-02-27 MED ORDER — FUROSEMIDE 20 MG PO TABS
20.0000 mg | ORAL_TABLET | Freq: Once | ORAL | Status: AC
  Administered 2023-02-27: 20 mg via ORAL
  Filled 2023-02-27: qty 1

## 2023-02-27 MED ORDER — FUROSEMIDE 20 MG PO TABS: 20.0000 mg | ORAL_TABLET | Freq: Every day | ORAL | 0 refills | Status: DC

## 2023-02-27 MED ORDER — POTASSIUM CHLORIDE CRYS ER 20 MEQ PO TBCR
60.0000 meq | EXTENDED_RELEASE_TABLET | Freq: Once | ORAL | Status: AC
  Administered 2023-02-27: 60 meq via ORAL
  Filled 2023-02-27: qty 3

## 2023-02-27 MED ORDER — POTASSIUM CHLORIDE CRYS ER 20 MEQ PO TBCR: 20.0000 meq | EXTENDED_RELEASE_TABLET | Freq: Two times a day (BID) | ORAL | 0 refills | Status: DC

## 2023-02-27 NOTE — ED Notes (Addendum)
Urine specimen in lab 

## 2023-02-27 NOTE — ED Triage Notes (Signed)
C/O chest pain started this morning, stated hx of Afib

## 2023-02-27 NOTE — Discharge Instructions (Addendum)
Return to the ED with any new or worsening signs or symptoms Please begin taking 20 mg of Lasix starting tomorrow.  While you are taking Lasix for the next 3 days do not take torsemide. Please also begin taking potassium that I prescribed.  You will take 20 mEq in the morning and at night for the next 3 days beginning tomorrow on Wednesday You will follow-up with your cardiologist, Dr. Dulce Sellar, on Friday at 8:45 AM at their [facility] location.  You will be seeing their NP. If you need to reschedule this appointment please call [phone]. If no one answers, continue calling, do not leave a voicemail.

## 2023-02-27 NOTE — Telephone Encounter (Signed)
Called patient and she reported having chest tightness and pressure when she breathes. She states that she was recently in [facility] with a GI bleed and low platelets. She also states that while she was admitted to [facility] they stopped her Entresto. Spoke with Dr. Dulce Sellar and he recommended based on her previous hospitalization and the chest tightness and pressure that she go to the ER to be evaluated. I relayed this recommendation to the patient and she stated that she would go to the ER. Patient had no further questions at this time.

## 2023-02-27 NOTE — ED Notes (Signed)
RT ambulated pt on pulse ox. Pt's O2 saturation remained 95-98%.

## 2023-02-27 NOTE — ED Provider Notes (Signed)
[place] [facility] Provider Note   CSN: [id number] Arrival date & time: 02/27/23  1152     History  Chief Complaint  Patient presents with   Chest Pain    Angel Gentry is a 88 y.o. female with medical history of mitral regurgitation, chronic diastolic heart failure, chronic A-fib no longer anticoagulated, history of CABG in 2017, CAD, hypertension, diabetes.  Patient presents to ED for evaluation of chest tightness, shortness of breath and leg swelling.  Patient reports that ever since being discharged in the hospital in February she has had chest tightness, shortness of breath and leg swelling.  Patient was admitted in February secondary to GI bleed.  At this time, the patient was taken off of her anticoagulation by her cardiologist, also taken off of Entresto.  Patient has history of chronic A-fib, patient currently in atrial fibrillation on the monitor in the room.  Patient states that chest tightness is present most days, worse with exertion.  Patient also endorsing shortness of breath that is worse with exertion.  Patient reports compliance on her torsemide, reports that she has had leg swelling every day for the last 3 months.  Patient reports that she came in today because her family was concerned about leg swelling.  The patient denies any fevers, nausea, vomiting, abdominal pain, lightheadedness, dizziness, syncope.   Chest Pain Associated symptoms: shortness of breath   Associated symptoms: no dizziness, no fever and no weakness        Home Medications Prior to Admission medications   Medication Sig Start Date End Date Taking? Authorizing Provider  furosemide (LASIX) 20 MG tablet Take 1 tablet (20 mg total) by mouth daily. 02/27/23  Yes Al Decant, PA-C  potassium chloride SA (KLOR-CON M) 20 MEQ tablet Take 1 tablet (20 mEq total) by mouth 2 (two) times daily. 02/27/23  Yes Al Decant, PA-C  allopurinol  (ZYLOPRIM) 100 MG tablet Take 1 tablet (100 mg total) by mouth daily. 04/19/22   Baldo Daub, MD  colchicine 0.6 MG tablet Take 1 tablet (0.6 mg total) by mouth daily. Please take as needed for three days for gout flare up 06/13/22   Baldo Daub, MD  famotidine (PEPCID) 20 MG tablet Take 1 tablet (20 mg total) by mouth daily. 07/17/22   Baldo Daub, MD  levothyroxine (SYNTHROID) 75 MCG tablet Take 75 mcg by mouth daily.  03/06/17   [provider]  loratadine (CLARITIN) 10 MG tablet Take 10 mg by mouth daily.    [provider]  metoprolol succinate (TOPROL-XL) 50 MG 24 hr tablet Take 50 mg by mouth daily. 03/06/17   [provider]  Multiple Vitamin (MULTIVITAMIN) capsule Take 1 capsule by mouth daily.     [provider]  nitroGLYCERIN (NITROSTAT) 0.4 MG SL tablet Place 1 tablet (0.4 mg total) under the tongue every 5 (five) minutes as needed for chest pain. 05/06/21   Baldo Daub, MD  sacubitril-valsartan (ENTRESTO) 24-26 MG Take 1 tablet by mouth 2 (two) times daily. 10/27/22   Baldo Daub, MD  simvastatin (ZOCOR) 10 MG tablet Take 10 mg by mouth daily. 04/12/17   [provider]  torsemide (DEMADEX) 20 MG tablet Take 2 tablets (40 mg total) by mouth 2 (two) times daily. 05/16/22   Baldo Daub, MD  Vitamin D, Ergocalciferol, (DRISDOL) 50000 units CAPS capsule Take 50,000 Units by mouth every 14 (fourteen) days. 06/11/17   [provider]      Allergies    Nabumetone and Sulfa antibiotics    Review of Systems   Review of Systems  Constitutional:  Negative for fever.  Respiratory:  Positive for shortness of breath.   Cardiovascular:  Positive for chest pain and leg swelling.  Neurological:  Negative for dizziness, syncope, weakness and light-headedness.  All other systems reviewed and are negative.   Physical Exam Updated Vital Signs BP (!) 160/91   Pulse 81   Temp 98.3 F (36.8 C)   Resp 13   Ht 5' (1.524 m)   Wt  61.2 kg   SpO2 95%   BMI 26.37 kg/m  Physical Exam Vitals and nursing note reviewed.  Constitutional:      General: She is not in acute distress.    Appearance: Normal appearance. She is not ill-appearing, toxic-appearing or diaphoretic.  HENT:     Head: Normocephalic and atraumatic.     Nose: Nose normal.     Mouth/Throat:     Mouth: Mucous membranes are moist.     Pharynx: Oropharynx is clear.  Eyes:     Extraocular Movements: Extraocular movements intact.     Conjunctiva/sclera: Conjunctivae normal.     Pupils: Pupils are equal, round, and reactive to light.  Cardiovascular:     Rate and Rhythm: Normal rate and regular rhythm.  Pulmonary:     Effort: Pulmonary effort is normal. No respiratory distress.     Breath sounds: Normal breath sounds. No wheezing.  Abdominal:     General: Abdomen is flat. Bowel sounds are normal.     Palpations: Abdomen is soft.     Tenderness: There is no abdominal tenderness.  Musculoskeletal:     Cervical back: Normal range of motion and neck supple. No tenderness.     Right lower leg: Edema present.     Left lower leg: Edema present.  Skin:    Capillary Refill: Capillary refill takes less than 2 seconds.  Neurological:     Mental Status: She is alert and oriented to person, place, and time.     ED Results / Procedures / Treatments   Labs (all labs ordered are listed, but only abnormal results are displayed) Labs Reviewed  BASIC METABOLIC PANEL - Abnormal; Notable for the following components:      Result Value   Potassium 3.3 (*)    Chloride 96 (*)    Glucose, Bld 151 (*)    GFR, Estimated 57 (*)    All other components within normal limits  BRAIN NATRIURETIC PEPTIDE - Abnormal; Notable for the following components:   B Natriuretic Peptide 228.6 (*)    All other components within normal limits  D-DIMER, QUANTITATIVE - Abnormal; Notable for the following components:   D-Dimer, Quant 0.60 (*)    All other components within normal  limits  CBC  TROPONIN I (HIGH SENSITIVITY)  TROPONIN I (HIGH SENSITIVITY)    EKG EKG Interpretation  Date/Time:  Tuesday February 27 2023 12:03:53 EDT Ventricular Rate:  83 PR Interval:    QRS Duration: 83 QT Interval:  420 QTC Calculation: 494 R Axis:   61 Text Interpretation: Atrial fibrillation Low voltage, precordial leads Borderline repolarization abnormality Borderline prolonged QT interval Since prior ECG< now in atrial fibrillation Confirmed by Alvira Monday ([id number]) on 02/27/2023 1:04:41 PM  Radiology US Venous Img Lower Right (DVT Study)  Result Date: 02/27/2023 CLINICAL DATA:  Right lower extremity edema.  Evaluate for DVT. EXAM: RIGHT LOWER EXTREMITY VENOUS  DOPPLER ULTRASOUND TECHNIQUE: Gray-scale sonography with graded compression, as well as color Doppler and duplex ultrasound were performed to evaluate the lower extremity deep venous systems from the level of the common femoral vein and including the common femoral, femoral, profunda femoral, popliteal and calf veins including the posterior tibial, peroneal and gastrocnemius veins when visible. The superficial great saphenous vein was also interrogated. Spectral Doppler was utilized to evaluate flow at rest and with distal augmentation maneuvers in the common femoral, femoral and popliteal veins. COMPARISON:  Bilateral lower extremity venous Doppler ultrasound-12/26/2013 (negative). FINDINGS: Contralateral Common Femoral Vein: Respiratory phasicity is normal and symmetric with the symptomatic side. No evidence of thrombus. Normal compressibility. Common Femoral Vein: No evidence of thrombus. Normal compressibility, respiratory phasicity and response to augmentation. Saphenofemoral Junction: No evidence of thrombus. Normal compressibility and flow on color Doppler imaging. Profunda Femoral Vein: No evidence of thrombus. Normal compressibility and flow on color Doppler imaging. Femoral Vein: No evidence of thrombus. Normal  compressibility, respiratory phasicity and response to augmentation. Popliteal Vein: No evidence of thrombus. Normal compressibility, respiratory phasicity and response to augmentation. Calf Veins: No evidence of thrombus. Normal compressibility and flow on color Doppler imaging. Superficial Great Saphenous Vein: No evidence of thrombus. Normal compressibility. Other Findings:  None. IMPRESSION: No evidence of DVT within the right lower extremity. Electronically Signed   By: Simonne Come M.D.   On: 02/27/2023 16:04   DG Chest 2 View  Result Date: 02/27/2023 CLINICAL DATA:  Chest pain EXAM: CHEST - 2 VIEW COMPARISON:  None Available. FINDINGS: No pleural effusion. No pneumothorax. No focal airspace opacity. Status post median sternotomy and CABG. Normal cardiac and mediastinal contours. No radiographically apparent displaced rib fractures. Surgical clips in the right upper quadrant. Vertebral body heights are maintained. IMPRESSION: No focal airspace opacity. Electronically Signed   By: Lorenza Cambridge M.D.   On: 02/27/2023 12:44    Procedures Procedures   Medications Ordered in ED Medications  furosemide (LASIX) tablet 20 mg (20 mg Oral Given 02/27/23 1333)  potassium chloride SA (KLOR-CON M) CR tablet 60 mEq (60 mEq Oral Given 02/27/23 1333)    ED Course/ Medical Decision Making/ A&P  Medical Decision Making Amount and/or Complexity of Data Reviewed Labs: ordered. Radiology: ordered.  Risk Prescription drug management.   88 year old female presents to the ED for evaluation of chest pain shortness of breath and leg swelling.  Please see HPI for further details.  On examination the patient is afebrile, not tachycardic, in atrial fibrillation.  Patient lung sounds clear bilaterally, she is not hypoxic, she does not desaturate when she ambulates.  Abdomen soft and compressible.  Neurological examination at baseline.  2+ pitting edema to bilateral lower extremities, no signs of  cellulitis.   CBC without leukocytosis or anemia.  BMP with potassium 3.3, no other electrolyte derangement.  Patient provided 60 mEq oral potassium.  Patient troponin 15 and 16 respectively.  BNP 228.6.  On chart review the patient's proBNP has been much higher than this in the past, most recently 900.  D-dimer elevated 0.60 however age-adjusted D-dimer makes the chance of PE very low.  Patient given 40 mg Lasix here, 60 mEq oral potassium.  The patient is diuresed well here.  The patient was discussed with her cardiologist Dr. Dulce Sellar.  Dr. Dulce Sellar states that they will see her in the office on Friday.  The patient was sent home with 3 days of 20 mg of Lasix, 20 mEq oral potassium which she will take twice  daily.  The patient and the patient family at bedside were advised to return to the ED with any new or worsening signs or symptoms.  They voiced understanding.  All questions answered to their satisfaction.  Stable to discharge home.  Final Clinical Impression(s) / ED Diagnoses Final diagnoses:  Chest pain, unspecified type  Leg swelling    Rx / DC Orders ED Discharge Orders          Ordered    furosemide (LASIX) 20 MG tablet  Daily        02/27/23 1654    potassium chloride SA (KLOR-CON M) 20 MEQ tablet  2 times daily        02/27/23 1654              Clent Ridges 02/27/23 1700    Alvira Monday, MD 02/28/23 1204

## 2023-03-01 NOTE — Progress Notes (Signed)
Cardiology Office Note:    Date:  03/02/2023   ID:  [name], [place] 22-May-1935, MRN [id number]  PCP:  Marylen Ponto, MD   [facility] Providers Cardiologist:  Norman Herrlich, MD     Referring MD: Marylen Ponto, MD  CC: ED follow up    History of Present Illness:    Angel Gentry is a 88 y.o. female with a hx of CAD s/p CABG x 6 2017, chronic diastolic heart failure, hypertension, atrial fibrillation not currently anticoagulated secondary to ITP and GI bleed, DM2, idiopathic thrombocytopenia, hyperlipidemia.  Echo on 05/25/21 EF 55 to 60%, moderately elevated PASP, LA mildly dilated, RA severely dilated, mild MR, mild calcification of the aortic valve, mild AR.  Most recently she was evaluated by Dr. Dulce Sellar on 12/06/22 for GI bleed. She was then admitted to the hospital on 12/08/22 for GI bleed.  Her Eliquis was held until further workup by GI.  She was eventually evaluated by hematology with [facility], and diagnosed with idiopathic thrombocytopenia.  She was evaluated in the ED on 02/27/2023 for chest pain, EKG showed atrial fibrillation.  She apparently has ongoing chest tightness that worsens with exertion, she did not endorse any new chest pain however her family was concerned that her pedal edema appeared to be worse and wanted her to be evaluated.  Ultrasound was negative for DVT. Her potassium was 3.3, GFR 57, BNP 228, D-dimer 0.60, troponin 15> 16.  She was given 40 mg IV Lasix, 60 mEq oral potassium.  She was discharged with instructions to take 20 Lasix twice daily x 3 days and follow-up with her cardiologist.  She presents today accompanied by her husband for follow-up after her ED evaluation.  She was instructed to stop her Demadex, and take Lasix p.o. x 3 days per outlined above.  She has not noticed that this caused any diuresis whatsoever for her.  She continues to have similar symptoms, chest fullness with exertion.  She endorses pedal edema.   She was previously on Entresto however this apparently was discontinued around January or February when she was hospitalized in outlying hospital secondary to hypotension.  She states since then many of her symptoms began.  She denies palpitations, dyspnea, pnd, orthopnea, n, v, dizziness, syncope, weight gain, or early satiety.   Past Medical History:  Diagnosis Date   Breast cancer (HCC) 10/03/2021   CAD in native artery    Chronic atrial fibrillation (HCC) 10/03/2016   Chronic diastolic heart failure (HCC) 11/06/2016   Coronary artery disease involving native coronary artery of native heart without angina pectoris 07/28/2015   Diabetes mellitus due to underlying condition with unspecified complications (HCC) 07/28/2015   Dyslipidemia    Essential hypertension    GIB (gastrointestinal bleeding) 12/08/2022   Hx of CABG 10/03/2016   Overview:  2006   Hypertensive heart disease with heart failure (HCC) 07/28/2015   Hyponatremia with decreased serum osmolality 07/31/2017   Mitral regurgitation 02/10/2018   moderate   Thrombocytopenia (HCC) 12/08/2022   Type 2 diabetes mellitus (HCC)     Past Surgical History:  Procedure Laterality Date   CAROTID ENDARTERECTOMY     CATARACT EXTRACTION     CORONARY ARTERY BYPASS GRAFT      Current Medications: Current Meds  Medication Sig   allopurinol (ZYLOPRIM) 100 MG tablet Take 1 tablet (100 mg total) by mouth daily.   colchicine 0.6 MG tablet Take 1 tablet (0.6 mg total) by mouth daily. Please  take as needed for three days for gout flare up   famotidine (PEPCID) 20 MG tablet Take 1 tablet (20 mg total) by mouth daily.   levothyroxine (SYNTHROID) 75 MCG tablet Take 75 mcg by mouth daily.    loratadine (CLARITIN) 10 MG tablet Take 10 mg by mouth daily.   metoprolol succinate (TOPROL-XL) 50 MG 24 hr tablet Take 50 mg by mouth daily.   Multiple Vitamin (MULTIVITAMIN) capsule Take 1 capsule by mouth daily.    nitroGLYCERIN (NITROSTAT) 0.4 MG SL  tablet Place 1 tablet (0.4 mg total) under the tongue every 5 (five) minutes as needed for chest pain.   potassium chloride SA (KLOR-CON M) 20 MEQ tablet Take 1 tablet (20 mEq total) by mouth 2 (two) times daily.   simvastatin (ZOCOR) 10 MG tablet Take 10 mg by mouth daily.   Vitamin D, Ergocalciferol, (DRISDOL) 50000 units CAPS capsule Take 50,000 Units by mouth every 14 (fourteen) days.   [DISCONTINUED] furosemide (LASIX) 20 MG tablet Take 1 tablet (20 mg total) by mouth daily.   [DISCONTINUED] torsemide (DEMADEX) 20 MG tablet Take 2 tablets (40 mg total) by mouth 2 (two) times daily.     Allergies:   Nabumetone and Sulfa antibiotics   Social History   Socioeconomic History   Marital status: Married    Spouse name: Not on file   Number of children: Not on file   Years of education: Not on file   Highest education level: Not on file  Occupational History   Not on file  Tobacco Use   Smoking status: Never    Passive exposure: Never   Smokeless tobacco: Never  Vaping Use   Vaping Use: Never used  Substance and Sexual Activity   Alcohol use: Not Currently   Drug use: Never   Sexual activity: Not on file  Other Topics Concern   Not on file  Social History Narrative   Not on file   Social Determinants of Health   Financial Resource Strain: Low Risk  (10/03/2022)   Overall Financial Resource Strain (CARDIA)    Difficulty of Paying Living Expenses: Not hard at all  Food Insecurity: No Food Insecurity (10/03/2022)   Hunger Vital Sign    Worried About Running Out of Food in the Last Year: Never true    Ran Out of Food in the Last Year: Never true  Transportation Needs: No Transportation Needs (10/03/2022)   PRAPARE - Administrator, Civil Service (Medical): No    Lack of Transportation (Non-Medical): No  Physical Activity: Insufficiently Active (10/03/2022)   Exercise Vital Sign    Days of Exercise per Week: 2 days    Minutes of Exercise per Session: 10 min  Stress:  No Stress Concern Present (10/03/2022)   [employer] of Occupational Health - Occupational Stress Questionnaire    Feeling of Stress : Not at all  Social Connections: Not on file     Family History: The patient's family history includes Cancer in her sister; Heart disease in her brother and mother.  ROS:   Please see the history of present illness.     All other systems reviewed and are negative.  EKGs/Labs/Other Studies Reviewed:    The following studies were reviewed today: Cardiac Studies & Procedures       ECHOCARDIOGRAM  ECHOCARDIOGRAM COMPLETE 05/26/2021  Narrative ECHOCARDIOGRAM REPORT    Patient Name:   Angel Gentry [facility] Date of Exam: 05/25/2021 Medical Rec #:  [id number]  Height:       61.0 in Accession #:    [id number]                   Weight:       136.6 lb Date of Birth:  1935/09/03                    BSA:          1.606 m Patient Age:    86 years                     BP:           166/64 mmHg Patient Gender: F                            HR:           85 bpm. Exam Location:  [place]  Procedure: 2D Echo  Indications:    Chronic diastolic congestive heart failure (HCC) [I50.32 (ICD-10-CM)]; Hypertensive heart disease with heart failure (HCC) [I11.0 (ICD-10-CM)]; Chronic atrial fibrillation (HCC) [I48.20 (ICD-10-CM)]; Chronic anticoagulation [Z79.01 (ICD-10-CM)]; Coronary artery disease involving native coronary artery of native heart without angina pectoris [I25.10 (ICD-10-CM)]; Dyslipidemia [E78.5 (ICD-10-CM)]  History:        Patient has prior history of Echocardiogram examinations, most recent 12/10/2017. CAD, Prior CABG, Signs/Symptoms:Hypertensive Heart Disease; Risk Factors:Diabetes.  Sonographer:    Louie Boston Referring Phys: [phone] BRIAN J MUNLEY  IMPRESSIONS   1. Left ventricular ejection fraction, by estimation, is 55 to 60%. The left ventricle has normal function. The left ventricle has no regional wall motion  abnormalities. Left ventricular diastolic parameters are indeterminate. 2. Right ventricular systolic function is normal. The right ventricular size is normal. There is moderately elevated pulmonary artery systolic pressure. 3. Left atrial size was mildly dilated. 4. Right atrial size was severely dilated. 5. The mitral valve is normal in structure. Mild mitral valve regurgitation. No evidence of mitral stenosis. 6. Tricuspid valve regurgitation is severe. 7. The aortic valve is normal in structure. There is mild calcification of the aortic valve. There is mild thickening of the aortic valve. Aortic valve regurgitation is mild. No aortic stenosis is present. 8. The inferior vena cava is normal in size with greater than 50% respiratory variability, suggesting right atrial pressure of 3 mmHg.  FINDINGS Left Ventricle: Left ventricular ejection fraction, by estimation, is 55 to 60%. The left ventricle has normal function. The left ventricle has no regional wall motion abnormalities. The left ventricular internal cavity size was normal in size. There is no left ventricular hypertrophy. Left ventricular diastolic parameters are indeterminate.  Right Ventricle: The right ventricular size is normal. No increase in right ventricular wall thickness. Right ventricular systolic function is normal. There is moderately elevated pulmonary artery systolic pressure. The tricuspid regurgitant velocity is 3.11 m/s, and with an assumed right atrial pressure of 8 mmHg, the estimated right ventricular systolic pressure is 46.7 mmHg.  Left Atrium: Left atrial size was mildly dilated.  Right Atrium: Right atrial size was severely dilated.  Pericardium: There is no evidence of pericardial effusion.  Mitral Valve: The mitral valve is normal in structure. Mild mitral valve regurgitation. No evidence of mitral valve stenosis.  Tricuspid Valve: The tricuspid valve is normal in structure. Tricuspid valve regurgitation is  severe. No evidence of tricuspid stenosis.  Aortic Valve: The aortic valve is normal in structure. There is mild calcification of  the aortic valve. There is mild thickening of the aortic valve. Aortic valve regurgitation is mild. No aortic stenosis is present.  Pulmonic Valve: The pulmonic valve was normal in structure. Pulmonic valve regurgitation is not visualized. No evidence of pulmonic stenosis.  Aorta: The aortic root is normal in size and structure.  Venous: The inferior vena cava is normal in size with greater than 50% respiratory variability, suggesting right atrial pressure of 3 mmHg.  IAS/Shunts: No atrial level shunt detected by color flow Doppler.   LEFT VENTRICLE PLAX 2D LVIDd:         4.10 cm     Diastology LVIDs:         2.60 cm     LV e' medial:    8.49 cm/s LV PW:         1.00 cm     LV E/e' medial:  10.4 LV IVS:        1.10 cm     LV e' lateral:   15.60 cm/s LVOT diam:     2.00 cm     LV E/e' lateral: 5.7 LV SV:         41 LV SV Index:   25 LVOT Area:     3.14 cm  LV Volumes (MOD) LV vol d, MOD A2C: 21.2 ml LV vol d, MOD A4C: 27.4 ml LV vol s, MOD A2C: 10.5 ml LV vol s, MOD A4C: 14.1 ml LV SV MOD A2C:     10.7 ml LV SV MOD A4C:     27.4 ml LV SV MOD BP:      12.7 ml  RIGHT VENTRICLE            IVC RV S prime:     8.27 cm/s  IVC diam: 2.60 cm TAPSE (M-mode): 1.8 cm  LEFT ATRIUM             Index       RIGHT ATRIUM           Index LA diam:        4.90 cm 3.05 cm/m  RA Area:     24.00 cm LA Vol (A2C):   59.9 ml 37.29 ml/m RA Volume:   83.10 ml  51.74 ml/m LA Vol (A4C):   48.9 ml 30.44 ml/m LA Biplane Vol: 56.4 ml 35.11 ml/m AORTIC VALVE LVOT Vmax:   65.30 cm/s LVOT Vmean:  45.800 cm/s LVOT VTI:    0.130 m  AORTA Ao Root diam: 3.10 cm Ao Asc diam:  3.30 cm Ao Desc diam: 2.00 cm  MITRAL VALVE               TRICUSPID VALVE MV Area (PHT): 5.06 cm    TR Peak grad:   38.7 mmHg MV Decel Time: 150 msec    TR Vmax:        311.00 cm/s MV E  velocity: 88.70 cm/s SHUNTS Systemic VTI:  0.13 m Systemic Diam: 2.00 cm  Gypsy Balsam MD Electronically signed by Gypsy Balsam MD Signature Date/Time: 05/26/2021/10:20:53 AM    Final              EKG:  EKG is not ordered today.    Recent Labs: 08/24/2022: NT-Pro BNP 980 02/27/2023: B Natriuretic Peptide 228.6; BUN 23; Creatinine, Ser 0.97; Hemoglobin 13.0; Platelets 153; Potassium 3.3; Sodium 136  Recent Lipid Panel    Component Value Date/Time   CHOL 139 05/28/2020 0835   TRIG 155 (H) 05/28/2020 1610  HDL 45 05/28/2020 0835   CHOLHDL 3.1 05/28/2020 0835   LDLCALC 67 05/28/2020 0835     Risk Assessment/Calculations:    CHA2DS2-VASc Score = 7   This indicates a 11.2% annual risk of stroke. The patient's score is based upon: CHF History: 1 HTN History: 1 Diabetes History: 1 Stroke History: 0 Vascular Disease History: 1 Age Score: 2 Gender Score: 1               Physical Exam:    VS:  BP 130/80 (BP Location: Right Arm, Patient Position: Sitting, Cuff Size: Normal)   Pulse 82   Ht 5' (1.524 m)   Wt 137 lb (62.1 kg)   SpO2 98%   BMI 26.76 kg/m     Wt Readings from Last 3 Encounters:  03/02/23 137 lb (62.1 kg)  02/27/23 135 lb (61.2 kg)  12/06/22 135 lb (61.2 kg)     GEN:  Well nourished, well developed in no acute distress HEENT: Normal NECK: No JVD; No carotid bruits LYMPHATICS: No lymphadenopathy CARDIAC: irregularly irregular, no murmurs, rubs, gallops RESPIRATORY:  trace bilateral rales, no wheezing or rhonchi  ABDOMEN: Soft, non-tender, non-distended MUSCULOSKELETAL: trace pedal edema,  No deformity  SKIN: Warm and dry NEUROLOGIC:  Alert and oriented x 3 PSYCHIATRIC:  Normal affect   ASSESSMENT:    1. Chronic diastolic heart failure (HCC)   2. Chronic anticoagulation   3. Coronary artery disease involving native coronary artery of native heart without angina pectoris   4. Dyslipidemia   5. Chronic atrial fibrillation (HCC)    6. SOB (shortness of breath) on exertion    PLAN:    In order of problems listed above:  Chronic diastolic heart failure - most recent EF 55-60%, NYHA class II. She did not have good diuresing with lasix. Was previously on Entresto, which was stopped with during a hospitalization around February.  Repeat echocardiogram.  Will repeat her BMET, plan to restart Entresto. Resume Demadex 40 mg twice daily. Continue metoprolol succinate 50 mg daily.  Consider SGLT2, MRA at future visits. Chronic AF/hypercoagulable state - HR 82 today in office. Not currently on DOAC secondary to GI bleed, and ITP. Continue metoprolol succinate 50 mg daily.  CAD - s/p CABG x 6 2017. Recent ED visit for chest pain, cardiac workup was negative. She describes chest tightness that sounds to be most consistent with volume overload.  Dyslipidemia-most recent LDL was 67, continue Zocor 10 mg daily.  Will plan to repeat FLP and LFTs at next visit.  Disposition-BMET, proBNP today, resume torsemide 40 mg twice daily, repeat echocardiogram.            Medication Adjustments/Labs and Tests Ordered: Current medicines are reviewed at length with the patient today.  Concerns regarding medicines are outlined above.  Orders Placed This Encounter  Procedures   Basic metabolic panel   Pro b natriuretic peptide (BNP)   ECHOCARDIOGRAM COMPLETE   Meds ordered this encounter  Medications   torsemide 40 MG TABS    Sig: Take 40 mg by mouth 2 (two) times daily.    Dispense:  180 tablet    Refill:  3    Patient Instructions  Medication Instructions:  Your physician has recommended you make the following change in your medication:   Today at 2:00 take your Torsemide dose and resume Torsemide 40 mg twice daily tomorrow.  *If you need a refill on your cardiac medications before your next appointment, please call your pharmacy*   Lab  Work: Your physician recommends that you have a ProBNP and BMP today in the office.  If  you have labs (blood work) drawn today and your tests are completely normal, you will receive your results only by: MyChart Message (if you have MyChart) OR A paper copy in the mail If you have any lab test that is abnormal or we need to change your treatment, we will call you to review the results.   Testing/Procedures: Your physician has requested that you have an echocardiogram. Echocardiography is a painless test that uses sound waves to create images of your heart. It provides your doctor with information about the size and shape of your heart and how well your heart's chambers and valves are working. This procedure takes approximately one hour. There are no restrictions for this procedure. Please do NOT wear cologne, perfume, aftershave, or lotions (deodorant is allowed). Please arrive 15 minutes prior to your appointment time.     Follow-Up: At [facility], you and your health needs are our priority.  As part of our continuing mission to provide you with exceptional heart care, we have created designated Provider Care Teams.  These Care Teams include your primary Cardiologist (physician) and Advanced Practice Providers (APPs -  Physician Assistants and Nurse Practitioners) who all work together to provide you with the care you need, when you need it.  We recommend signing up for the patient portal called "MyChart".  Sign up information is provided on this After Visit Summary.  MyChart is used to connect with patients for Virtual Visits (Telemedicine).  Patients are able to view lab/test results, encounter notes, upcoming appointments, etc.  Non-urgent messages can be sent to your provider as well.   To learn more about what you can do with MyChart, go to [email or web address].    Your next appointment:   1 month(s)  The format for your next appointment:   In Person  Provider:   Wallis Bamberg, NP Rosalita Levan)   Other Instructions Echocardiogram An echocardiogram is a test  that uses sound waves (ultrasound) to produce images of the heart. Images from an echocardiogram can provide important information about: Heart size and shape. The size and thickness and movement of your heart's walls. Heart muscle function and strength. Heart valve function or if you have stenosis. Stenosis is when the heart valves are too narrow. If blood is flowing backward through the heart valves (regurgitation). A tumor or infectious growth around the heart valves. Areas of heart muscle that are not working well because of poor blood flow or injury from a heart attack. Aneurysm detection. An aneurysm is a weak or damaged part of an artery wall. The wall bulges out from the normal force of blood pumping through the body. Tell a health care provider about: Any allergies you have. All medicines you are taking, including vitamins, herbs, eye drops, creams, and over-the-counter medicines. Any blood disorders you have. Any surgeries you have had. Any medical conditions you have. Whether you are pregnant or may be pregnant. What are the risks? Generally, this is a safe test. However, problems may occur, including an allergic reaction to dye (contrast) that may be used during the test. What happens before the test? No specific preparation is needed. You may eat and drink normally. What happens during the test? You will take off your clothes from the waist up and put on a hospital gown. Electrodes or electrocardiogram (ECG)patches may be placed on your chest. The electrodes or patches are then connected to  a device that monitors your heart rate and rhythm. You will lie down on a table for an ultrasound exam. A gel will be applied to your chest to help sound waves pass through your skin. A handheld device, called a transducer, will be pressed against your chest and moved over your heart. The transducer produces sound waves that travel to your heart and bounce back (or "echo" back) to the  transducer. These sound waves will be captured in real-time and changed into images of your heart that can be viewed on a video monitor. The images will be recorded on a computer and reviewed by your health care provider. You may be asked to change positions or hold your breath for a short time. This makes it easier to get different views or better views of your heart. In some cases, you may receive contrast through an IV in one of your veins. This can improve the quality of the pictures from your heart. The procedure may vary among health care providers and hospitals.   What can I expect after the test? You may return to your normal, everyday life, including diet, activities, and medicines, unless your health care provider tells you not to do that. Follow these instructions at home: It is up to you to get the results of your test. Ask your health care provider, or the department that is doing the test, when your results will be ready. Keep all follow-up visits. This is important. Summary An echocardiogram is a test that uses sound waves (ultrasound) to produce images of the heart. Images from an echocardiogram can provide important information about the size and shape of your heart, heart muscle function, heart valve function, and other possible heart problems. You do not need to do anything to prepare before this test. You may eat and drink normally. After the echocardiogram is completed, you may return to your normal, everyday life, unless your health care provider tells you not to do that. This information is not intended to replace advice given to you by your health care provider. Make sure you discuss any questions you have with your health care provider. Document Revised: 06/08/2020 Document Reviewed: 06/08/2020 Elsevier Patient Education  2021 [employer]   Important Information About Sugar          Signed, Flossie Dibble, NP  03/02/2023 12:15 PM    [facility]

## 2023-03-02 ENCOUNTER — Encounter: Payer: Self-pay | Admitting: Cardiology

## 2023-03-02 ENCOUNTER — Ambulatory Visit: Payer: Medicare Other | Attending: Cardiology | Admitting: Cardiology

## 2023-03-02 VITALS — BP 130/80 | HR 82 | Ht 60.0 in | Wt 137.0 lb

## 2023-03-02 DIAGNOSIS — I482 Chronic atrial fibrillation, unspecified: Secondary | ICD-10-CM

## 2023-03-02 DIAGNOSIS — I2510 Atherosclerotic heart disease of native coronary artery without angina pectoris: Secondary | ICD-10-CM | POA: Diagnosis present

## 2023-03-02 DIAGNOSIS — E785 Hyperlipidemia, unspecified: Secondary | ICD-10-CM | POA: Diagnosis present

## 2023-03-02 DIAGNOSIS — I5032 Chronic diastolic (congestive) heart failure: Secondary | ICD-10-CM

## 2023-03-02 DIAGNOSIS — Z7901 Long term (current) use of anticoagulants: Secondary | ICD-10-CM

## 2023-03-02 DIAGNOSIS — R0602 Shortness of breath: Secondary | ICD-10-CM | POA: Diagnosis present

## 2023-03-02 MED ORDER — TORSEMIDE 40 MG PO TABS: 40.0000 mg | ORAL_TABLET | Freq: Two times a day (BID) | ORAL | 3 refills | Status: DC

## 2023-03-02 NOTE — Patient Instructions (Addendum)
Medication Instructions:  Your physician has recommended you make the following change in your medication:   Today at 2:00 take your Torsemide dose and resume Torsemide 40 mg twice daily tomorrow.  *If you need a refill on your cardiac medications before your next appointment, please call your pharmacy*   Lab Work: Your physician recommends that you have a ProBNP and BMP today in the office.  If you have labs (blood work) drawn today and your tests are completely normal, you will receive your results only by: MyChart Message (if you have MyChart) OR A paper copy in the mail If you have any lab test that is abnormal or we need to change your treatment, we will call you to review the results.   Testing/Procedures: Your physician has requested that you have an echocardiogram. Echocardiography is a painless test that uses sound waves to create images of your heart. It provides your doctor with information about the size and shape of your heart and how well your heart's chambers and valves are working. This procedure takes approximately one hour. There are no restrictions for this procedure. Please do NOT wear cologne, perfume, aftershave, or lotions (deodorant is allowed). Please arrive 15 minutes prior to your appointment time.     Follow-Up: At [facility], you and your health needs are our priority.  As part of our continuing mission to provide you with exceptional heart care, we have created designated Provider Care Teams.  These Care Teams include your primary Cardiologist (physician) and Advanced Practice Providers (APPs -  Physician Assistants and Nurse Practitioners) who all work together to provide you with the care you need, when you need it.  We recommend signing up for the patient portal called "MyChart".  Sign up information is provided on this After Visit Summary.  MyChart is used to connect with patients for Virtual Visits (Telemedicine).  Patients are able to view lab/test  results, encounter notes, upcoming appointments, etc.  Non-urgent messages can be sent to your provider as well.   To learn more about what you can do with MyChart, go to [email or web address].    Your next appointment:   1 month(s)  The format for your next appointment:   In Person  Provider:   Wallis Bamberg, NP Rosalita Levan)   Other Instructions Echocardiogram An echocardiogram is a test that uses sound waves (ultrasound) to produce images of the heart. Images from an echocardiogram can provide important information about: Heart size and shape. The size and thickness and movement of your heart's walls. Heart muscle function and strength. Heart valve function or if you have stenosis. Stenosis is when the heart valves are too narrow. If blood is flowing backward through the heart valves (regurgitation). A tumor or infectious growth around the heart valves. Areas of heart muscle that are not working well because of poor blood flow or injury from a heart attack. Aneurysm detection. An aneurysm is a weak or damaged part of an artery wall. The wall bulges out from the normal force of blood pumping through the body. Tell a health care provider about: Any allergies you have. All medicines you are taking, including vitamins, herbs, eye drops, creams, and over-the-counter medicines. Any blood disorders you have. Any surgeries you have had. Any medical conditions you have. Whether you are pregnant or may be pregnant. What are the risks? Generally, this is a safe test. However, problems may occur, including an allergic reaction to dye (contrast) that may be used during the test. What happens before  the test? No specific preparation is needed. You may eat and drink normally. What happens during the test? You will take off your clothes from the waist up and put on a hospital gown. Electrodes or electrocardiogram (ECG)patches may be placed on your chest. The electrodes or patches are then  connected to a device that monitors your heart rate and rhythm. You will lie down on a table for an ultrasound exam. A gel will be applied to your chest to help sound waves pass through your skin. A handheld device, called a transducer, will be pressed against your chest and moved over your heart. The transducer produces sound waves that travel to your heart and bounce back (or "echo" back) to the transducer. These sound waves will be captured in real-time and changed into images of your heart that can be viewed on a video monitor. The images will be recorded on a computer and reviewed by your health care provider. You may be asked to change positions or hold your breath for a short time. This makes it easier to get different views or better views of your heart. In some cases, you may receive contrast through an IV in one of your veins. This can improve the quality of the pictures from your heart. The procedure may vary among health care providers and hospitals.   What can I expect after the test? You may return to your normal, everyday life, including diet, activities, and medicines, unless your health care provider tells you not to do that. Follow these instructions at home: It is up to you to get the results of your test. Ask your health care provider, or the department that is doing the test, when your results will be ready. Keep all follow-up visits. This is important. Summary An echocardiogram is a test that uses sound waves (ultrasound) to produce images of the heart. Images from an echocardiogram can provide important information about the size and shape of your heart, heart muscle function, heart valve function, and other possible heart problems. You do not need to do anything to prepare before this test. You may eat and drink normally. After the echocardiogram is completed, you may return to your normal, everyday life, unless your health care provider tells you not to do that. This  information is not intended to replace advice given to you by your health care provider. Make sure you discuss any questions you have with your health care provider. Document Revised: 06/08/2020 Document Reviewed: 06/08/2020 Elsevier Patient Education  2021 Elsevier Inc.   Important Information About Sugar

## 2023-03-03 LAB — PRO B NATRIURETIC PEPTIDE: NT-Pro BNP: 1283 pg/mL — ABNORMAL HIGH (ref 0–738)

## 2023-03-03 LAB — BASIC METABOLIC PANEL WITH GFR
BUN/Creatinine Ratio: 24 (ref 12–28)
BUN: 22 mg/dL (ref 8–27)
CO2: 24 mmol/L (ref 20–29)
Calcium: 9.7 mg/dL (ref 8.7–10.3)
Chloride: 101 mmol/L (ref 96–106)
Creatinine, Ser: 0.93 mg/dL (ref 0.57–1.00)
Glucose: 150 mg/dL — ABNORMAL HIGH (ref 70–99)
Potassium: 4.7 mmol/L (ref 3.5–5.2)
Sodium: 138 mmol/L (ref 134–144)
eGFR: 60 mL/min/1.73

## 2023-03-05 ENCOUNTER — Telehealth: Payer: Self-pay

## 2023-03-05 DIAGNOSIS — D696 Thrombocytopenia, unspecified: Secondary | ICD-10-CM | POA: Diagnosis not present

## 2023-03-05 DIAGNOSIS — I10 Essential (primary) hypertension: Secondary | ICD-10-CM

## 2023-03-05 DIAGNOSIS — I110 Hypertensive heart disease with heart failure: Secondary | ICD-10-CM

## 2023-03-05 DIAGNOSIS — I5032 Chronic diastolic (congestive) heart failure: Secondary | ICD-10-CM

## 2023-03-05 MED ORDER — SACUBITRIL-VALSARTAN 24-26 MG PO TABS: 1.0000 | ORAL_TABLET | Freq: Two times a day (BID) | ORAL | 2 refills | Status: DC

## 2023-03-05 NOTE — Telephone Encounter (Signed)
-----   Message from Flossie Dibble, NP sent at 03/04/2023 12:51 PM EDT ----- [name], Your blood work indicated you are holding on to too much fluid. I want to restart Entresto 24-26 mg twice daily, this was the same dosage you were on before. Adhere to < 64 ounces/fluid/day, avoid salt. I am hoping since we restarted your demadex you will feel slightly better as well. I will need you to come back in 2 weeks so we can repeat your BMET so I can make sure you kidney function is ok after restarting Entresto. Are you feeling any better since starting demadex? Best, Victorino Dike

## 2023-03-06 DIAGNOSIS — D696 Thrombocytopenia, unspecified: Secondary | ICD-10-CM | POA: Diagnosis not present

## 2023-03-19 ENCOUNTER — Telehealth: Payer: Self-pay | Admitting: *Deleted

## 2023-03-19 ENCOUNTER — Other Ambulatory Visit: Payer: Self-pay | Admitting: Cardiology

## 2023-03-19 ENCOUNTER — Other Ambulatory Visit: Payer: Self-pay | Admitting: *Deleted

## 2023-03-19 ENCOUNTER — Ambulatory Visit: Payer: Medicare Other | Attending: Cardiology

## 2023-03-19 DIAGNOSIS — I5032 Chronic diastolic (congestive) heart failure: Secondary | ICD-10-CM

## 2023-03-19 DIAGNOSIS — Z7901 Long term (current) use of anticoagulants: Secondary | ICD-10-CM | POA: Diagnosis not present

## 2023-03-19 DIAGNOSIS — I482 Chronic atrial fibrillation, unspecified: Secondary | ICD-10-CM | POA: Diagnosis not present

## 2023-03-19 DIAGNOSIS — I110 Hypertensive heart disease with heart failure: Secondary | ICD-10-CM | POA: Diagnosis not present

## 2023-03-19 DIAGNOSIS — I10 Essential (primary) hypertension: Secondary | ICD-10-CM | POA: Diagnosis not present

## 2023-03-19 DIAGNOSIS — R0602 Shortness of breath: Secondary | ICD-10-CM

## 2023-03-19 LAB — ECHOCARDIOGRAM COMPLETE: S' Lateral: 2.9 cm

## 2023-03-19 NOTE — Telephone Encounter (Signed)
While pt here to get labs asked if she could get her platelets checked because they were low in the hospital. Angel Gentry by Wallis Bamberg.

## 2023-03-20 LAB — CBC WITH DIFFERENTIAL/PLATELET
Basophils Absolute: 0 x10E3/uL (ref 0.0–0.2)
Basos: 1 %
EOS (ABSOLUTE): 0.2 x10E3/uL (ref 0.0–0.4)
Eos: 2 %
Hematocrit: 41.8 % (ref 34.0–46.6)
Hemoglobin: 13.8 g/dL (ref 11.1–15.9)
Immature Grans (Abs): 0 x10E3/uL (ref 0.0–0.1)
Immature Granulocytes: 0 %
Lymphocytes Absolute: 1.9 x10E3/uL (ref 0.7–3.1)
Lymphs: 28 %
MCH: 29.3 pg (ref 26.6–33.0)
MCHC: 33 g/dL (ref 31.5–35.7)
MCV: 89 fL (ref 79–97)
Monocytes Absolute: 0.4 x10E3/uL (ref 0.1–0.9)
Monocytes: 5 %
Neutrophils Absolute: 4.3 x10E3/uL (ref 1.4–7.0)
Neutrophils: 64 %
Platelets: 145 x10E3/uL — ABNORMAL LOW (ref 150–450)
RBC: 4.71 x10E6/uL (ref 3.77–5.28)
RDW: 13 % (ref 11.7–15.4)
WBC: 6.8 x10E3/uL (ref 3.4–10.8)

## 2023-03-20 LAB — BASIC METABOLIC PANEL WITH GFR
BUN/Creatinine Ratio: 23 (ref 12–28)
BUN: 29 mg/dL — ABNORMAL HIGH (ref 8–27)
CO2: 26 mmol/L (ref 20–29)
Calcium: 9.6 mg/dL (ref 8.7–10.3)
Chloride: 99 mmol/L (ref 96–106)
Creatinine, Ser: 1.25 mg/dL — ABNORMAL HIGH (ref 0.57–1.00)
Glucose: 201 mg/dL — ABNORMAL HIGH (ref 70–99)
Potassium: 4.2 mmol/L (ref 3.5–5.2)
Sodium: 140 mmol/L (ref 134–144)
eGFR: 42 mL/min/1.73 — ABNORMAL LOW

## 2023-03-22 DIAGNOSIS — E119 Type 2 diabetes mellitus without complications: Secondary | ICD-10-CM | POA: Diagnosis not present

## 2023-03-22 DIAGNOSIS — E039 Hypothyroidism, unspecified: Secondary | ICD-10-CM | POA: Diagnosis not present

## 2023-03-22 DIAGNOSIS — E7800 Pure hypercholesterolemia, unspecified: Secondary | ICD-10-CM | POA: Diagnosis not present

## 2023-03-22 LAB — LAB REPORT - SCANNED
A1c: 7.8
EGFR: 50

## 2023-03-27 DIAGNOSIS — E119 Type 2 diabetes mellitus without complications: Secondary | ICD-10-CM | POA: Diagnosis not present

## 2023-03-27 DIAGNOSIS — Z6825 Body mass index (BMI) 25.0-25.9, adult: Secondary | ICD-10-CM | POA: Diagnosis not present

## 2023-04-01 NOTE — Progress Notes (Unsigned)
Cardiology Office Note:    Date:  04/01/2023   ID:  [name], [name] 25-Dec-1934, MRN [id number]  PCP:  Marylen Ponto, MD   [facility] Providers Cardiologist:  Norman Herrlich, MD     Referring MD: Marylen Ponto, MD  CC: ED follow up    History of Present Illness:    Angel Gentry is a 88 y.o. female with a hx of CAD s/p CABG x 6 2017, chronic diastolic heart failure, hypertension, atrial fibrillation not currently anticoagulated secondary to ITP and GI bleed, DM2, idiopathic thrombocytopenia, hyperlipidemia.  Echo on 05/25/21 EF 55 to 60%, moderately elevated PASP, LA mildly dilated, RA severely dilated, mild MR, mild calcification of the aortic valve, mild AR.  Most recently she was evaluated by Dr. Dulce Sellar on 12/06/22 for GI bleed. She was then admitted to the hospital on 12/08/22 for GI bleed.  Her Eliquis was held until further workup by GI.  She was eventually evaluated by hematology with [facility], and diagnosed with idiopathic thrombocytopenia.  She was evaluated in the ED on 02/27/2023 for chest pain, EKG showed atrial fibrillation.  She apparently has ongoing chest tightness that worsens with exertion, she did not endorse any new chest pain however her family was concerned that her pedal edema appeared to be worse and wanted her to be evaluated.  Ultrasound was negative for DVT. Her potassium was 3.3, GFR 57, BNP 228, D-dimer 0.60, troponin 15> 16.  She was given 40 mg IV Lasix, 60 mEq oral potassium.  She was discharged with instructions to take 20 Lasix twice daily x 3 days and follow-up with her cardiologist.  Most recently she was evaluated by our office on 03/02/2023 posthospitalization, she had been instructed to stop her Demadex and take Lasix however did not notice good diuresing with this.  We changed this back to Demadex twice daily.  We restarted her Entresto. Repeat echocardiogram was arranged and completed on 03/19/2023 which revealed an EF 60  to 65%, moderate concentric LVH, mildly elevated PASP, biatrial enlargement, mild MR, mild AR.  Repeat lab work after diuresing indicated slight rise in her creatinine so we decreased her Demadex dose to 40 in a.m. and 20 in p.m.   Past Medical History:  Diagnosis Date   Breast cancer (HCC) 10/03/2021   CAD in native artery    Chronic atrial fibrillation (HCC) 10/03/2016   Chronic diastolic heart failure (HCC) 11/06/2016   Coronary artery disease involving native coronary artery of native heart without angina pectoris 07/28/2015   Diabetes mellitus due to underlying condition with unspecified complications (HCC) 07/28/2015   Dyslipidemia    Essential hypertension    GIB (gastrointestinal bleeding) 12/08/2022   Hx of CABG 10/03/2016   Overview:  2006   Hypertensive heart disease with heart failure (HCC) 07/28/2015   Hyponatremia with decreased serum osmolality 07/31/2017   Mitral regurgitation 02/10/2018   moderate   Thrombocytopenia (HCC) 12/08/2022   Type 2 diabetes mellitus (HCC)     Past Surgical History:  Procedure Laterality Date   CAROTID ENDARTERECTOMY     CATARACT EXTRACTION     CORONARY ARTERY BYPASS GRAFT      Current Medications: No outpatient medications have been marked as taking for the 04/03/23 encounter (Appointment) with Flossie Dibble, NP.     Allergies:   Nabumetone and Sulfa antibiotics   Social History   Socioeconomic History   Marital status: Married    Spouse name: Not on file  Number of children: Not on file   Years of education: Not on file   Highest education level: Not on file  Occupational History   Not on file  Tobacco Use   Smoking status: Never    Passive exposure: Never   Smokeless tobacco: Never  Vaping Use   Vaping Use: Never used  Substance and Sexual Activity   Alcohol use: Not Currently   Drug use: Never   Sexual activity: Not on file  Other Topics Concern   Not on file  Social History Narrative   Not on file    Social Determinants of Health   Financial Resource Strain: Low Risk  (10/03/2022)   Overall Financial Resource Strain (CARDIA)    Difficulty of Paying Living Expenses: Not hard at all  Food Insecurity: No Food Insecurity (10/03/2022)   Hunger Vital Sign    Worried About Running Out of Food in the Last Year: Never true    Ran Out of Food in the Last Year: Never true  Transportation Needs: No Transportation Needs (10/03/2022)   PRAPARE - Administrator, Civil Service (Medical): No    Lack of Transportation (Non-Medical): No  Physical Activity: Insufficiently Active (10/03/2022)   Exercise Vital Sign    Days of Exercise per Week: 2 days    Minutes of Exercise per Session: 10 min  Stress: No Stress Concern Present (10/03/2022)   [employer] of Occupational Health - Occupational Stress Questionnaire    Feeling of Stress : Not at all  Social Connections: Not on file     Family History: The patient's family history includes Cancer in her sister; Heart disease in her brother and mother.  ROS:   Please see the history of present illness.     All other systems reviewed and are negative.  EKGs/Labs/Other Studies Reviewed:    The following studies were reviewed today: Cardiac Studies & Procedures       ECHOCARDIOGRAM  ECHOCARDIOGRAM COMPLETE 03/19/2023  Narrative ECHOCARDIOGRAM REPORT    Patient Name:   [facility] Date of Exam: 03/19/2023 Medical Rec #:  [id number]                    Height:       60.0 in Accession #:    [id number]                   Weight:       137.0 lb Date of Birth:  [birth year]                    BSA:          1.589 m Patient Age:    88 years                     BP:           130/80 mmHg Patient Gender: F                            HR:           91 bpm. Exam Location:  [facility]  Procedure: 2D Echo, Cardiac Doppler, Color Doppler and Strain Analysis  Indications:    Chronic diastolic heart failure (HCC) [I50.32  (ICD-10-CM)]; SOB (shortness of breath) on exertion [R06.02 (ICD-10-CM)]  History:        Patient has prior history of Echocardiogram examinations, most recent 05/26/2021. CAD, Prior  CABG, Mitral regurgitation, Arrythmias:Atrial Fibrillation, Signs/Symptoms:Edema and Chest Pain; Risk Factors:Diabetes, Dyslipidemia and Hypertension.  Sonographer:    Louie Boston RDCS Referring Phys: [phone] Flossie Dibble   Sonographer Comments: Global longitudinal strain was attempted. IMPRESSIONS   1. Left ventricular ejection fraction, by estimation, is 60 to 65%. The left ventricle has normal function. The left ventricle has no regional wall motion abnormalities. There is moderate concentric left ventricular hypertrophy. Left ventricular diastolic parameters are indeterminate. 2. Right ventricular systolic function is mildly reduced. The right ventricular size is normal. There is mildly elevated pulmonary artery systolic pressure. 3. Left atrial size was mild to moderately dilated. 4. Right atrial size was moderately dilated. 5. The mitral valve is normal in structure. Mild mitral valve regurgitation. No evidence of mitral stenosis. 6. Tricuspid valve regurgitation is moderate. 7. The aortic valve is tricuspid. Aortic valve regurgitation is mild. Aortic valve sclerosis is present, with no evidence of aortic valve stenosis. 8. Aortic Normal DTA. 9. The inferior vena cava is dilated in size with >50% respiratory variability, suggesting right atrial pressure of 8 mmHg.  FINDINGS Left Ventricle: Left ventricular ejection fraction, by estimation, is 60 to 65%. The left ventricle has normal function. The left ventricle has no regional wall motion abnormalities. The left ventricular internal cavity size was normal in size. There is moderate concentric left ventricular hypertrophy. Left ventricular diastolic parameters are indeterminate.  Right Ventricle: The right ventricular size is normal. No increase in  right ventricular wall thickness. Right ventricular systolic function is mildly reduced. There is mildly elevated pulmonary artery systolic pressure. The tricuspid regurgitant velocity is 2.89 m/s, and with an assumed right atrial pressure of 8 mmHg, the estimated right ventricular systolic pressure is 41.4 mmHg.  Left Atrium: Left atrial size was mild to moderately dilated.  Right Atrium: Right atrial size was moderately dilated.  Pericardium: There is no evidence of pericardial effusion.  Mitral Valve: The mitral valve is normal in structure. Mild mitral annular calcification. Mild mitral valve regurgitation. No evidence of mitral valve stenosis.  Tricuspid Valve: The tricuspid valve is normal in structure. Tricuspid valve regurgitation is moderate . No evidence of tricuspid stenosis.  Aortic Valve: The aortic valve is tricuspid. Aortic valve regurgitation is mild. Aortic valve sclerosis is present, with no evidence of aortic valve stenosis.  Pulmonic Valve: The pulmonic valve was normal in structure. Pulmonic valve regurgitation is not visualized. No evidence of pulmonic stenosis.  Aorta: Normal DTA, the aortic arch was not well visualized and the aortic root and ascending aorta are structurally normal, with no evidence of dilitation.  Venous: The inferior vena cava is dilated in size with greater than 50% respiratory variability, suggesting right atrial pressure of 8 mmHg.  IAS/Shunts: No atrial level shunt detected by color flow Doppler.   LEFT VENTRICLE PLAX 2D LVIDd:         3.30 cm   Diastology LVIDs:         2.90 cm   LV e' medial:    8.59 cm/s LV PW:         1.35 cm   LV E/e' medial:  10.2 LV IVS:        1.40 cm   LV e' lateral:   11.90 cm/s LVOT diam:     2.00 cm   LV E/e' lateral: 7.4 LV SV:         34 LV SV Index:   21 LVOT Area:     3.14 cm   RIGHT VENTRICLE  IVC RV Basal diam:  3.90 cm    IVC diam: 2.10 cm RV S prime:     9.79 cm/s TAPSE (M-mode): 1.5  cm  LEFT ATRIUM             Index        RIGHT ATRIUM           Index LA diam:        4.40 cm 2.77 cm/m   RA Area:     23.60 cm LA Vol (A2C):   65.2 ml 41.02 ml/m  RA Volume:   75.50 ml  47.50 ml/m LA Vol (A4C):   59.6 ml 37.50 ml/m LA Biplane Vol: 63.8 ml 40.14 ml/m AORTIC VALVE LVOT Vmax:   57.10 cm/s LVOT Vmean:  37.200 cm/s LVOT VTI:    0.108 m  AORTA Ao Root diam: 2.85 cm Ao Asc diam:  3.50 cm Ao Desc diam: 2.10 cm  MV E velocity: 88.00 cm/s  TRICUSPID VALVE MV A velocity: 43.50 cm/s  TR Peak grad:   33.4 mmHg MV E/A ratio:  2.02        TR Vmax:        289.00 cm/s  SHUNTS Systemic VTI:  0.11 m Systemic Diam: 2.00 cm  Norman Herrlich MD Electronically signed by Norman Herrlich MD Signature Date/Time: 03/19/2023/3:27:46 PM    Final              EKG:  EKG is not ordered today.    Recent Labs: 02/27/2023: B Natriuretic Peptide 228.6 03/02/2023: NT-Pro BNP 1,283 03/19/2023: BUN 29; Creatinine, Ser 1.25; Hemoglobin 13.8; Platelets 145; Potassium 4.2; Sodium 140  Recent Lipid Panel    Component Value Date/Time   CHOL 139 05/28/2020 0835   TRIG 155 (H) 05/28/2020 0835   HDL 45 05/28/2020 0835   CHOLHDL 3.1 05/28/2020 0835   LDLCALC 67 05/28/2020 0835     Risk Assessment/Calculations:    CHA2DS2-VASc Score = 7   This indicates a 11.2% annual risk of stroke. The patient's score is based upon: CHF History: 1 HTN History: 1 Diabetes History: 1 Stroke History: 0 Vascular Disease History: 1 Age Score: 2 Gender Score: 1   {This patient has a significant risk of stroke if diagnosed with atrial fibrillation.  Please consider VKA or DOAC agent for anticoagulation if the bleeding risk is acceptable.   You can also use the SmartPhrase .[email or web address] for documentation.   :[id number]}  No BP recorded.  {Refresh Note OR Click here to enter BP  :1}***         Physical Exam:    VS:  There were no vitals taken for this visit.    Wt Readings from Last 3 Encounters:   03/02/23 137 lb (62.1 kg)  02/27/23 135 lb (61.2 kg)  12/06/22 135 lb (61.2 kg)     GEN:  Well nourished, well developed in no acute distress HEENT: Normal NECK: No JVD; No carotid bruits LYMPHATICS: No lymphadenopathy CARDIAC: irregularly irregular, no murmurs, rubs, gallops RESPIRATORY:  trace bilateral rales, no wheezing or rhonchi  ABDOMEN: Soft, non-tender, non-distended MUSCULOSKELETAL: trace pedal edema,  No deformity  SKIN: Warm and dry NEUROLOGIC:  Alert and oriented x 3 PSYCHIATRIC:  Normal affect   ASSESSMENT:    1. Chronic atrial fibrillation (HCC)   2. Chronic anticoagulation   3. Chronic diastolic heart failure (HCC)   4. Essential hypertension   5. Coronary artery disease involving native coronary artery of native heart without angina pectoris  6. Dyslipidemia   7. Idiopathic thrombocytopenia (HCC)    PLAN:    In order of problems listed above:  Chronic diastolic heart failure - most recent EF 60-65%, NYHA class . She did not have good diuresing with lasix.Consider SGLT2, MRA at future visits. Chronic AF/hypercoagulable state - HR . Not currently on DOAC secondary to GI bleed, and ITP. Continue metoprolol succinate 50 mg daily.  CAD - s/p CABG x 6 2017. Recent ED visit for chest pain, cardiac workup was negative. She describes chest tightness that sounds to be most consistent with volume overload.  Dyslipidemia-most recent LDL was 67, continue Zocor 10 mg daily.  Will plan to repeat FLP and LFTs at next visit.  Disposition-BMET, proBNP today, resume torsemide 40 mg twice daily, repeat echocardiogram.            Medication Adjustments/Labs and Tests Ordered: Current medicines are reviewed at length with the patient today.  Concerns regarding medicines are outlined above.  No orders of the defined types were placed in this encounter.  No orders of the defined types were placed in this encounter.   There are no Patient Instructions on file for this  visit.   Signed, Flossie Dibble, NP  04/01/2023 4:25 PM    [facility]

## 2023-04-03 ENCOUNTER — Encounter: Payer: Self-pay | Admitting: Cardiology

## 2023-04-03 ENCOUNTER — Ambulatory Visit: Payer: Medicare Other | Attending: Cardiology | Admitting: Cardiology

## 2023-04-03 ENCOUNTER — Telehealth: Payer: Self-pay | Admitting: Cardiology

## 2023-04-03 VITALS — BP 120/60 | HR 76 | Ht 60.0 in | Wt 129.0 lb

## 2023-04-03 DIAGNOSIS — E785 Hyperlipidemia, unspecified: Secondary | ICD-10-CM

## 2023-04-03 DIAGNOSIS — I482 Chronic atrial fibrillation, unspecified: Secondary | ICD-10-CM

## 2023-04-03 DIAGNOSIS — Z7901 Long term (current) use of anticoagulants: Secondary | ICD-10-CM | POA: Diagnosis present

## 2023-04-03 DIAGNOSIS — I5032 Chronic diastolic (congestive) heart failure: Secondary | ICD-10-CM | POA: Diagnosis present

## 2023-04-03 DIAGNOSIS — D693 Immune thrombocytopenic purpura: Secondary | ICD-10-CM | POA: Diagnosis present

## 2023-04-03 DIAGNOSIS — I2510 Atherosclerotic heart disease of native coronary artery without angina pectoris: Secondary | ICD-10-CM | POA: Diagnosis present

## 2023-04-03 DIAGNOSIS — I10 Essential (primary) hypertension: Secondary | ICD-10-CM | POA: Diagnosis present

## 2023-04-03 MED ORDER — APIXABAN 2.5 MG PO TABS: 2.5000 mg | ORAL_TABLET | Freq: Two times a day (BID) | ORAL | 3 refills | Status: DC

## 2023-04-03 NOTE — Patient Instructions (Addendum)
Medication Instructions:  Your physician recommends that you continue on your current medications as directed. Please refer to the Current Medication list given to you today.  *If you need a refill on your cardiac medications before your next appointment, please call your pharmacy*   Lab Work: None Ordered If you have labs (blood work) drawn today and your tests are completely normal, you will receive your results only by: MyChart Message (if you have MyChart) OR A paper copy in the mail If you have any lab test that is abnormal or we need to change your treatment, we will call you to review the results.   Testing/Procedures: None Ordered   Follow-Up: At [facility], you and your health needs are our priority.  As part of our continuing mission to provide you with exceptional heart care, we have created designated Provider Care Teams.  These Care Teams include your primary Cardiologist (physician) and Advanced Practice Providers (APPs -  Physician Assistants and Nurse Practitioners) who all work together to provide you with the care you need, when you need it.  We recommend signing up for the patient portal called "MyChart".  Sign up information is provided on this After Visit Summary.  MyChart is used to connect with patients for Virtual Visits (Telemedicine).  Patients are able to view lab/test results, encounter notes, upcoming appointments, etc.  Non-urgent messages can be sent to your provider as well.   To learn more about what you can do with MyChart, go to [email or web address].    Your next appointment:   3 month(s) with Dr. Dulce Sellar  The format for your next appointment:   In Person  Provider:   Wallis Bamberg, NP    Other Instructions NA

## 2023-04-03 NOTE — Telephone Encounter (Signed)
Spoke with pt regarding Eliquis. She agreed to restart Eliquis 2.5 BID. Sent to [facility]. She will come back in 2 weeks for CBC.

## 2023-04-03 NOTE — Progress Notes (Signed)
Cardiology Office Note:    Date:  04/03/2023   ID:  Angel Gentry, [name] [birth year], MRN [id number]  PCP:  Marylen Ponto, MD   [facility] Providers Cardiologist:  Norman Herrlich, MD     Referring MD: Marylen Ponto, MD  CC: ED follow up    History of Present Illness:    Angel Gentry is a 88 y.o. female with a hx of CAD s/p CABG x 6 2017, chronic diastolic heart failure, hypertension, atrial fibrillation not currently anticoagulated secondary to ITP and GI bleed, DM2, idiopathic thrombocytopenia, hyperlipidemia.  Echo on 05/25/21 EF 55 to 60%, moderately elevated PASP, LA mildly dilated, RA severely dilated, mild MR, mild calcification of the aortic valve, mild AR.  Most recently she was evaluated by Dr. Dulce Sellar on 12/06/22 for GI bleed. She was then admitted to the hospital on 12/08/22 for GI bleed.  Her Eliquis was held until further workup by GI.  She was eventually evaluated by hematology with [facility], and diagnosed with idiopathic thrombocytopenia.  She was evaluated in the ED on 02/27/2023 for chest pain, EKG showed atrial fibrillation.  She apparently has ongoing chest tightness that worsens with exertion, she did not endorse any new chest pain however her family was concerned that her pedal edema appeared to be worse and wanted her to be evaluated.  Ultrasound was negative for DVT. Her potassium was 3.3, GFR 57, BNP 228, D-dimer 0.60, troponin 15> 16.  She was given 40 mg IV Lasix, 60 mEq oral potassium.  She was discharged with instructions to take 20 Lasix twice daily x 3 days and follow-up with her cardiologist.  Most recently she was evaluated by our office on 03/02/2023 posthospitalization, she had been instructed to stop her Demadex and take Lasix however did not notice good diuresing with this.  We changed this back to Demadex twice daily.  We restarted her Entresto. Repeat echocardiogram was arranged and completed on 03/19/2023 which revealed an EF 60  to 65%, moderate concentric LVH, mildly elevated PASP, biatrial enlargement, mild MR, mild AR.  Repeat lab work after diuresing indicated slight rise in her creatinine so we decreased her Demadex dose to 40 in a.m. and 20 in p.m.  She presents today for follow-up accompanied by her husband, reports that she is feeling better and her shortness of breath has largely subsided.  Her weight in the office today is 129, down from 137 at her last OV a month ago.  She is weighing herself daily and checking her blood pressure, she presents a log of both and her weight and blood pressure are both well-controlled.  She endorsed some increased thirst, we discussed she could increase her fluid intake somewhat but to continue to be cognizant of her weight.  She had been evaluated by hematology for her ITP, will now follow-up with her PCP regarding this. She denies chest pain, palpitations, dyspnea, pnd, orthopnea, n, v, dizziness, syncope, edema, weight gain, or early satiety.   Past Medical History:  Diagnosis Date   Breast cancer (HCC) 10/03/2021   CAD in native artery    Chronic atrial fibrillation (HCC) 10/03/2016   Chronic diastolic heart failure (HCC) 11/06/2016   Coronary artery disease involving native coronary artery of native heart without angina pectoris 07/28/2015   Diabetes mellitus due to underlying condition with unspecified complications (HCC) 07/28/2015   Dyslipidemia    Essential hypertension    GIB (gastrointestinal bleeding) 12/08/2022   Hx of CABG 10/03/2016  Overview:  2006   Hypertensive heart disease with heart failure (HCC) 07/28/2015   Hyponatremia with decreased serum osmolality 07/31/2017   Mitral regurgitation 02/10/2018   moderate   Thrombocytopenia (HCC) 12/08/2022   Type 2 diabetes mellitus (HCC)     Past Surgical History:  Procedure Laterality Date   CAROTID ENDARTERECTOMY     CATARACT EXTRACTION     CORONARY ARTERY BYPASS GRAFT      Current Medications: Current  Meds  Medication Sig   allopurinol (ZYLOPRIM) 100 MG tablet Take 1 tablet (100 mg total) by mouth daily.   colchicine 0.6 MG tablet Take 1 tablet (0.6 mg total) by mouth daily. Please take as needed for three days for gout flare up   famotidine (PEPCID) 20 MG tablet Take 1 tablet (20 mg total) by mouth daily.   JARDIANCE 10 MG TABS tablet Take 10 mg by mouth daily.   levothyroxine (SYNTHROID) 75 MCG tablet Take 75 mcg by mouth daily.    loratadine (CLARITIN) 10 MG tablet Take 10 mg by mouth daily.   metoprolol succinate (TOPROL-XL) 50 MG 24 hr tablet Take 50 mg by mouth daily.   Multiple Vitamin (MULTIVITAMIN) capsule Take 1 capsule by mouth daily.    nitroGLYCERIN (NITROSTAT) 0.4 MG SL tablet Place 1 tablet (0.4 mg total) under the tongue every 5 (five) minutes as needed for chest pain.   potassium chloride SA (KLOR-CON M) 20 MEQ tablet Take 1 tablet (20 mEq total) by mouth 2 (two) times daily.   sacubitril-valsartan (ENTRESTO) 24-26 MG Take 1 tablet by mouth 2 (two) times daily.   simvastatin (ZOCOR) 10 MG tablet Take 10 mg by mouth daily.   torsemide (DEMADEX) 20 MG tablet Take 40 mg by mouth every morning. And 20 mg in the evening   Vitamin D, Ergocalciferol, (DRISDOL) 50000 units CAPS capsule Take 50,000 Units by mouth every 14 (fourteen) days.     Allergies:   Nabumetone and Sulfa antibiotics   Social History   Socioeconomic History   Marital status: Married    Spouse name: Not on file   Number of children: Not on file   Years of education: Not on file   Highest education level: Not on file  Occupational History   Not on file  Tobacco Use   Smoking status: Never    Passive exposure: Never   Smokeless tobacco: Never  Vaping Use   Vaping Use: Never used  Substance and Sexual Activity   Alcohol use: Not Currently   Drug use: Never   Sexual activity: Not on file  Other Topics Concern   Not on file  Social History Narrative   Not on file   Social Determinants of Health    Financial Resource Strain: Low Risk  (10/03/2022)   Overall Financial Resource Strain (CARDIA)    Difficulty of Paying Living Expenses: Not hard at all  Food Insecurity: No Food Insecurity (10/03/2022)   Hunger Vital Sign    Worried About Running Out of Food in the Last Year: Never true    Ran Out of Food in the Last Year: Never true  Transportation Needs: No Transportation Needs (10/03/2022)   PRAPARE - Administrator, Civil Service (Medical): No    Lack of Transportation (Non-Medical): No  Physical Activity: Insufficiently Active (10/03/2022)   Exercise Vital Sign    Days of Exercise per Week: 2 days    Minutes of Exercise per Session: 10 min  Stress: No Stress Concern Present (10/03/2022)  [employer] of Occupational Health - Occupational Stress Questionnaire    Feeling of Stress : Not at all  Social Connections: Not on file     Family History: The patient's family history includes Cancer in her sister; Heart disease in her brother and mother.  ROS:   Please see the history of present illness.     All other systems reviewed and are negative.  EKGs/Labs/Other Studies Reviewed:    The following studies were reviewed today: Cardiac Studies & Procedures       ECHOCARDIOGRAM  ECHOCARDIOGRAM COMPLETE 03/19/2023  Narrative ECHOCARDIOGRAM REPORT    Patient Name:   JAYDE ERWIN [facility] Date of Exam: 03/19/2023 Medical Rec #:  [id number]                    Height:       60.0 in Accession #:    [id number]                   Weight:       137.0 lb Date of Birth:  07/03/1935                    BSA:          1.589 m Patient Age:    88 years                     BP:           130/80 mmHg Patient Gender: F                            HR:           91 bpm. Exam Location:  [place]  Procedure: 2D Echo, Cardiac Doppler, Color Doppler and Strain Analysis  Indications:    Chronic diastolic heart failure (HCC) [I50.32 (ICD-10-CM)]; SOB (shortness of breath) on  exertion [R06.02 (ICD-10-CM)]  History:        Patient has prior history of Echocardiogram examinations, most recent 05/26/2021. CAD, Prior CABG, Mitral regurgitation, Arrythmias:Atrial Fibrillation, Signs/Symptoms:Edema and Chest Pain; Risk Factors:Diabetes, Dyslipidemia and Hypertension.  Sonographer:    Louie Boston RDCS Referring Phys: [phone] Flossie Dibble   Sonographer Comments: Global longitudinal strain was attempted. IMPRESSIONS   1. Left ventricular ejection fraction, by estimation, is 60 to 65%. The left ventricle has normal function. The left ventricle has no regional wall motion abnormalities. There is moderate concentric left ventricular hypertrophy. Left ventricular diastolic parameters are indeterminate. 2. Right ventricular systolic function is mildly reduced. The right ventricular size is normal. There is mildly elevated pulmonary artery systolic pressure. 3. Left atrial size was mild to moderately dilated. 4. Right atrial size was moderately dilated. 5. The mitral valve is normal in structure. Mild mitral valve regurgitation. No evidence of mitral stenosis. 6. Tricuspid valve regurgitation is moderate. 7. The aortic valve is tricuspid. Aortic valve regurgitation is mild. Aortic valve sclerosis is present, with no evidence of aortic valve stenosis. 8. Aortic Normal DTA. 9. The inferior vena cava is dilated in size with >50% respiratory variability, suggesting right atrial pressure of 8 mmHg.  FINDINGS Left Ventricle: Left ventricular ejection fraction, by estimation, is 60 to 65%. The left ventricle has normal function. The left ventricle has no regional wall motion abnormalities. The left ventricular internal cavity size was normal in size. There is moderate concentric left ventricular hypertrophy. Left ventricular diastolic parameters are indeterminate.  Right Ventricle:  The right ventricular size is normal. No increase in right ventricular wall thickness. Right  ventricular systolic function is mildly reduced. There is mildly elevated pulmonary artery systolic pressure. The tricuspid regurgitant velocity is 2.89 m/s, and with an assumed right atrial pressure of 8 mmHg, the estimated right ventricular systolic pressure is 41.4 mmHg.  Left Atrium: Left atrial size was mild to moderately dilated.  Right Atrium: Right atrial size was moderately dilated.  Pericardium: There is no evidence of pericardial effusion.  Mitral Valve: The mitral valve is normal in structure. Mild mitral annular calcification. Mild mitral valve regurgitation. No evidence of mitral valve stenosis.  Tricuspid Valve: The tricuspid valve is normal in structure. Tricuspid valve regurgitation is moderate . No evidence of tricuspid stenosis.  Aortic Valve: The aortic valve is tricuspid. Aortic valve regurgitation is mild. Aortic valve sclerosis is present, with no evidence of aortic valve stenosis.  Pulmonic Valve: The pulmonic valve was normal in structure. Pulmonic valve regurgitation is not visualized. No evidence of pulmonic stenosis.  Aorta: Normal DTA, the aortic arch was not well visualized and the aortic root and ascending aorta are structurally normal, with no evidence of dilitation.  Venous: The inferior vena cava is dilated in size with greater than 50% respiratory variability, suggesting right atrial pressure of 8 mmHg.  IAS/Shunts: No atrial level shunt detected by color flow Doppler.   LEFT VENTRICLE PLAX 2D LVIDd:         3.30 cm   Diastology LVIDs:         2.90 cm   LV e' medial:    8.59 cm/s LV PW:         1.35 cm   LV E/e' medial:  10.2 LV IVS:        1.40 cm   LV e' lateral:   11.90 cm/s LVOT diam:     2.00 cm   LV E/e' lateral: 7.4 LV SV:         34 LV SV Index:   21 LVOT Area:     3.14 cm   RIGHT VENTRICLE            IVC RV Basal diam:  3.90 cm    IVC diam: 2.10 cm RV S prime:     9.79 cm/s TAPSE (M-mode): 1.5 cm  LEFT ATRIUM             Index         RIGHT ATRIUM           Index LA diam:        4.40 cm 2.77 cm/m   RA Area:     23.60 cm LA Vol (A2C):   65.2 ml 41.02 ml/m  RA Volume:   75.50 ml  47.50 ml/m LA Vol (A4C):   59.6 ml 37.50 ml/m LA Biplane Vol: 63.8 ml 40.14 ml/m AORTIC VALVE LVOT Vmax:   57.10 cm/s LVOT Vmean:  37.200 cm/s LVOT VTI:    0.108 m  AORTA Ao Root diam: 2.85 cm Ao Asc diam:  3.50 cm Ao Desc diam: 2.10 cm  MV E velocity: 88.00 cm/s  TRICUSPID VALVE MV A velocity: 43.50 cm/s  TR Peak grad:   33.4 mmHg MV E/A ratio:  2.02        TR Vmax:        289.00 cm/s  SHUNTS Systemic VTI:  0.11 m Systemic Diam: 2.00 cm  Norman Herrlich MD Electronically signed by Norman Herrlich MD Signature Date/Time: 03/19/2023/3:27:46 PM  Final              EKG:  EKG is not ordered today.    Recent Labs: 02/27/2023: B Natriuretic Peptide 228.6 03/02/2023: NT-Pro BNP 1,283 03/19/2023: BUN 29; Creatinine, Ser 1.25; Hemoglobin 13.8; Platelets 145; Potassium 4.2; Sodium 140  Recent Lipid Panel    Component Value Date/Time   CHOL 139 05/28/2020 0835   TRIG 155 (H) 05/28/2020 0835   HDL 45 05/28/2020 0835   CHOLHDL 3.1 05/28/2020 0835   LDLCALC 67 05/28/2020 0835     Risk Assessment/Calculations:    CHA2DS2-VASc Score = 7   This indicates a 11.2% annual risk of stroke. The patient's score is based upon: CHF History: 1 HTN History: 1 Diabetes History: 1 Stroke History: 0 Vascular Disease History: 1 Age Score: 2 Gender Score: 1               Physical Exam:    VS:  BP 120/60 (BP Location: Left Arm, Patient Position: Sitting, Cuff Size: Normal)   Pulse 76   Ht 5' (1.524 m)   Wt 129 lb (58.5 kg)   SpO2 96%   BMI 25.19 kg/m     Wt Readings from Last 3 Encounters:  04/03/23 129 lb (58.5 kg)  03/02/23 137 lb (62.1 kg)  02/27/23 135 lb (61.2 kg)     GEN:  Well nourished, well developed in no acute distress HEENT: Normal NECK: No JVD; No carotid bruits LYMPHATICS: No lymphadenopathy CARDIAC:  irregularly irregular, no murmurs, rubs, gallops RESPIRATORY: no rales, no wheezing or rhonchi  ABDOMEN: Soft, non-tender, non-distended MUSCULOSKELETAL: trace pedal edema,  No deformity  SKIN: Warm and dry NEUROLOGIC:  Alert and oriented x 3 PSYCHIATRIC:  Normal affect   ASSESSMENT:    1. Chronic atrial fibrillation (HCC)   2. Chronic anticoagulation   3. Chronic diastolic heart failure (HCC)   4. Essential hypertension   5. Coronary artery disease involving native coronary artery of native heart without angina pectoris   6. Dyslipidemia   7. Idiopathic thrombocytopenia (HCC)    PLAN:    In order of problems listed above:  Chronic diastolic heart failure - most recent EF 60-65%, NYHA class I today.  Continue metoprolol 50 mg daily, continue Entresto 24-26 twice daily, continue Jardiance 10 mg daily, continue nitroglycerin as needed--has not needed. Chronic AF/hypercoagulable state/ITP -CHA2DS2-VASc score of 7.  HR 76 bpm, rate controlled atrial fibrillation. Not currently on DOAC secondary to GI bleed, and ITP. Continue metoprolol succinate 50 mg daily.  Will discuss with primary cardiology if she should resume DOAC.  History of GI bleed in idiopathic thrombocytopenia, was followed by hematology however now they have advised her she can follow-up with her PCP.  Most recent hemoglobin was 13.8 hematocrit 41.8, platelets 145 on 03/19/2023. CAD - s/p CABG x 6 2017. Stable with no anginal symptoms. No indication for ischemic evaluation.  Continue Zocor 10 mg daily Dyslipidemia-most recent LDL was 67, continue Zocor 10 mg daily.    Disposition-return in 3 months.          Medication Adjustments/Labs and Tests Ordered: Current medicines are reviewed at length with the patient today.  Concerns regarding medicines are outlined above.  No orders of the defined types were placed in this encounter.  No orders of the defined types were placed in this encounter.   Patient Instructions   Medication Instructions:  Your physician recommends that you continue on your current medications as directed. Please refer to the Current Medication  list given to you today.  *If you need a refill on your cardiac medications before your next appointment, please call your pharmacy*   Lab Work: None Ordered If you have labs (blood work) drawn today and your tests are completely normal, you will receive your results only by: MyChart Message (if you have MyChart) OR A paper copy in the mail If you have any lab test that is abnormal or we need to change your treatment, we will call you to review the results.   Testing/Procedures: None Ordered   Follow-Up: At [facility], you and your health needs are our priority.  As part of our continuing mission to provide you with exceptional heart care, we have created designated Provider Care Teams.  These Care Teams include your primary Cardiologist (physician) and Advanced Practice Providers (APPs -  Physician Assistants and Nurse Practitioners) who all work together to provide you with the care you need, when you need it.  We recommend signing up for the patient portal called "MyChart".  Sign up information is provided on this After Visit Summary.  MyChart is used to connect with patients for Virtual Visits (Telemedicine).  Patients are able to view lab/test results, encounter notes, upcoming appointments, etc.  Non-urgent messages can be sent to your provider as well.   To learn more about what you can do with MyChart, go to [email or web address].    Your next appointment:   3 month(s) with Dr. Dulce Sellar  The format for your next appointment:   In Person  Provider:   Wallis Bamberg, NP    Other Instructions NA    Signed, Flossie Dibble, NP  04/03/2023 9:03 AM    [facility]

## 2023-04-04 ENCOUNTER — Other Ambulatory Visit: Payer: Self-pay | Admitting: Cardiology

## 2023-04-04 NOTE — Telephone Encounter (Signed)
Rx sent to pharmacy   

## 2023-04-17 DIAGNOSIS — I482 Chronic atrial fibrillation, unspecified: Secondary | ICD-10-CM | POA: Diagnosis not present

## 2023-04-18 ENCOUNTER — Telehealth: Payer: Self-pay

## 2023-04-18 LAB — CBC
Hematocrit: 38.9 % (ref 34.0–46.6)
Hemoglobin: 12.5 g/dL (ref 11.1–15.9)
MCH: 28 pg (ref 26.6–33.0)
MCHC: 32.1 g/dL (ref 31.5–35.7)
MCV: 87 fL (ref 79–97)
Platelets: 151 x10E3/uL (ref 150–450)
RBC: 4.46 x10E6/uL (ref 3.77–5.28)
RDW: 14.5 % (ref 11.7–15.4)
WBC: 7.1 x10E3/uL (ref 3.4–10.8)

## 2023-04-18 NOTE — Telephone Encounter (Signed)
-----   Message from Flossie Dibble, NP sent at 04/18/2023  2:38 PM EDT ----- Repeat blood work looks great, normal!

## 2023-04-18 NOTE — Telephone Encounter (Signed)
Repeat blood work looks great, normal!

## 2023-05-01 DIAGNOSIS — I10 Essential (primary) hypertension: Secondary | ICD-10-CM | POA: Diagnosis not present

## 2023-05-01 DIAGNOSIS — Z7901 Long term (current) use of anticoagulants: Secondary | ICD-10-CM | POA: Diagnosis not present

## 2023-05-01 DIAGNOSIS — I110 Hypertensive heart disease with heart failure: Secondary | ICD-10-CM | POA: Diagnosis not present

## 2023-05-01 DIAGNOSIS — I5032 Chronic diastolic (congestive) heart failure: Secondary | ICD-10-CM | POA: Diagnosis not present

## 2023-05-01 DIAGNOSIS — I482 Chronic atrial fibrillation, unspecified: Secondary | ICD-10-CM | POA: Diagnosis not present

## 2023-05-02 ENCOUNTER — Telehealth: Payer: Self-pay

## 2023-05-02 DIAGNOSIS — I5032 Chronic diastolic (congestive) heart failure: Secondary | ICD-10-CM

## 2023-05-02 LAB — CBC WITH DIFFERENTIAL/PLATELET
Basophils Absolute: 0 x10E3/uL (ref 0.0–0.2)
Basos: 1 %
EOS (ABSOLUTE): 0.3 x10E3/uL (ref 0.0–0.4)
Eos: 5 %
Hematocrit: 40.5 % (ref 34.0–46.6)
Hemoglobin: 13.2 g/dL (ref 11.1–15.9)
Immature Grans (Abs): 0 x10E3/uL (ref 0.0–0.1)
Immature Granulocytes: 0 %
Lymphocytes Absolute: 1.3 x10E3/uL (ref 0.7–3.1)
Lymphs: 22 %
MCH: 28.6 pg (ref 26.6–33.0)
MCHC: 32.6 g/dL (ref 31.5–35.7)
MCV: 88 fL (ref 79–97)
Monocytes Absolute: 0.3 x10E3/uL (ref 0.1–0.9)
Monocytes: 6 %
Neutrophils Absolute: 3.8 x10E3/uL (ref 1.4–7.0)
Neutrophils: 66 %
Platelets: 166 x10E3/uL (ref 150–450)
RBC: 4.61 x10E6/uL (ref 3.77–5.28)
RDW: 15.3 % (ref 11.7–15.4)
WBC: 5.8 x10E3/uL (ref 3.4–10.8)

## 2023-05-02 LAB — BASIC METABOLIC PANEL WITH GFR
BUN/Creatinine Ratio: 20 (ref 12–28)
BUN: 31 mg/dL — ABNORMAL HIGH (ref 8–27)
CO2: 30 mmol/L — ABNORMAL HIGH (ref 20–29)
Calcium: 10.2 mg/dL (ref 8.7–10.3)
Chloride: 98 mmol/L (ref 96–106)
Creatinine, Ser: 1.54 mg/dL — ABNORMAL HIGH (ref 0.57–1.00)
Glucose: 207 mg/dL — ABNORMAL HIGH (ref 70–99)
Potassium: 4.6 mmol/L (ref 3.5–5.2)
Sodium: 141 mmol/L (ref 134–144)
eGFR: 33 mL/min/1.73 — ABNORMAL LOW

## 2023-05-02 MED ORDER — TORSEMIDE 20 MG PO TABS: 20.0000 mg | ORAL_TABLET | Freq: Every day | ORAL | Status: DC

## 2023-05-02 NOTE — Telephone Encounter (Signed)
-----   Message from Flossie Dibble, NP sent at 05/02/2023  8:10 AM EDT ----- Ms. Crutcher, Your lab work shows your kidneys may be working too hard with the fluid pill. Lets change your fluid pill to 20 mg once daily. Weigh yourself daily, if you gain more than 3 lbs in one day, you can take an additional fluid pill on the day your weight it up.  Continue to make sure you are drinking enough water, but not too much (64 ounces of total fluid/day).  Repeat BMET in 2 weeks.

## 2023-05-15 DIAGNOSIS — I5032 Chronic diastolic (congestive) heart failure: Secondary | ICD-10-CM | POA: Diagnosis not present

## 2023-05-16 ENCOUNTER — Telehealth: Payer: Self-pay | Admitting: Cardiology

## 2023-05-16 LAB — BASIC METABOLIC PANEL WITH GFR
BUN/Creatinine Ratio: 20 (ref 12–28)
BUN: 28 mg/dL — ABNORMAL HIGH (ref 8–27)
CO2: 27 mmol/L (ref 20–29)
Calcium: 9.7 mg/dL (ref 8.7–10.3)
Chloride: 101 mmol/L (ref 96–106)
Creatinine, Ser: 1.41 mg/dL — ABNORMAL HIGH (ref 0.57–1.00)
Glucose: 136 mg/dL — ABNORMAL HIGH (ref 70–99)
Potassium: 4.2 mmol/L (ref 3.5–5.2)
Sodium: 142 mmol/L (ref 134–144)
eGFR: 36 mL/min/1.73 — ABNORMAL LOW

## 2023-05-16 MED ORDER — POTASSIUM CHLORIDE CRYS ER 20 MEQ PO TBCR: 20.0000 meq | EXTENDED_RELEASE_TABLET | Freq: Two times a day (BID) | ORAL | 1 refills | Status: DC

## 2023-05-16 NOTE — Telephone Encounter (Signed)
*  STAT* If patient is at the pharmacy, call can be transferred to refill team.   1. Which medications need to be refilled? (please list name of each medication and dose if known)   potassium chloride SA (KLOR-CON M) 20 MEQ tablet    2. Which pharmacy/location (including street and city if local pharmacy) is medication to be sent to? [facility] - [place], [place] - [address]    3. Do they need a 30 day or 90 day supply? 90 days

## 2023-05-30 ENCOUNTER — Other Ambulatory Visit: Payer: Self-pay | Admitting: Cardiology

## 2023-06-18 DIAGNOSIS — E119 Type 2 diabetes mellitus without complications: Secondary | ICD-10-CM | POA: Diagnosis not present

## 2023-06-18 DIAGNOSIS — E7800 Pure hypercholesterolemia, unspecified: Secondary | ICD-10-CM | POA: Diagnosis not present

## 2023-06-18 DIAGNOSIS — E039 Hypothyroidism, unspecified: Secondary | ICD-10-CM | POA: Diagnosis not present

## 2023-06-20 DIAGNOSIS — E119 Type 2 diabetes mellitus without complications: Secondary | ICD-10-CM | POA: Diagnosis not present

## 2023-06-20 DIAGNOSIS — Z1339 Encounter for screening examination for other mental health and behavioral disorders: Secondary | ICD-10-CM | POA: Diagnosis not present

## 2023-06-20 DIAGNOSIS — Z0000 Encounter for general adult medical examination without abnormal findings: Secondary | ICD-10-CM | POA: Diagnosis not present

## 2023-06-20 DIAGNOSIS — Z6825 Body mass index (BMI) 25.0-25.9, adult: Secondary | ICD-10-CM | POA: Diagnosis not present

## 2023-06-26 ENCOUNTER — Other Ambulatory Visit: Payer: Self-pay

## 2023-06-26 MED ORDER — POTASSIUM CHLORIDE CRYS ER 20 MEQ PO TBCR: 20.0000 meq | EXTENDED_RELEASE_TABLET | Freq: Two times a day (BID) | ORAL | 2 refills | Status: DC

## 2023-07-16 NOTE — Progress Notes (Unsigned)
Cardiology Office Note:    Date:  07/17/2023   ID:  Orrin Brigham [place], [place] Apr 03, 1935, MRN [id number]  PCP:  Marylen Ponto, MD  Cardiologist:  Norman Herrlich, MD    Referring MD: Marylen Ponto, MD    ASSESSMENT:    1. Chronic diastolic heart failure (HCC)   2. Chronic atrial fibrillation (HCC)   3. Chronic anticoagulation   4. Coronary artery disease involving native coronary artery of native heart without angina pectoris   5. Essential hypertension   6. Dyslipidemia    PLAN:    In order of problems listed above:  She is markedly improved her heart failure is compensated she has had a good objective and subjective response to SGLT2 inhibitor and will continue her guideline directed treatment and we will cautiously reintroduce ARB and if tolerated transition to [facility] next visit Labs are being followed at the cancer center monthly Rate control with her beta-blocker and continue reduced dose anticoagulant Stable after remote CABG having no anginal discomfort Stable restart low-dose ARB Continue her statin   Next appointment: 3 months   Medication Adjustments/Labs and Tests Ordered: Current medicines are reviewed at length with the patient today.  Concerns regarding medicines are outlined above.  Orders Placed This Encounter  Procedures   Basic Metabolic Panel (BMET)   Pro b natriuretic peptide (BNP)   Uric acid   Meds ordered this encounter  Medications   torsemide (DEMADEX) 20 MG tablet    Sig: Take 1 tablet (20 mg total) by mouth 2 (two) times daily. Only take Torsemide daily if patient's weight is 127 or less.    Dispense:  180 tablet    Refill:  3   valsartan (DIOVAN) 40 MG tablet    Sig: Take 1 tablet (40 mg total) by mouth 2 (two) times daily. (Take this medication once daily for 2 weeks, then twice daily)    Dispense:  180 tablet    Refill:  3   allopurinol (ZYLOPRIM) 100 MG tablet    Sig: Take 1 tablet (100 mg total) by mouth daily.     Dispense:  90 tablet    Refill:  3     History of Present Illness:    Doloras Berto is a 88 y.o. female with a hx of flex heart disease including CAD with remote CABG hypertensive heart disease or chronic diastolic heart failure chronic atrial fibrillation with anticoagulation and hyperlipidemia last seen by me 12/06/2022 when she had bruising rectal bleeding was found to have profound thrombocytopenia with a platelet count of 12,000.  She has been seen most recently by Wallis Bamberg, NP.  At her last visit 04/03/2019 for heart failure was compensated she was in chronic A-fib rate controlled and was not anticoagulated at that time with her GI bleeding and thrombocytopenia.   Compliance with diet, lifestyle and medications: Yes  She is quite improved overall Stable platelet count back on her low-dose anticoagulant and no clinical bleeding Since the addition of SGLT2 inhibitor her weight is down 10 pounds and her heart failure is nicely compensated To avoid overdiuresis she will hold her second dose of diuretic in a day her weight is 127 or less She inquires about restarting Entresto, for safety I will place her on low-dose valsartan and if tolerated next visit we can transition to Entresto to optimize her heart failure treatment She is not having edema shortness of breath chest pain palpitation or syncope Past Medical History:  Diagnosis Date  Breast cancer (HCC) 10/03/2021   CAD in native artery    Chronic atrial fibrillation (HCC) 10/03/2016   Chronic diastolic heart failure (HCC) 11/06/2016   Coronary artery disease involving native coronary artery of native heart without angina pectoris 07/28/2015   Diabetes mellitus due to underlying condition with unspecified complications (HCC) 07/28/2015   Dyslipidemia    Essential hypertension    GIB (gastrointestinal bleeding) 12/08/2022   Hx of CABG 10/03/2016   Overview:  2006   Hypertensive heart disease with heart failure  (HCC) 07/28/2015   Hyponatremia with decreased serum osmolality 07/31/2017   Mitral regurgitation 02/10/2018   moderate   Thrombocytopenia (HCC) 12/08/2022   Type 2 diabetes mellitus (HCC)     Current Medications: Current Meds  Medication Sig   apixaban (ELIQUIS) 2.5 MG TABS tablet Take 1 tablet (2.5 mg total) by mouth 2 (two) times daily.   colchicine 0.6 MG tablet Take 1 tablet (0.6 mg total) by mouth daily. Please take as needed for three days for gout flare up   famotidine (PEPCID) 20 MG tablet Take 1 tablet (20 mg total) by mouth daily.   JARDIANCE 10 MG TABS tablet Take 10 mg by mouth daily.   levothyroxine (SYNTHROID) 75 MCG tablet Take 75 mcg by mouth daily.    loratadine (CLARITIN) 10 MG tablet Take 10 mg by mouth daily.   metoprolol succinate (TOPROL-XL) 50 MG 24 hr tablet Take 50 mg by mouth daily.   Multiple Vitamin (MULTIVITAMIN) capsule Take 1 capsule by mouth daily.    nitroGLYCERIN (NITROSTAT) 0.4 MG SL tablet Place 1 tablet (0.4 mg total) under the tongue every 5 (five) minutes as needed for chest pain.   potassium chloride SA (KLOR-CON M) 20 MEQ tablet Take 1 tablet (20 mEq total) by mouth 2 (two) times daily.   simvastatin (ZOCOR) 10 MG tablet Take 10 mg by mouth daily.   torsemide (DEMADEX) 20 MG tablet Take 1 tablet (20 mg total) by mouth 2 (two) times daily. Only take Torsemide daily if patient's weight is 127 or less.   valsartan (DIOVAN) 40 MG tablet Take 1 tablet (40 mg total) by mouth 2 (two) times daily. (Take this medication once daily for 2 weeks, then twice daily)   Vitamin D, Ergocalciferol, (DRISDOL) 50000 units CAPS capsule Take 50,000 Units by mouth every 14 (fourteen) days.   [DISCONTINUED] allopurinol (ZYLOPRIM) 100 MG tablet Take 1 tablet (100 mg total) by mouth daily.   [DISCONTINUED] torsemide (DEMADEX) 20 MG tablet Take 1 tablet (20 mg total) by mouth daily. And 20 mg in the evening      EKGs/Labs/Other Studies Reviewed:    The following studies  were reviewed today:  Cardiac Studies & Procedures       ECHOCARDIOGRAM  ECHOCARDIOGRAM COMPLETE 03/19/2023  Narrative ECHOCARDIOGRAM REPORT    Patient Name:   CHALICE LEYDIG [facility] Date of Exam: 03/19/2023 Medical Rec #:  [id number]                    Height:       60.0 in Accession #:    [id number]                   Weight:       137.0 lb Date of Birth:  [birth year]                    BSA:          1.589 m Patient Age:  88 years                     BP:           130/80 mmHg Patient Gender: F                            HR:           91 bpm. Exam Location:  [facility]  Procedure: 2D Echo, Cardiac Doppler, Color Doppler and Strain Analysis  Indications:    Chronic diastolic heart failure (HCC) [I50.32 (ICD-10-CM)]; SOB (shortness of breath) on exertion [R06.02 (ICD-10-CM)]  History:        Patient has prior history of Echocardiogram examinations, most recent 05/26/2021. CAD, Prior CABG, Mitral regurgitation, Arrythmias:Atrial Fibrillation, Signs/Symptoms:Edema and Chest Pain; Risk Factors:Diabetes, Dyslipidemia and Hypertension.  Sonographer:    Louie Boston RDCS Referring Phys: [phone] Flossie Dibble   Sonographer Comments: Global longitudinal strain was attempted. IMPRESSIONS   1. Left ventricular ejection fraction, by estimation, is 60 to 65%. The left ventricle has normal function. The left ventricle has no regional wall motion abnormalities. There is moderate concentric left ventricular hypertrophy. Left ventricular diastolic parameters are indeterminate. 2. Right ventricular systolic function is mildly reduced. The right ventricular size is normal. There is mildly elevated pulmonary artery systolic pressure. 3. Left atrial size was mild to moderately dilated. 4. Right atrial size was moderately dilated. 5. The mitral valve is normal in structure. Mild mitral valve regurgitation. No evidence of mitral stenosis. 6. Tricuspid valve regurgitation is moderate. 7. The  aortic valve is tricuspid. Aortic valve regurgitation is mild. Aortic valve sclerosis is present, with no evidence of aortic valve stenosis. 8. Aortic Normal DTA. 9. The inferior vena cava is dilated in size with >50% respiratory variability, suggesting right atrial pressure of 8 mmHg.  FINDINGS Left Ventricle: Left ventricular ejection fraction, by estimation, is 60 to 65%. The left ventricle has normal function. The left ventricle has no regional wall motion abnormalities. The left ventricular internal cavity size was normal in size. There is moderate concentric left ventricular hypertrophy. Left ventricular diastolic parameters are indeterminate.  Right Ventricle: The right ventricular size is normal. No increase in right ventricular wall thickness. Right ventricular systolic function is mildly reduced. There is mildly elevated pulmonary artery systolic pressure. The tricuspid regurgitant velocity is 2.89 m/s, and with an assumed right atrial pressure of 8 mmHg, the estimated right ventricular systolic pressure is 41.4 mmHg.  Left Atrium: Left atrial size was mild to moderately dilated.  Right Atrium: Right atrial size was moderately dilated.  Pericardium: There is no evidence of pericardial effusion.  Mitral Valve: The mitral valve is normal in structure. Mild mitral annular calcification. Mild mitral valve regurgitation. No evidence of mitral valve stenosis.  Tricuspid Valve: The tricuspid valve is normal in structure. Tricuspid valve regurgitation is moderate . No evidence of tricuspid stenosis.  Aortic Valve: The aortic valve is tricuspid. Aortic valve regurgitation is mild. Aortic valve sclerosis is present, with no evidence of aortic valve stenosis.  Pulmonic Valve: The pulmonic valve was normal in structure. Pulmonic valve regurgitation is not visualized. No evidence of pulmonic stenosis.  Aorta: Normal DTA, the aortic arch was not well visualized and the aortic root and ascending  aorta are structurally normal, with no evidence of dilitation.  Venous: The inferior vena cava is dilated in size with greater than 50% respiratory variability, suggesting right atrial pressure of 8  mmHg.  IAS/Shunts: No atrial level shunt detected by color flow Doppler.   LEFT VENTRICLE PLAX 2D LVIDd:         3.30 cm   Diastology LVIDs:         2.90 cm   LV e' medial:    8.59 cm/s LV PW:         1.35 cm   LV E/e' medial:  10.2 LV IVS:        1.40 cm   LV e' lateral:   11.90 cm/s LVOT diam:     2.00 cm   LV E/e' lateral: 7.4 LV SV:         34 LV SV Index:   21 LVOT Area:     3.14 cm   RIGHT VENTRICLE            IVC RV Basal diam:  3.90 cm    IVC diam: 2.10 cm RV S prime:     9.79 cm/s TAPSE (M-mode): 1.5 cm  LEFT ATRIUM             Index        RIGHT ATRIUM           Index LA diam:        4.40 cm 2.77 cm/m   RA Area:     23.60 cm LA Vol (A2C):   65.2 ml 41.02 ml/m  RA Volume:   75.50 ml  47.50 ml/m LA Vol (A4C):   59.6 ml 37.50 ml/m LA Biplane Vol: 63.8 ml 40.14 ml/m AORTIC VALVE LVOT Vmax:   57.10 cm/s LVOT Vmean:  37.200 cm/s LVOT VTI:    0.108 m  AORTA Ao Root diam: 2.85 cm Ao Asc diam:  3.50 cm Ao Desc diam: 2.10 cm  MV E velocity: 88.00 cm/s  TRICUSPID VALVE MV A velocity: 43.50 cm/s  TR Peak grad:   33.4 mmHg MV E/A ratio:  2.02        TR Vmax:        289.00 cm/s  SHUNTS Systemic VTI:  0.11 m Systemic Diam: 2.00 cm  Norman Herrlich MD Electronically signed by Norman Herrlich MD Signature Date/Time: 03/19/2023/3:27:46 PM    Final                 Recent Labs: 02/27/2023: B Natriuretic Peptide 228.6 03/02/2023: NT-Pro BNP 1,283 05/01/2023: Hemoglobin 13.2; Platelets 166 05/15/2023: BUN 28; Creatinine, Ser 1.41; Potassium 4.2; Sodium 142  Recent Lipid Panel    Component Value Date/Time   CHOL 139 05/28/2020 0835   TRIG 155 (H) 05/28/2020 0835   HDL 45 05/28/2020 0835   CHOLHDL 3.1 05/28/2020 0835   LDLCALC 67 05/28/2020 0835    Physical Exam:     VS:  BP 112/80 (BP Location: Right Arm, Patient Position: Sitting, Cuff Size: Normal)   Pulse 67   Ht 5' (1.524 m)   Wt 128 lb 3.2 oz (58.2 kg)   SpO2 93%   BMI 25.04 kg/m     Wt Readings from Last 3 Encounters:  07/17/23 128 lb 3.2 oz (58.2 kg)  04/03/23 129 lb (58.5 kg)  03/02/23 137 lb (62.1 kg)     GEN: Still looks frail well nourished, well developed in no acute distress HEENT: Normal NECK: No JVD; No carotid bruits LYMPHATICS: No lymphadenopathy CARDIAC: Irregular rate and rhythm variable first heart sound no S3  RESPIRATORY:  Clear to auscultation without rales, wheezing or rhonchi  ABDOMEN: Soft, non-tender, non-distended MUSCULOSKELETAL:  No  edema; No deformity  SKIN: Warm and dry NEUROLOGIC:  Alert and oriented x 3 PSYCHIATRIC:  Normal affect    Signed, Norman Herrlich, MD  07/17/2023 11:45 AM    [facility]

## 2023-07-17 ENCOUNTER — Ambulatory Visit: Payer: Medicare Other | Attending: Cardiology | Admitting: Cardiology

## 2023-07-17 ENCOUNTER — Encounter: Payer: Self-pay | Admitting: Cardiology

## 2023-07-17 VITALS — BP 112/80 | HR 67 | Ht 60.0 in | Wt 128.2 lb

## 2023-07-17 DIAGNOSIS — E785 Hyperlipidemia, unspecified: Secondary | ICD-10-CM | POA: Insufficient documentation

## 2023-07-17 DIAGNOSIS — Z7901 Long term (current) use of anticoagulants: Secondary | ICD-10-CM | POA: Insufficient documentation

## 2023-07-17 DIAGNOSIS — I10 Essential (primary) hypertension: Secondary | ICD-10-CM | POA: Insufficient documentation

## 2023-07-17 DIAGNOSIS — I5032 Chronic diastolic (congestive) heart failure: Secondary | ICD-10-CM | POA: Diagnosis not present

## 2023-07-17 DIAGNOSIS — I482 Chronic atrial fibrillation, unspecified: Secondary | ICD-10-CM | POA: Insufficient documentation

## 2023-07-17 DIAGNOSIS — I2510 Atherosclerotic heart disease of native coronary artery without angina pectoris: Secondary | ICD-10-CM | POA: Diagnosis not present

## 2023-07-17 MED ORDER — TORSEMIDE 20 MG PO TABS: 20.0000 mg | ORAL_TABLET | Freq: Two times a day (BID) | ORAL | 3 refills | Status: DC

## 2023-07-17 MED ORDER — ALLOPURINOL 100 MG PO TABS: 100.0000 mg | ORAL_TABLET | Freq: Every day | ORAL | 3 refills | Status: AC

## 2023-07-17 MED ORDER — VALSARTAN 40 MG PO TABS: 40.0000 mg | ORAL_TABLET | Freq: Two times a day (BID) | ORAL | 3 refills | Status: DC

## 2023-07-17 NOTE — Patient Instructions (Signed)
Medication Instructions:  Your physician has recommended you make the following change in your medication:   START: Torsemide 20 mg twice daily (Take this medication daily if weight is 127 lbs. or less) START: Valsartan 40 mg twice daily (Take this medication once daily for 2 weeks then start twice daily) START: Allopurinol 100 mg daily  *If you need a refill on your cardiac medications before your next appointment, please call your pharmacy*   Lab Work: Your physician recommends that you return for lab work in:   Labs today: BMP, Pro BNP, Uric acid  If you have labs (blood work) drawn today and your tests are completely normal, you will receive your results only by: MyChart Message (if you have MyChart) OR A paper copy in the mail If you have any lab test that is abnormal or we need to change your treatment, we will call you to review the results.   Testing/Procedures: None   Follow-Up: At [facility], you and your health needs are our priority.  As part of our continuing mission to provide you with exceptional heart care, we have created designated Provider Care Teams.  These Care Teams include your primary Cardiologist (physician) and Advanced Practice Providers (APPs -  Physician Assistants and Nurse Practitioners) who all work together to provide you with the care you need, when you need it.  We recommend signing up for the patient portal called "MyChart".  Sign up information is provided on this After Visit Summary.  MyChart is used to connect with patients for Virtual Visits (Telemedicine).  Patients are able to view lab/test results, encounter notes, upcoming appointments, etc.  Non-urgent messages can be sent to your provider as well.   To learn more about what you can do with MyChart, go to [email or web address].    Your next appointment:   4 week(s)  Provider:   Norman Herrlich, MD    Other Instructions None

## 2023-08-15 DIAGNOSIS — Z23 Encounter for immunization: Secondary | ICD-10-CM | POA: Diagnosis not present

## 2023-08-19 NOTE — Progress Notes (Unsigned)
up   famotidine (PEPCID) 20 MG tablet Take 1 tablet (20 mg total) by mouth daily. (Patient taking differently: Take 20 mg by mouth daily.)   JARDIANCE 10 MG TABS tablet Take 10 mg by mouth daily.   levothyroxine (SYNTHROID) 75 MCG tablet Take 75 mcg by mouth daily.    loratadine (CLARITIN) 10 MG tablet Take 10 mg by  mouth daily.   metoprolol succinate (TOPROL-XL) 50 MG 24 hr tablet Take 50 mg by mouth daily.   Multiple Vitamin (MULTIVITAMIN) capsule Take 1 capsule by mouth daily.    nitroGLYCERIN (NITROSTAT) 0.4 MG SL tablet Place 1 tablet (0.4 mg total) under the tongue every 5 (five) minutes as needed for chest pain.   potassium chloride SA (KLOR-CON M) 20 MEQ tablet Take 1 tablet (20 mEq total) by mouth 2 (two) times daily.   simvastatin (ZOCOR) 10 MG tablet Take 10 mg by mouth daily.   torsemide (DEMADEX) 20 MG tablet Take 1 tablet (20 mg total) by mouth 2 (two) times daily. Only take Torsemide daily if patient's weight is 127 or less.   valsartan (DIOVAN) 40 MG tablet Take 1 tablet (40 mg total) by mouth 2 (two) times daily. (Take this medication once daily for 2 weeks, then twice daily)   Vitamin D, Ergocalciferol, (DRISDOL) 50000 units CAPS capsule Take 50,000 Units by mouth every 14 (fourteen) days.      EKGs/Labs/Other Studies Reviewed:    The following studies were reviewed today:  Cardiac Studies & Procedures       ECHOCARDIOGRAM  ECHOCARDIOGRAM COMPLETE 03/19/2023  Narrative ECHOCARDIOGRAM REPORT    Patient Name:   Angel Gentry [facility] Date of Exam: 03/19/2023 Medical Rec #:  [id number]                    Height:       60.0 in Accession #:    [id number]                   Weight:       137.0 lb Date of Birth:  1935-03-12                    BSA:          1.589 m Patient Age:    88 years                     BP:           130/80 mmHg Patient Gender: F                            HR:           91 bpm. Exam Location:  [place]  Procedure: 2D Echo, Cardiac Doppler, Color Doppler and Strain Analysis  Indications:    Chronic diastolic heart failure (HCC) [I50.32 (ICD-10-CM)]; SOB (shortness of breath) on exertion [R06.02 (ICD-10-CM)]  History:        Patient has prior history of Echocardiogram examinations, most recent 05/26/2021. CAD, Prior CABG, Mitral  regurgitation, Arrythmias:Atrial Fibrillation, Signs/Symptoms:Edema and Chest Pain; Risk Factors:Diabetes, Dyslipidemia and Hypertension.  Sonographer:    Angel Gentry RDCS Referring Phys: [phone] Angel Gentry   Sonographer Comments: Global longitudinal strain was attempted. IMPRESSIONS   1. Left ventricular ejection fraction, by estimation, is 60 to 65%. The left ventricle has normal function. The left ventricle has no regional wall motion abnormalities. There is moderate concentric  up   famotidine (PEPCID) 20 MG tablet Take 1 tablet (20 mg total) by mouth daily. (Patient taking differently: Take 20 mg by mouth daily.)   JARDIANCE 10 MG TABS tablet Take 10 mg by mouth daily.   levothyroxine (SYNTHROID) 75 MCG tablet Take 75 mcg by mouth daily.    loratadine (CLARITIN) 10 MG tablet Take 10 mg by  mouth daily.   metoprolol succinate (TOPROL-XL) 50 MG 24 hr tablet Take 50 mg by mouth daily.   Multiple Vitamin (MULTIVITAMIN) capsule Take 1 capsule by mouth daily.    nitroGLYCERIN (NITROSTAT) 0.4 MG SL tablet Place 1 tablet (0.4 mg total) under the tongue every 5 (five) minutes as needed for chest pain.   potassium chloride SA (KLOR-CON M) 20 MEQ tablet Take 1 tablet (20 mEq total) by mouth 2 (two) times daily.   simvastatin (ZOCOR) 10 MG tablet Take 10 mg by mouth daily.   torsemide (DEMADEX) 20 MG tablet Take 1 tablet (20 mg total) by mouth 2 (two) times daily. Only take Torsemide daily if patient's weight is 127 or less.   valsartan (DIOVAN) 40 MG tablet Take 1 tablet (40 mg total) by mouth 2 (two) times daily. (Take this medication once daily for 2 weeks, then twice daily)   Vitamin D, Ergocalciferol, (DRISDOL) 50000 units CAPS capsule Take 50,000 Units by mouth every 14 (fourteen) days.      EKGs/Labs/Other Studies Reviewed:    The following studies were reviewed today:  Cardiac Studies & Procedures       ECHOCARDIOGRAM  ECHOCARDIOGRAM COMPLETE 03/19/2023  Narrative ECHOCARDIOGRAM REPORT    Patient Name:   Angel Gentry [facility] Date of Exam: 03/19/2023 Medical Rec #:  [id number]                    Height:       60.0 in Accession #:    [id number]                   Weight:       137.0 lb Date of Birth:  1935-03-12                    BSA:          1.589 m Patient Age:    88 years                     BP:           130/80 mmHg Patient Gender: F                            HR:           91 bpm. Exam Location:  [place]  Procedure: 2D Echo, Cardiac Doppler, Color Doppler and Strain Analysis  Indications:    Chronic diastolic heart failure (HCC) [I50.32 (ICD-10-CM)]; SOB (shortness of breath) on exertion [R06.02 (ICD-10-CM)]  History:        Patient has prior history of Echocardiogram examinations, most recent 05/26/2021. CAD, Prior CABG, Mitral  regurgitation, Arrythmias:Atrial Fibrillation, Signs/Symptoms:Edema and Chest Pain; Risk Factors:Diabetes, Dyslipidemia and Hypertension.  Sonographer:    Angel Gentry RDCS Referring Phys: [phone] Angel Gentry   Sonographer Comments: Global longitudinal strain was attempted. IMPRESSIONS   1. Left ventricular ejection fraction, by estimation, is 60 to 65%. The left ventricle has normal function. The left ventricle has no regional wall motion abnormalities. There is moderate concentric  Cardiology Office Note:    Date:  08/21/2023   ID:  Angel Gentry, [name] [birth year], MRN [id number]  PCP:  Angel Ponto, MD  Cardiologist:  Angel Herrlich, MD    Referring MD: Angel Ponto, MD    ASSESSMENT:    1. Chronic diastolic heart failure (HCC)   2. Chronic anticoagulation   3. Coronary artery disease involving native coronary artery of native heart without angina pectoris   4. Dyslipidemia   5. Hypertensive heart disease with heart failure (HCC)    PLAN:    In order of problems listed above:  She clearly has improved heart failure is compensated stable CAD without anginal discomfort rate controlled atrial fibrillation with her beta-blocker and tolerating reduced dose anticoagulant without recurrent thrombocytopenia or GI bleeding. No change in her medications lipids are at target Heart failure is compensated with her current loop diuretic weight at home is stable For safety recheck a CBC BMP proBNP   Next appointment: 6 months   Medication Adjustments/Labs and Tests Ordered: Current medicines are reviewed at length with the patient today.  Concerns regarding medicines are outlined above.  No orders of the defined types were placed in this encounter.  No orders of the defined types were placed in this encounter.    History of Present Illness:    Aerilynn Gabb is a 88 y.o. female with a hx of complex heart disease including CAD with remote CABG hypertensive heart disease or chronic diastolic heart failure chronic atrial fibrillation with anticoagulation hyperlipidemia thrombocytopenia with GI bleeding that is resolved last seen 07/17/2023.  At that visit a proBNP level was stable creatinine 0.99 GFR 56 cc/min.  Initiated on low-dose valsartan with the plan that if tolerated to initiate Entresto at follow-up visit continue her loop diuretic beta-blocker SGLT2 inhibitor and reduced dose anticoagulant.  Compliance with diet, lifestyle and  medications: Yes  Her husband is present participates in evaluation decision making Think she has finally recovered to her previous level of function is having no hypotension edema shortness of breath chest pain palpitation or syncope She is back on her anticoagulant no bleeding will recheck a platelet count today along with a CBC and renal function She tolerates her statin without muscle pain or weakness her recent labs and August had a cholesterol 173 LDL 67 Past Medical History:  Diagnosis Date   Breast cancer (HCC) 10/03/2021   CAD in native artery    Chronic atrial fibrillation (HCC) 10/03/2016   Chronic diastolic heart failure (HCC) 11/06/2016   Coronary artery disease involving native coronary artery of native heart without angina pectoris 07/28/2015   Diabetes mellitus due to underlying condition with unspecified complications (HCC) 07/28/2015   Dyslipidemia    Essential hypertension    GIB (gastrointestinal bleeding) 12/08/2022   Hx of CABG 10/03/2016   Overview:  2006   Hypertensive heart disease with heart failure (HCC) 07/28/2015   Hyponatremia with decreased serum osmolality 07/31/2017   Mitral regurgitation 02/10/2018   moderate   Thrombocytopenia (HCC) 12/08/2022   Type 2 diabetes mellitus (HCC)     Current Medications: Current Meds  Medication Sig   allopurinol (ZYLOPRIM) 100 MG tablet Take 1 tablet (100 mg total) by mouth daily.   apixaban (ELIQUIS) 2.5 MG TABS tablet Take 1 tablet (2.5 mg total) by mouth 2 (two) times daily.   colchicine 0.6 MG tablet Take 1 tablet (0.6 mg total) by mouth daily. Please take as needed for three days for gout flare  Cardiology Office Note:    Date:  08/21/2023   ID:  Angel Gentry, [name] [birth year], MRN [id number]  PCP:  Angel Ponto, MD  Cardiologist:  Angel Herrlich, MD    Referring MD: Angel Ponto, MD    ASSESSMENT:    1. Chronic diastolic heart failure (HCC)   2. Chronic anticoagulation   3. Coronary artery disease involving native coronary artery of native heart without angina pectoris   4. Dyslipidemia   5. Hypertensive heart disease with heart failure (HCC)    PLAN:    In order of problems listed above:  She clearly has improved heart failure is compensated stable CAD without anginal discomfort rate controlled atrial fibrillation with her beta-blocker and tolerating reduced dose anticoagulant without recurrent thrombocytopenia or GI bleeding. No change in her medications lipids are at target Heart failure is compensated with her current loop diuretic weight at home is stable For safety recheck a CBC BMP proBNP   Next appointment: 6 months   Medication Adjustments/Labs and Tests Ordered: Current medicines are reviewed at length with the patient today.  Concerns regarding medicines are outlined above.  No orders of the defined types were placed in this encounter.  No orders of the defined types were placed in this encounter.    History of Present Illness:    Aerilynn Gabb is a 88 y.o. female with a hx of complex heart disease including CAD with remote CABG hypertensive heart disease or chronic diastolic heart failure chronic atrial fibrillation with anticoagulation hyperlipidemia thrombocytopenia with GI bleeding that is resolved last seen 07/17/2023.  At that visit a proBNP level was stable creatinine 0.99 GFR 56 cc/min.  Initiated on low-dose valsartan with the plan that if tolerated to initiate Entresto at follow-up visit continue her loop diuretic beta-blocker SGLT2 inhibitor and reduced dose anticoagulant.  Compliance with diet, lifestyle and  medications: Yes  Her husband is present participates in evaluation decision making Think she has finally recovered to her previous level of function is having no hypotension edema shortness of breath chest pain palpitation or syncope She is back on her anticoagulant no bleeding will recheck a platelet count today along with a CBC and renal function She tolerates her statin without muscle pain or weakness her recent labs and August had a cholesterol 173 LDL 67 Past Medical History:  Diagnosis Date   Breast cancer (HCC) 10/03/2021   CAD in native artery    Chronic atrial fibrillation (HCC) 10/03/2016   Chronic diastolic heart failure (HCC) 11/06/2016   Coronary artery disease involving native coronary artery of native heart without angina pectoris 07/28/2015   Diabetes mellitus due to underlying condition with unspecified complications (HCC) 07/28/2015   Dyslipidemia    Essential hypertension    GIB (gastrointestinal bleeding) 12/08/2022   Hx of CABG 10/03/2016   Overview:  2006   Hypertensive heart disease with heart failure (HCC) 07/28/2015   Hyponatremia with decreased serum osmolality 07/31/2017   Mitral regurgitation 02/10/2018   moderate   Thrombocytopenia (HCC) 12/08/2022   Type 2 diabetes mellitus (HCC)     Current Medications: Current Meds  Medication Sig   allopurinol (ZYLOPRIM) 100 MG tablet Take 1 tablet (100 mg total) by mouth daily.   apixaban (ELIQUIS) 2.5 MG TABS tablet Take 1 tablet (2.5 mg total) by mouth 2 (two) times daily.   colchicine 0.6 MG tablet Take 1 tablet (0.6 mg total) by mouth daily. Please take as needed for three days for gout flare

## 2023-08-21 ENCOUNTER — Ambulatory Visit: Payer: Medicare Other | Attending: Cardiology | Admitting: Cardiology

## 2023-08-21 ENCOUNTER — Encounter: Payer: Self-pay | Admitting: Cardiology

## 2023-08-21 VITALS — BP 118/76 | HR 89 | Ht 60.0 in | Wt 127.6 lb

## 2023-08-21 DIAGNOSIS — I110 Hypertensive heart disease with heart failure: Secondary | ICD-10-CM | POA: Diagnosis not present

## 2023-08-21 DIAGNOSIS — I2510 Atherosclerotic heart disease of native coronary artery without angina pectoris: Secondary | ICD-10-CM | POA: Diagnosis not present

## 2023-08-21 DIAGNOSIS — E785 Hyperlipidemia, unspecified: Secondary | ICD-10-CM | POA: Diagnosis not present

## 2023-08-21 DIAGNOSIS — I5032 Chronic diastolic (congestive) heart failure: Secondary | ICD-10-CM

## 2023-08-21 DIAGNOSIS — Z7901 Long term (current) use of anticoagulants: Secondary | ICD-10-CM | POA: Diagnosis not present

## 2023-08-21 MED ORDER — POTASSIUM CHLORIDE CRYS ER 20 MEQ PO TBCR: 20.0000 meq | EXTENDED_RELEASE_TABLET | Freq: Every day | ORAL | 3 refills | Status: AC

## 2023-08-21 NOTE — Patient Instructions (Signed)
Medication Instructions:  Your physician has recommended you make the following change in your medication:   START: Potasium Chloride 20 meq daily  *If you need a refill on your cardiac medications before your next appointment, please call your pharmacy*   Lab Work: Your physician recommends that you return for lab work in:   Labs today: CBC, BMP, Pro BNP  If you have labs (blood work) drawn today and your tests are completely normal, you will receive your results only by: MyChart Message (if you have MyChart) OR A paper copy in the mail If you have any lab test that is abnormal or we need to change your treatment, we will call you to review the results.   Testing/Procedures: None   Follow-Up: At [facility], you and your health needs are our priority.  As part of our continuing mission to provide you with exceptional heart care, we have created designated Provider Care Teams.  These Care Teams include your primary Cardiologist (physician) and Advanced Practice Providers (APPs -  Physician Assistants and Nurse Practitioners) who all work together to provide you with the care you need, when you need it.  We recommend signing up for the patient portal called "MyChart".  Sign up information is provided on this After Visit Summary.  MyChart is used to connect with patients for Virtual Visits (Telemedicine).  Patients are able to view lab/test results, encounter notes, upcoming appointments, etc.  Non-urgent messages can be sent to your provider as well.   To learn more about what you can do with MyChart, go to [email or web address].    Your next appointment:   3 month(s)  Provider:   Wallis Bamberg, NP [facility])    Other Instructions None

## 2023-08-22 ENCOUNTER — Telehealth: Payer: Self-pay

## 2023-08-22 LAB — CBC
Hematocrit: 42.5 % (ref 34.0–46.6)
Hemoglobin: 13.6 g/dL (ref 11.1–15.9)
MCH: 29 pg (ref 26.6–33.0)
MCHC: 32 g/dL (ref 31.5–35.7)
MCV: 91 fL (ref 79–97)
Platelets: 148 10*3/uL — ABNORMAL LOW (ref 150–450)
RBC: 4.69 x10E6/uL (ref 3.77–5.28)
RDW: 13.8 % (ref 11.7–15.4)
WBC: 6.6 10*3/uL (ref 3.4–10.8)

## 2023-08-22 LAB — BASIC METABOLIC PANEL
BUN/Creatinine Ratio: 29 — ABNORMAL HIGH (ref 12–28)
BUN: 33 mg/dL — ABNORMAL HIGH (ref 8–27)
CO2: 25 mmol/L (ref 20–29)
Calcium: 9.6 mg/dL (ref 8.7–10.3)
Chloride: 100 mmol/L (ref 96–106)
Creatinine, Ser: 1.15 mg/dL — ABNORMAL HIGH (ref 0.57–1.00)
Glucose: 130 mg/dL — ABNORMAL HIGH (ref 70–99)
Potassium: 3.7 mmol/L (ref 3.5–5.2)
Sodium: 140 mmol/L (ref 134–144)
eGFR: 46 mL/min/{1.73_m2} — ABNORMAL LOW (ref >59)

## 2023-08-22 LAB — PRO B NATRIURETIC PEPTIDE: NT-Pro BNP: 1211 pg/mL — ABNORMAL HIGH (ref 0–738)

## 2023-08-22 NOTE — Telephone Encounter (Signed)
-----   Message from [facility] sent at 08/22/2023  8:37 AM EDT ----- Normal or stable result No changes

## 2023-08-22 NOTE — Telephone Encounter (Signed)
Patient notified through my chart.

## 2023-10-03 NOTE — Progress Notes (Signed)
[facility]  [address] [place],  [place]  [address] [phone]  Clinic Day:  10/04/2023  Referring physician: Marylen Ponto, MD  HISTORY OF PRESENT ILLNESS:  The patient is an 88 y.o. female with stage IA (T1a N0 M0) hormone positive breast cancer, status post a lumpectomy in November 2014. This was followed by adjuvant breast radiation, which was completed in February 2015.  The patient also completed 5 years of anastrozole for her adjuvant endocrine therapy.  She comes in today for routine follow up.  Since her last visit, the patient has been doing well.  She continues to deny having any particular changes in her breasts which concern her for disease recurrence.  Of note, she has not undergone her annual mammogram for this calendar year.    PHYSICAL EXAM:  Blood pressure 138/80, pulse 89, temperature 97.9 F (36.6 C), resp. rate 14, height 5' (1.524 m), weight 127 lb (57.6 kg), SpO2 100%. Wt Readings from Last 3 Encounters:  10/04/23 127 lb (57.6 kg)  08/21/23 127 lb 9.6 oz (57.9 kg)  07/17/23 128 lb 3.2 oz (58.2 kg)   Body mass index is 24.8 kg/m. Performance status (ECOG): 2 Physical Exam Constitutional:      Appearance: Normal appearance.  HENT:     Mouth/Throat:     Pharynx: Oropharynx is clear. No oropharyngeal exudate.  Cardiovascular:     Rate and Rhythm: Normal rate and regular rhythm.     Heart sounds: No murmur heard.    No friction rub. No gallop.  Pulmonary:     Breath sounds: Normal breath sounds.  Chest:  Breasts:    Right: No swelling, bleeding, inverted nipple, mass, nipple discharge or skin change.     Left: No swelling, bleeding, inverted nipple, mass, nipple discharge or skin change.  Abdominal:     General: Bowel sounds are normal. There is no distension.     Palpations: Abdomen is soft. There is no mass.     Tenderness: There is no abdominal tenderness.  Musculoskeletal:        General: No tenderness.      Cervical back: Normal range of motion and neck supple.     Right lower leg: No edema.     Left lower leg: No edema.  Lymphadenopathy:     Cervical: No cervical adenopathy.     Right cervical: No superficial, deep or posterior cervical adenopathy.    Left cervical: No superficial, deep or posterior cervical adenopathy.     Upper Body:     Right upper body: No supraclavicular or axillary adenopathy.     Left upper body: No supraclavicular or axillary adenopathy.     Lower Body: No right inguinal adenopathy. No left inguinal adenopathy.  Skin:    Coloration: Skin is not jaundiced.     Findings: No lesion or rash.  Neurological:     General: No focal deficit present.     Mental Status: She is alert and oriented to person, place, and time. Mental status is at baseline.  Psychiatric:        Mood and Affect: Mood normal.        Behavior: Behavior normal.        Thought Content: Thought content normal.        Judgment: Judgment normal.    ASSESSMENT & PLAN:  Assessment/Plan:  An 88 y.o. female with stage IA hormone positive breast cancer, status post a lumpectomy in November 2014.  Based  upon her clinical breast exam today, the patient remains disease-free.  With respect to her radiographic breast cancer surveillance, I will ensure she gets her annual mammogram before the end of this month.  Otherwise, as she is doing well, I will see her back in another year for repeat clinical assessment.  The patient understands all the plans discussed today and is in agreement with them.    Mariamawit Depaoli Kirby Funk, MD

## 2023-10-04 ENCOUNTER — Inpatient Hospital Stay: Payer: Medicare Other | Attending: Oncology | Admitting: Oncology

## 2023-10-04 ENCOUNTER — Other Ambulatory Visit: Payer: Self-pay | Admitting: Oncology

## 2023-10-04 VITALS — BP 138/80 | HR 89 | Temp 97.9°F | Resp 14 | Ht 60.0 in | Wt 127.0 lb

## 2023-10-04 DIAGNOSIS — Z853 Personal history of malignant neoplasm of breast: Secondary | ICD-10-CM | POA: Insufficient documentation

## 2023-10-04 DIAGNOSIS — C50412 Malignant neoplasm of upper-outer quadrant of left female breast: Secondary | ICD-10-CM | POA: Diagnosis not present

## 2023-10-04 DIAGNOSIS — Z923 Personal history of irradiation: Secondary | ICD-10-CM | POA: Diagnosis not present

## 2023-10-04 DIAGNOSIS — Z170 Estrogen receptor positive status [ER+]: Secondary | ICD-10-CM

## 2023-10-04 DIAGNOSIS — Z1231 Encounter for screening mammogram for malignant neoplasm of breast: Secondary | ICD-10-CM

## 2023-11-22 ENCOUNTER — Ambulatory Visit: Payer: Medicare Other | Attending: Cardiology | Admitting: Cardiology

## 2023-11-22 ENCOUNTER — Encounter: Payer: Self-pay | Admitting: Cardiology

## 2023-11-22 VITALS — BP 110/72 | HR 84 | Ht 60.0 in | Wt 127.2 lb

## 2023-11-22 DIAGNOSIS — I2510 Atherosclerotic heart disease of native coronary artery without angina pectoris: Secondary | ICD-10-CM

## 2023-11-22 DIAGNOSIS — Z7901 Long term (current) use of anticoagulants: Secondary | ICD-10-CM | POA: Diagnosis not present

## 2023-11-22 DIAGNOSIS — I482 Chronic atrial fibrillation, unspecified: Secondary | ICD-10-CM

## 2023-11-22 DIAGNOSIS — I5032 Chronic diastolic (congestive) heart failure: Secondary | ICD-10-CM

## 2023-11-22 DIAGNOSIS — E1169 Type 2 diabetes mellitus with other specified complication: Secondary | ICD-10-CM

## 2023-11-22 NOTE — Progress Notes (Signed)
Cardiology Office Note:    Date:  11/22/2023   ID:  [name], [place] 1934-12-02, MRN [id number]  PCP:  Marylen Ponto, MD   [facility] Providers Cardiologist:  Norman Herrlich, MD     Referring MD: Marylen Ponto, MD    History of Present Illness:    Angel Gentry is a 89 y.o. female with a hx of CAD s/p CABG x 6 2017, chronic diastolic heart failure, hypertension, chronic atrial fibrillation not currently anticoagulated secondary to ITP and GI bleed, DM2, idiopathic thrombocytopenia, hyperlipidemia.  03/19/2023 echo EF 60 to 65%, moderate concentric LVH, RV systolic function moderately reduced, mildly elevated PASP, LA mild to moderately dilated, RA moderately dilated, mild MR, aortic regurgitation is mild, aortic sclerosis present without stenosis 05/25/2021 echo EF 55 to 60%, moderately elevated PASP, LA mildly dilated, RA severely dilated, mild MR, mild calcification of the aortic valve, mild AR. 2017 CABG  She was evaluated in the ED on 02/27/2023 for chest pain, EKG showed atrial fibrillation.  She apparently has ongoing chest tightness that worsens with exertion, she did not endorse any new chest pain however her family was concerned that her pedal edema appeared to be worse and wanted her to be evaluated.  Ultrasound was negative for DVT. Her potassium was 3.3, GFR 57, BNP 228, D-dimer 0.60, troponin 15> 16.  She was given 40 mg IV Lasix, 60 mEq oral potassium.  She was discharged with instructions to take 20 Lasix twice daily x 3 days and follow-up with her cardiologist.  Evaluated by our office on 03/02/2023 posthospitalization, she had been instructed to stop her Demadex and take Lasix however did not notice good diuresing with this.  We changed this back to Demadex twice daily.  We restarted her Entresto. Repeat echocardiogram was arranged and completed on 03/19/2023 which revealed an EF 60 to 65%, moderate concentric LVH, mildly elevated PASP,  biatrial enlargement, mild MR, mild AR.  Repeat lab work after diuresing indicated slight rise in her creatinine so we decreased her Demadex dose to 40 in a.m. and 20 in p.m.  She presents today companied by her husband for follow-up of her CAD, heart failure, atrial fibrillation.  She presents a log of her blood pressure, weight, heart rates, they are all excellently controlled.  We reviewed her medication list and it appears she is inadvertently taken valsartan and Entresto.  She has well from a cardiac perspective, she does not offer any formal complaints, says she has not been as active as she would like to be during the winter season, would like to exercise more. She denies chest pain, palpitations, dyspnea, pnd, orthopnea, n, v, dizziness, syncope, edema, weight gain, or early satiety.   Past Medical History:  Diagnosis Date   Breast cancer (HCC) 10/03/2021   CAD in native artery    Chronic atrial fibrillation (HCC) 10/03/2016   Chronic diastolic heart failure (HCC) 11/06/2016   Coronary artery disease involving native coronary artery of native heart without angina pectoris 07/28/2015   Diabetes mellitus due to underlying condition with unspecified complications (HCC) 07/28/2015   Dyslipidemia    Essential hypertension    GIB (gastrointestinal bleeding) 12/08/2022   Hx of CABG 10/03/2016   Overview:  2006   Hypertensive heart disease with heart failure (HCC) 07/28/2015   Hyponatremia with decreased serum osmolality 07/31/2017   Mitral regurgitation 02/10/2018   moderate   Thrombocytopenia (HCC) 12/08/2022   Type 2 diabetes mellitus (HCC)  Past Surgical History:  Procedure Laterality Date   CAROTID ENDARTERECTOMY     CATARACT EXTRACTION     CORONARY ARTERY BYPASS GRAFT      Current Medications: Current Meds  Medication Sig   allopurinol (ZYLOPRIM) 100 MG tablet Take 1 tablet (100 mg total) by mouth daily.   apixaban (ELIQUIS) 2.5 MG TABS tablet Take 1 tablet (2.5 mg total)  by mouth 2 (two) times daily.   colchicine 0.6 MG tablet Take 1 tablet (0.6 mg total) by mouth daily. Please take as needed for three days for gout flare up   famotidine (PEPCID) 20 MG tablet Take 1 tablet (20 mg total) by mouth daily. (Patient taking differently: Take 20 mg by mouth daily.)   JARDIANCE 10 MG TABS tablet Take 10 mg by mouth daily.   levothyroxine (SYNTHROID) 75 MCG tablet Take 75 mcg by mouth daily.    loratadine (CLARITIN) 10 MG tablet Take 10 mg by mouth daily.   metoprolol succinate (TOPROL-XL) 50 MG 24 hr tablet Take 50 mg by mouth daily.   Multiple Vitamin (MULTIVITAMIN) capsule Take 1 capsule by mouth daily.    nitroGLYCERIN (NITROSTAT) 0.4 MG SL tablet Place 1 tablet (0.4 mg total) under the tongue every 5 (five) minutes as needed for chest pain.   potassium chloride SA (KLOR-CON M) 20 MEQ tablet Take 1 tablet (20 mEq total) by mouth daily.   sacubitril-valsartan (ENTRESTO) 24-26 MG Take 1 tablet by mouth 2 (two) times daily.   simvastatin (ZOCOR) 10 MG tablet Take 10 mg by mouth daily.   torsemide (DEMADEX) 20 MG tablet Take 1 tablet (20 mg total) by mouth 2 (two) times daily. Only take Torsemide daily if patient's weight is 127 or less.   Vitamin D, Ergocalciferol, (DRISDOL) 50000 units CAPS capsule Take 50,000 Units by mouth every 14 (fourteen) days.   [DISCONTINUED] valsartan (DIOVAN) 40 MG tablet Take 1 tablet (40 mg total) by mouth 2 (two) times daily. (Take this medication once daily for 2 weeks, then twice daily)     Allergies:   Nabumetone and Sulfa antibiotics   Social History   Socioeconomic History   Marital status: Married    Spouse name: Not on file   Number of children: Not on file   Years of education: Not on file   Highest education level: Not on file  Occupational History   Not on file  Tobacco Use   Smoking status: Never    Passive exposure: Never   Smokeless tobacco: Never  Vaping Use   Vaping status: Never Used  Substance and Sexual  Activity   Alcohol use: Not Currently   Drug use: Never   Sexual activity: Not on file  Other Topics Concern   Not on file  Social History Narrative   Not on file   Social Drivers of Health   Financial Resource Strain: Medium Risk (12/19/2022)   Received from [facility], [facility]   Overall Financial Resource Strain (CARDIA)    Difficulty of Paying Living Expenses: Somewhat hard  Food Insecurity: No Food Insecurity (12/19/2022)   Received from [facility], [facility]   Hunger Vital Sign    Worried About Running Out of Food in the Last Year: Never true    Ran Out of Food in the Last Year: Never true  Transportation Needs: No Transportation Needs (12/19/2022)   Received from [facility], [facility]   PRAPARE - Transportation    Lack of Transportation (Medical): No    Lack  of Transportation (Non-Medical): No  Physical Activity: Insufficiently Active (10/03/2022)   Exercise Vital Sign    Days of Exercise per Week: 2 days    Minutes of Exercise per Session: 10 min  Stress: No Stress Concern Present (12/08/2022)   Received from [facility], [facility] - Occupational Stress Questionnaire    Feeling of Stress : Not at all  Social Connections: Unknown (12/08/2022)   Received from [facility], [facility]   Social Network    Social Network: Not on file     Family History: The patient's family history includes Cancer in her sister; Heart disease in her brother and mother.  ROS:   Please see the history of present illness.     All other systems reviewed and are negative.  EKGs/Labs/Other Studies Reviewed:    The following studies were reviewed today: Cardiac Studies & Procedures      ECHOCARDIOGRAM  ECHOCARDIOGRAM COMPLETE 03/19/2023  Narrative ECHOCARDIOGRAM REPORT    Patient Name:   SYNETTA GOGUE [facility] Date of Exam: 03/19/2023 Medical Rec #:  [id number]                    Height:       60.0  in Accession #:    [id number]                   Weight:       137.0 lb Date of Birth:  Dec 13, 1934                    BSA:          1.589 m Patient Age:    88 years                     BP:           130/80 mmHg Patient Gender: F                            HR:           91 bpm. Exam Location:  [place]  Procedure: 2D Echo, Cardiac Doppler, Color Doppler and Strain Analysis  Indications:    Chronic diastolic heart failure (HCC) [I50.32 (ICD-10-CM)]; SOB (shortness of breath) on exertion [R06.02 (ICD-10-CM)]  History:        Patient has prior history of Echocardiogram examinations, most recent 05/26/2021. CAD, Prior CABG, Mitral regurgitation, Arrythmias:Atrial Fibrillation, Signs/Symptoms:Edema and Chest Pain; Risk Factors:Diabetes, Dyslipidemia and Hypertension.  Sonographer:    Louie Boston RDCS Referring Phys: [id number] Flossie Dibble   Sonographer Comments: Global longitudinal strain was attempted. IMPRESSIONS   1. Left ventricular ejection fraction, by estimation, is 60 to 65%. The left ventricle has normal function. The left ventricle has no regional wall motion abnormalities. There is moderate concentric left ventricular hypertrophy. Left ventricular diastolic parameters are indeterminate. 2. Right ventricular systolic function is mildly reduced. The right ventricular size is normal. There is mildly elevated pulmonary artery systolic pressure. 3. Left atrial size was mild to moderately dilated. 4. Right atrial size was moderately dilated. 5. The mitral valve is normal in structure. Mild mitral valve regurgitation. No evidence of mitral stenosis. 6. Tricuspid valve regurgitation is moderate. 7. The aortic valve is tricuspid. Aortic valve regurgitation is mild. Aortic valve sclerosis is present, with no evidence of aortic valve stenosis. 8. Aortic Normal DTA. 9. The inferior vena cava  is dilated in size with >50% respiratory variability, suggesting right atrial pressure of 8  mmHg.  FINDINGS Left Ventricle: Left ventricular ejection fraction, by estimation, is 60 to 65%. The left ventricle has normal function. The left ventricle has no regional wall motion abnormalities. The left ventricular internal cavity size was normal in size. There is moderate concentric left ventricular hypertrophy. Left ventricular diastolic parameters are indeterminate.  Right Ventricle: The right ventricular size is normal. No increase in right ventricular wall thickness. Right ventricular systolic function is mildly reduced. There is mildly elevated pulmonary artery systolic pressure. The tricuspid regurgitant velocity is 2.89 m/s, and with an assumed right atrial pressure of 8 mmHg, the estimated right ventricular systolic pressure is 41.4 mmHg.  Left Atrium: Left atrial size was mild to moderately dilated.  Right Atrium: Right atrial size was moderately dilated.  Pericardium: There is no evidence of pericardial effusion.  Mitral Valve: The mitral valve is normal in structure. Mild mitral annular calcification. Mild mitral valve regurgitation. No evidence of mitral valve stenosis.  Tricuspid Valve: The tricuspid valve is normal in structure. Tricuspid valve regurgitation is moderate . No evidence of tricuspid stenosis.  Aortic Valve: The aortic valve is tricuspid. Aortic valve regurgitation is mild. Aortic valve sclerosis is present, with no evidence of aortic valve stenosis.  Pulmonic Valve: The pulmonic valve was normal in structure. Pulmonic valve regurgitation is not visualized. No evidence of pulmonic stenosis.  Aorta: Normal DTA, the aortic arch was not well visualized and the aortic root and ascending aorta are structurally normal, with no evidence of dilitation.  Venous: The inferior vena cava is dilated in size with greater than 50% respiratory variability, suggesting right atrial pressure of 8 mmHg.  IAS/Shunts: No atrial level shunt detected by color flow  Doppler.   LEFT VENTRICLE PLAX 2D LVIDd:         3.30 cm   Diastology LVIDs:         2.90 cm   LV e' medial:    8.59 cm/s LV PW:         1.35 cm   LV E/e' medial:  10.2 LV IVS:        1.40 cm   LV e' lateral:   11.90 cm/s LVOT diam:     2.00 cm   LV E/e' lateral: 7.4 LV SV:         34 LV SV Index:   21 LVOT Area:     3.14 cm   RIGHT VENTRICLE            IVC RV Basal diam:  3.90 cm    IVC diam: 2.10 cm RV S prime:     9.79 cm/s TAPSE (M-mode): 1.5 cm  LEFT ATRIUM             Index        RIGHT ATRIUM           Index LA diam:        4.40 cm 2.77 cm/m   RA Area:     23.60 cm LA Vol (A2C):   65.2 ml 41.02 ml/m  RA Volume:   75.50 ml  47.50 ml/m LA Vol (A4C):   59.6 ml 37.50 ml/m LA Biplane Vol: 63.8 ml 40.14 ml/m AORTIC VALVE LVOT Vmax:   57.10 cm/s LVOT Vmean:  37.200 cm/s LVOT VTI:    0.108 m  AORTA Ao Root diam: 2.85 cm Ao Asc diam:  3.50 cm Ao Desc diam: 2.10 cm  MV E velocity: 88.00 cm/s  TRICUSPID VALVE MV A velocity: 43.50 cm/s  TR Peak grad:   33.4 mmHg MV E/A ratio:  2.02        TR Vmax:        289.00 cm/s  SHUNTS Systemic VTI:  0.11 m Systemic Diam: 2.00 cm  Norman Herrlich MD Electronically signed by Norman Herrlich MD Signature Date/Time: 03/19/2023/3:27:46 PM    Final              EKG:  EKG is not ordered today.    Recent Labs: 02/27/2023: B Natriuretic Peptide 228.6 08/21/2023: BUN 33; Creatinine, Ser 1.15; Hemoglobin 13.6; NT-Pro BNP 1,211; Platelets 148; Potassium 3.7; Sodium 140  Recent Lipid Panel    Component Value Date/Time   CHOL 139 05/28/2020 0835   TRIG 155 (H) 05/28/2020 0835   HDL 45 05/28/2020 0835   CHOLHDL 3.1 05/28/2020 0835   LDLCALC 67 05/28/2020 0835     Risk Assessment/Calculations:    CHA2DS2-VASc Score = 7   This indicates a 11.2% annual risk of stroke. The patient's score is based upon: CHF History: 1 HTN History: 1 Diabetes History: 1 Stroke History: 0 Vascular Disease History: 1 Age Score: 2 Gender  Score: 1               Physical Exam:    VS:  BP 110/72 (BP Location: Right Arm, Patient Position: Sitting)   Pulse 84   Ht 5' (1.524 m)   Wt 127 lb 3.2 oz (57.7 kg)   SpO2 97%   BMI 24.84 kg/m     Wt Readings from Last 3 Encounters:  11/22/23 127 lb 3.2 oz (57.7 kg)  10/04/23 127 lb (57.6 kg)  08/21/23 127 lb 9.6 oz (57.9 kg)     GEN:  Well nourished, well developed in no acute distress HEENT: Normal NECK: No JVD; No carotid bruits LYMPHATICS: No lymphadenopathy CARDIAC: irregularly irregular, no murmurs, rubs, gallops RESPIRATORY: no rales, no wheezing or rhonchi  ABDOMEN: Soft, non-tender, non-distended MUSCULOSKELETAL: trace pedal edema,  No deformity  SKIN: Warm and dry NEUROLOGIC:  Alert and oriented x 3 PSYCHIATRIC:  Normal affect   ASSESSMENT:    1. Chronic diastolic heart failure (HCC)   2. Coronary artery disease involving native coronary artery of native heart without angina pectoris   3. Type 2 diabetes mellitus with other specified complication, without long-term current use of insulin (HCC)   4. Chronic anticoagulation   5. Chronic atrial fibrillation (HCC)     PLAN:    In order of problems listed above:  Chronic diastolic heart failure - most recent EF 60-65%, NYHA class I today.  Continue metoprolol 50 mg daily, continue Entresto 24-26 twice daily, continue Jardiance 10 mg daily.  She was inadvertently taking valsartan-as she had was advised to stop this.  Will have her continue to keep a blood pressure log-as she will notify us if her systolics are persistently around 140, we could increase her Entresto if needed.  Will repeat BMET.  Chronic AF/hypercoagulable state/ITP -CHA2DS2-VASc score of 7.  HR 84 bpm, rate controlled atrial fibrillation.  She is on low-dose Eliquis 2.5 mg twice daily--tolerating without hematochezia hematuria hemoptysis--dose reduction based on age and weight.  History of GI bleed in idiopathic thrombocytopenia, was followed  by hematology however now they have advised her she can follow-up with her PCP.  Will repeat CBC, BMET  CAD - s/p CABG x 6 2017. Stable with no anginal symptoms. No indication for  ischemic evaluation.  Continue Zocor 10 mg daily.  Dyslipidemia-most recent LDL was 67, continue Zocor 10 mg daily.    DM2 -since we are doing other lab work we will check her A1c today in forward all of her results to her PCP which she plans to see in approximately a week.  Disposition-CBC, BMET, DM 2, stop valsartan, follow-up in 6 months.          Medication Adjustments/Labs and Tests Ordered: Current medicines are reviewed at length with the patient today.  Concerns regarding medicines are outlined above.  Orders Placed This Encounter  Procedures   Basic metabolic panel   CBC with Differential/Platelet   Hemoglobin A1c   No orders of the defined types were placed in this encounter.   Patient Instructions  Medication Instructions:  Your physician has recommended you make the following change in your medication:  Stop Valsartan  *If you need a refill on your cardiac medications before your next appointment, please call your pharmacy*   Lab Work: Your physician recommends that you return for lab work in: Today for BMP, CBC and A1C  If you have labs (blood work) drawn today and your tests are completely normal, you will receive your results only by: MyChart Message (if you have MyChart) OR A paper copy in the mail If you have any lab test that is abnormal or we need to change your treatment, we will call you to review the results.   Testing/Procedures: NONE   Follow-Up: At [facility], you and your health needs are our priority.  As part of our continuing mission to provide you with exceptional heart care, we have created designated Provider Care Teams.  These Care Teams include your primary Cardiologist (physician) and Advanced Practice Providers (APPs -  Physician Assistants and  Nurse Practitioners) who all work together to provide you with the care you need, when you need it.  We recommend signing up for the patient portal called "MyChart".  Sign up information is provided on this After Visit Summary.  MyChart is used to connect with patients for Virtual Visits (Telemedicine).  Patients are able to view lab/test results, encounter notes, upcoming appointments, etc.  Non-urgent messages can be sent to your provider as well.   To learn more about what you can do with MyChart, go to [email or web address].    Your next appointment:   6 month(s)  Provider:   Norman Herrlich, MD    Other Instructions If BP is consistently elevated, 140 or above please notify our office         Signed, Flossie Dibble, NP  11/22/2023 12:59 PM    [facility]

## 2023-11-22 NOTE — Patient Instructions (Signed)
Medication Instructions:  Your physician has recommended you make the following change in your medication:  Stop Valsartan  *If you need a refill on your cardiac medications before your next appointment, please call your pharmacy*   Lab Work: Your physician recommends that you return for lab work in: Today for BMP, CBC and A1C  If you have labs (blood work) drawn today and your tests are completely normal, you will receive your results only by: MyChart Message (if you have MyChart) OR A paper copy in the mail If you have any lab test that is abnormal or we need to change your treatment, we will call you to review the results.   Testing/Procedures: NONE   Follow-Up: At [facility], you and your health needs are our priority.  As part of our continuing mission to provide you with exceptional heart care, we have created designated Provider Care Teams.  These Care Teams include your primary Cardiologist (physician) and Advanced Practice Providers (APPs -  Physician Assistants and Nurse Practitioners) who all work together to provide you with the care you need, when you need it.  We recommend signing up for the patient portal called "MyChart".  Sign up information is provided on this After Visit Summary.  MyChart is used to connect with patients for Virtual Visits (Telemedicine).  Patients are able to view lab/test results, encounter notes, upcoming appointments, etc.  Non-urgent messages can be sent to your provider as well.   To learn more about what you can do with MyChart, go to [email or web address].    Your next appointment:   6 month(s)  Provider:   Norman Herrlich, MD    Other Instructions If BP is consistently elevated, 140 or above please notify our office

## 2023-11-23 ENCOUNTER — Other Ambulatory Visit: Payer: Self-pay

## 2023-11-23 DIAGNOSIS — I110 Hypertensive heart disease with heart failure: Secondary | ICD-10-CM

## 2023-11-23 LAB — CBC WITH DIFFERENTIAL/PLATELET
Basophils Absolute: 0.1 x10E3/uL (ref 0.0–0.2)
Basos: 1 %
EOS (ABSOLUTE): 0.2 x10E3/uL (ref 0.0–0.4)
Eos: 3 %
Hematocrit: 41.3 % (ref 34.0–46.6)
Hemoglobin: 13.5 g/dL (ref 11.1–15.9)
Immature Grans (Abs): 0 x10E3/uL (ref 0.0–0.1)
Immature Granulocytes: 0 %
Lymphocytes Absolute: 1.8 x10E3/uL (ref 0.7–3.1)
Lymphs: 27 %
MCH: 30.3 pg (ref 26.6–33.0)
MCHC: 32.7 g/dL (ref 31.5–35.7)
MCV: 93 fL (ref 79–97)
Monocytes Absolute: 0.4 x10E3/uL (ref 0.1–0.9)
Monocytes: 6 %
Neutrophils Absolute: 4.1 x10E3/uL (ref 1.4–7.0)
Neutrophils: 63 %
Platelets: 158 x10E3/uL (ref 150–450)
RBC: 4.46 x10E6/uL (ref 3.77–5.28)
RDW: 15 % (ref 11.7–15.4)
WBC: 6.6 x10E3/uL (ref 3.4–10.8)

## 2023-11-23 LAB — BASIC METABOLIC PANEL WITH GFR
BUN/Creatinine Ratio: 22 (ref 12–28)
BUN: 26 mg/dL (ref 8–27)
CO2: 25 mmol/L (ref 20–29)
Calcium: 9.7 mg/dL (ref 8.7–10.3)
Chloride: 101 mmol/L (ref 96–106)
Creatinine, Ser: 1.16 mg/dL — ABNORMAL HIGH (ref 0.57–1.00)
Glucose: 144 mg/dL — ABNORMAL HIGH (ref 70–99)
Potassium: 4.3 mmol/L (ref 3.5–5.2)
Sodium: 142 mmol/L (ref 134–144)
eGFR: 46 mL/min/1.73 — ABNORMAL LOW

## 2023-11-23 LAB — HEMOGLOBIN A1C
Est. average glucose Bld gHb Est-mCnc: 186 mg/dL
Hgb A1c MFr Bld: 8.1 % — ABNORMAL HIGH (ref 4.8–5.6)

## 2023-11-23 MED ORDER — ENTRESTO 24-26 MG PO TABS: 1.0000 | ORAL_TABLET | Freq: Two times a day (BID) | ORAL | 1 refills | Status: DC

## 2023-11-23 NOTE — Telephone Encounter (Signed)
Spoke to patient to clarify medications. Rx sent to pharmacy.

## 2023-12-01 ENCOUNTER — Other Ambulatory Visit: Payer: Self-pay | Admitting: Cardiology

## 2023-12-10 DIAGNOSIS — Z1231 Encounter for screening mammogram for malignant neoplasm of breast: Secondary | ICD-10-CM | POA: Diagnosis not present

## 2023-12-10 LAB — HM MAMMOGRAPHY

## 2023-12-11 DIAGNOSIS — Z803 Family history of malignant neoplasm of breast: Secondary | ICD-10-CM | POA: Diagnosis not present

## 2023-12-11 DIAGNOSIS — I110 Hypertensive heart disease with heart failure: Secondary | ICD-10-CM | POA: Diagnosis not present

## 2023-12-11 DIAGNOSIS — Z1231 Encounter for screening mammogram for malignant neoplasm of breast: Secondary | ICD-10-CM | POA: Diagnosis not present

## 2023-12-12 DIAGNOSIS — E119 Type 2 diabetes mellitus without complications: Secondary | ICD-10-CM | POA: Diagnosis not present

## 2023-12-12 DIAGNOSIS — E039 Hypothyroidism, unspecified: Secondary | ICD-10-CM | POA: Diagnosis not present

## 2023-12-12 DIAGNOSIS — Z79899 Other long term (current) drug therapy: Secondary | ICD-10-CM | POA: Diagnosis not present

## 2023-12-12 DIAGNOSIS — I10 Essential (primary) hypertension: Secondary | ICD-10-CM | POA: Diagnosis not present

## 2023-12-12 DIAGNOSIS — Z6824 Body mass index (BMI) 24.0-24.9, adult: Secondary | ICD-10-CM | POA: Diagnosis not present

## 2023-12-12 DIAGNOSIS — M109 Gout, unspecified: Secondary | ICD-10-CM | POA: Diagnosis not present

## 2023-12-12 LAB — BASIC METABOLIC PANEL WITH GFR
BUN/Creatinine Ratio: 21 (ref 12–28)
BUN: 25 mg/dL (ref 8–27)
CO2: 25 mmol/L (ref 20–29)
Calcium: 9 mg/dL (ref 8.7–10.3)
Chloride: 98 mmol/L (ref 96–106)
Creatinine, Ser: 1.17 mg/dL — ABNORMAL HIGH (ref 0.57–1.00)
Glucose: 189 mg/dL — ABNORMAL HIGH (ref 70–99)
Potassium: 4.3 mmol/L (ref 3.5–5.2)
Sodium: 140 mmol/L (ref 134–144)
eGFR: 45 mL/min/1.73 — ABNORMAL LOW

## 2023-12-14 ENCOUNTER — Other Ambulatory Visit: Payer: Self-pay | Admitting: Cardiology

## 2023-12-21 ENCOUNTER — Encounter: Payer: Self-pay | Admitting: Oncology

## 2024-01-01 ENCOUNTER — Other Ambulatory Visit: Payer: Self-pay | Admitting: Cardiology

## 2024-01-11 ENCOUNTER — Other Ambulatory Visit: Payer: Self-pay

## 2024-01-11 MED ORDER — APIXABAN 2.5 MG PO TABS: 2.5000 mg | ORAL_TABLET | Freq: Two times a day (BID) | ORAL | 3 refills | Status: DC

## 2024-01-11 NOTE — Telephone Encounter (Signed)
 Prescription refill request for Eliquis received. Indication:afib Last office visit:1/25 Scr:1.17  2/25 Age: 89 Weight:57.7  kg  Prescription refilled

## 2024-01-21 ENCOUNTER — Other Ambulatory Visit: Payer: Self-pay

## 2024-01-21 MED ORDER — ENTRESTO 24-26 MG PO TABS: 1.0000 | ORAL_TABLET | Freq: Two times a day (BID) | ORAL | 2 refills | Status: AC

## 2024-03-27 DIAGNOSIS — E7800 Pure hypercholesterolemia, unspecified: Secondary | ICD-10-CM | POA: Diagnosis not present

## 2024-03-27 DIAGNOSIS — E119 Type 2 diabetes mellitus without complications: Secondary | ICD-10-CM | POA: Diagnosis not present

## 2024-03-27 DIAGNOSIS — Z79899 Other long term (current) drug therapy: Secondary | ICD-10-CM | POA: Diagnosis not present

## 2024-03-27 DIAGNOSIS — E039 Hypothyroidism, unspecified: Secondary | ICD-10-CM | POA: Diagnosis not present

## 2024-03-31 DIAGNOSIS — E7800 Pure hypercholesterolemia, unspecified: Secondary | ICD-10-CM | POA: Diagnosis not present

## 2024-03-31 DIAGNOSIS — M109 Gout, unspecified: Secondary | ICD-10-CM | POA: Diagnosis not present

## 2024-03-31 DIAGNOSIS — Z6824 Body mass index (BMI) 24.0-24.9, adult: Secondary | ICD-10-CM | POA: Diagnosis not present

## 2024-03-31 DIAGNOSIS — E1165 Type 2 diabetes mellitus with hyperglycemia: Secondary | ICD-10-CM | POA: Diagnosis not present

## 2024-04-14 DIAGNOSIS — J069 Acute upper respiratory infection, unspecified: Secondary | ICD-10-CM | POA: Diagnosis not present

## 2024-05-20 ENCOUNTER — Ambulatory Visit

## 2024-05-20 ENCOUNTER — Ambulatory Visit: Admitting: Cardiology

## 2024-05-20 VITALS — BP 112/68 | HR 84 | Ht 60.0 in | Wt 128.2 lb

## 2024-05-20 DIAGNOSIS — I10 Essential (primary) hypertension: Secondary | ICD-10-CM | POA: Insufficient documentation

## 2024-05-20 DIAGNOSIS — I361 Nonrheumatic tricuspid (valve) insufficiency: Secondary | ICD-10-CM | POA: Insufficient documentation

## 2024-05-20 DIAGNOSIS — I5032 Chronic diastolic (congestive) heart failure: Secondary | ICD-10-CM | POA: Diagnosis not present

## 2024-05-20 DIAGNOSIS — I482 Chronic atrial fibrillation, unspecified: Secondary | ICD-10-CM | POA: Insufficient documentation

## 2024-05-20 DIAGNOSIS — I340 Nonrheumatic mitral (valve) insufficiency: Secondary | ICD-10-CM | POA: Diagnosis not present

## 2024-05-20 DIAGNOSIS — I071 Rheumatic tricuspid insufficiency: Secondary | ICD-10-CM

## 2024-05-20 DIAGNOSIS — I351 Nonrheumatic aortic (valve) insufficiency: Secondary | ICD-10-CM | POA: Insufficient documentation

## 2024-05-20 HISTORY — DX: Nonrheumatic aortic (valve) insufficiency: I35.1

## 2024-05-20 HISTORY — DX: Rheumatic tricuspid insufficiency: I07.1

## 2024-05-20 MED ORDER — APIXABAN 2.5 MG PO TABS: 2.5000 mg | ORAL_TABLET | Freq: Two times a day (BID) | ORAL | 3 refills | Status: AC

## 2024-05-20 NOTE — Assessment & Plan Note (Signed)
 Mild MR reported on echocardiogram from April 2024. Follow-up repeat echocardiogram tentatively around April to May next year.

## 2024-05-20 NOTE — Progress Notes (Signed)
 Cardiology Consultation:    Date:  05/20/2024   ID:  Angel Gentry, Angel Gentry 09/08/1935, MRN [id number]  PCP:  Angel Marcellus RAMAN, MD  Cardiologist:  Angel SAUNDERS Pamla Pangle, MD   Referring MD: Angel Marcellus RAMAN, MD   Chief Complaint  Patient presents with   Follow-up   Medication Refill    Eliquis       ASSESSMENT AND PLAN:   Angel Gentry 89 year old woman with history of  CAD s/p CABG in 2006, chronic diastolic heart failure, hypertension, chronic atrial fibrillation was initially not on anticoagulation in the setting of GI bleed and history of ITP, later started on low-dose Eliquis , diabetes mellitus, hyperlipidemia, CKD stage III, mild MR, mild aortic insufficiency, moderate TR, RVSP 41 mmHg on last echocardiogram May 2024 with preserved LVEF 60 to 65% with mildly reduced RV function and moderate concentric left ventricular hypertrophy.  Here for follow-up visit.  Problem List Items Addressed This Visit     Chronic diastolic heart failure (HCC)   Chronic diastolic CHF with last echocardiogram May 2024 with LVEF 60 to 65%, mildly reduced RV function, moderate concentric left ventricular hypertrophy, mild MR, mild aortic insufficiency, moderate TR and RVSP 41 mmHg. Appears euvolemic and compensated today. Weight at home steady around 128 to 129 pounds. Remains on torsemide  20 mg twice daily Low-dose potassium chloride  supplementation 20 mEq once daily.  From guideline-directed medical therapy standpoint on metoprolol succinate 50 mg once daily Entresto  24 mg - 26 mg twice daily Jardiance [empagliflozin] 10 mg once daily. Tolerating well no side effects.  Advised to continue with low-salt diet.      Relevant Medications   apixaban  (ELIQUIS ) 2.5 MG TABS tablet   Chronic atrial fibrillation (HCC)   Chronic atrial fibrillation, distal would consider permanent A-fib. Rate controlled. Asymptomatic. Continue with metoprolol succinate 50 mg daily. With elevated CHA2DS2-VASc  score, on anticoagulation with low-dose Eliquis  2.5 mg twice daily considering age and weight. Tolerating well and no recent bleeding concerns.      Relevant Medications   apixaban  (ELIQUIS ) 2.5 MG TABS tablet   Mitral regurgitation   Mild MR reported on echocardiogram from April 2024. Follow-up repeat echocardiogram tentatively around April to May next year.      Relevant Medications   apixaban  (ELIQUIS ) 2.5 MG TABS tablet   Essential hypertension - Primary   Well-controlled. Continue current guideline directed medical therapy for cardiomyopathy which also for her hypertension.       Relevant Medications   apixaban  (ELIQUIS ) 2.5 MG TABS tablet   Other Relevant Orders   EKG 12-Lead (Completed)   Mild aortic insufficiency   Anticipate follow-up echocardiogram around April to May next year.       Relevant Medications   apixaban  (ELIQUIS ) 2.5 MG TABS tablet   Tricuspid regurgitation   Moderate TR associated with mildly reduced RV function and estimated RVSP 41 mmHg on echocardiogram May 2024. Doing well from heart failure standpoint at this time. Will reassess on repeat echocardiogram being planned for mitral and aortic valve assessment next year.       Relevant Medications   apixaban  (ELIQUIS ) 2.5 MG TABS tablet   Return to clinic tentatively in 6 months  History of Present Illness:    Angel Gentry is a 89 y.o. female who is being seen today for follow-up visit. PCP is Angel Marcellus RAMAN, MD.  Accompanied by her husband who also has his appointment today with me.  History of CAD s/p CABG in 2006, chronic  diastolic heart failure, hypertension, chronic atrial fibrillation was initially not on anticoagulation in the setting of GI bleed and history of ITP, later started on low-dose Eliquis , diabetes mellitus, hyperlipidemia, CKD stage III, mild MR, mild aortic insufficiency, moderate TR, RVSP 41 mmHg on last echocardiogram May 2024 with preserved LVEF 60 to 65%  with mildly reduced RV function and moderate concentric left ventricular hypertrophy.  Mentions overall doing well.  At home does her household chores.  Sleeps most of the day rest of the time and deals with insomnia at night. No regular exercise. Denies any symptoms of chest pain, shortness of breath, orthopnea No palpitation, lightheadedness, dizziness or syncopal episodes. No blood in urine or stools.  Weight at home consistently around 128 and 120 9 pounds. Has been taking torsemide  20 mg twice daily and has low-dose potassium chloride  supplementation 20 mEq a day.  Good compliance with her medications. Home log of blood pressure readings reviewed well-controlled systolic 110s to 879d.  EKG in the clinic today shows atrial fibrillation with ventricular rate controlled 84/min, QRS duration 84 ms with nonspecific ST-T changes in inferolateral leads.  No significant change in comparison to prior EKG from February 27, 2023. Past Medical History:  Diagnosis Date   Breast cancer (HCC) 10/03/2021   CAD in native artery    Chronic atrial fibrillation (HCC) 10/03/2016   Chronic diastolic heart failure (HCC) 11/06/2016   Coronary artery disease involving native coronary artery of native heart without angina pectoris 07/28/2015   Diabetes mellitus due to underlying condition with unspecified complications (HCC) 07/28/2015   Dyslipidemia    Essential hypertension    GIB (gastrointestinal bleeding) 12/08/2022   Hx of CABG 10/03/2016   Overview:  2006   Hypertensive heart disease with heart failure (HCC) 07/28/2015   Hyponatremia with decreased serum osmolality 07/31/2017   Mitral regurgitation 02/10/2018   moderate   Thrombocytopenia (HCC) 12/08/2022   Type 2 diabetes mellitus (HCC)     Past Surgical History:  Procedure Laterality Date   CAROTID ENDARTERECTOMY     CATARACT EXTRACTION     CORONARY ARTERY BYPASS GRAFT      Current Medications: Current Meds  Medication Sig    allopurinol  (ZYLOPRIM ) 100 MG tablet Take 1 tablet (100 mg total) by mouth daily.   colchicine  0.6 MG tablet Take 1 tablet (0.6 mg total) by mouth daily. Please take as needed for three days for gout flare up   famotidine  (PEPCID ) 20 MG tablet Take 1 tablet (20 mg total) by mouth daily.   JARDIANCE 10 MG TABS tablet Take 10 mg by mouth daily.   levothyroxine (SYNTHROID) 75 MCG tablet Take 75 mcg by mouth daily.    metoprolol succinate (TOPROL-XL) 50 MG 24 hr tablet Take 50 mg by mouth daily.   Multiple Vitamin (MULTIVITAMIN) capsule Take 1 capsule by mouth daily.    nitroGLYCERIN  (NITROSTAT ) 0.4 MG SL tablet Place 1 tablet (0.4 mg total) under the tongue every 5 (five) minutes as needed for chest pain.   potassium chloride  SA (KLOR-CON  M) 20 MEQ tablet Take 1 tablet (20 mEq total) by mouth daily.   sacubitril -valsartan  (ENTRESTO ) 24-26 MG Take 1 tablet by mouth 2 (two) times daily.   simvastatin (ZOCOR) 10 MG tablet Take 10 mg by mouth daily.   torsemide  (DEMADEX ) 20 MG tablet Take 1 tablet (20 mg total) by mouth 2 (two) times daily. Only take Torsemide  daily if patient's weight is 127 or less.   Vitamin D, Ergocalciferol, (DRISDOL) 50000 units CAPS  capsule Take 50,000 Units by mouth every 14 (fourteen) days.   [DISCONTINUED] apixaban  (ELIQUIS ) 2.5 MG TABS tablet Take 1 tablet (2.5 mg total) by mouth 2 (two) times daily.     Allergies:   Nabumetone and Sulfa antibiotics   Social History   Socioeconomic History   Marital status: Married    Spouse name: Not on file   Number of children: Not on file   Years of education: Not on file   Highest education level: Not on file  Occupational History   Not on file  Tobacco Use   Smoking status: Never    Passive exposure: Never   Smokeless tobacco: Never  Vaping Use   Vaping status: Never Used  Substance and Sexual Activity   Alcohol use: Not Currently   Drug use: Never   Sexual activity: Not on file  Other Topics Concern   Not on file   Social History Narrative   Not on file   Social Drivers of Health   Financial Resource Strain: Medium Risk (12/19/2022)   Received from [facility]   Overall Financial Resource Strain (CARDIA)    Difficulty of Paying Living Expenses: Somewhat hard  Food Insecurity: No Food Insecurity (12/19/2022)   Received from [facility]   Hunger Vital Sign    Within the past 12 months, you worried that your food would run out before you got the money to buy more.: Never true    Within the past 12 months, the food you bought just didn't last and you didn't have money to get more.: Never true  Transportation Needs: No Transportation Needs (12/19/2022)   Received from [facility] - Transportation    Lack of Transportation (Medical): No    Lack of Transportation (Non-Medical): No  Physical Activity: Insufficiently Active (10/03/2022)   Exercise Vital Sign    Days of Exercise per Week: 2 days    Minutes of Exercise per Session: 10 min  Stress: No Stress Concern Present (12/08/2022)   Received from [facility] - Occupational Stress Questionnaire    Feeling of Stress : Not at all  Social Connections: Unknown (12/08/2022)   Received from [facility]   Social Network    Social Network: Not on file     Family History: The patient's family history includes Cancer in her sister; Heart disease in her brother and mother. ROS:   Please see the history of present illness.    All 14 point review of systems negative except as described per history of present illness.  EKGs/Labs/Other Studies Reviewed:    The following studies were reviewed today:   EKG:  EKG Interpretation Date/Time:  Tuesday May 20 2024 11:11:02 EDT Ventricular Rate:  84 PR Interval:    QRS Duration:  84 QT Interval:  356 QTC Calculation: 420 R Axis:   19  Text Interpretation: Atrial fibrillation Septal infarct , age undetermined ST & T wave abnormality, consider  inferolateral ischemia Abnormal ECG When compared with ECG of 27-Feb-2023 12:03, PREVIOUS ECG IS PRESENT Confirmed by Liborio Hai reddy [phone]) on 05/20/2024 11:51:01 AM    Recent Labs: 08/21/2023: NT-Pro BNP 1,211 11/22/2023: Hemoglobin 13.5; Platelets 158 12/11/2023: BUN 25; Creatinine, Ser 1.17; Potassium 4.3; Sodium 140  Recent Lipid Panel    Component Value Date/Time   CHOL 139 05/28/2020 0835   TRIG 155 (H) 05/28/2020 0835   HDL 45 05/28/2020 0835   CHOLHDL 3.1 05/28/2020 0835   LDLCALC 67  05/28/2020 0835    Physical Exam:    VS:  BP 112/68 (BP Location: Left Arm, Patient Position: Sitting)   Pulse 84   Ht 5' (1.524 m)   Wt 128 lb 3.2 oz (58.2 kg)   SpO2 96%   BMI 25.04 kg/m     Wt Readings from Last 3 Encounters:  05/20/24 128 lb 3.2 oz (58.2 kg)  11/22/23 127 lb 3.2 oz (57.7 kg)  10/04/23 127 lb (57.6 kg)     GENERAL:  Well nourished, well developed in no acute distress NECK: No JVD; No carotid bruits CARDIAC: Irregularly irregular, S1 and S2 present, no murmurs, no rubs, no gallops CHEST:  Clear to auscultation without rales, wheezing or rhonchi  Extremities: Trace bilateral pitting ankle edema. Pulses bilaterally symmetric with radial 2+ and dorsalis pedis 2+ NEUROLOGIC:  Alert and oriented x 3  Medication Adjustments/Labs and Tests Ordered: Current medicines are reviewed at length with the patient today.  Concerns regarding medicines are outlined above.  Orders Placed This Encounter  Procedures   EKG 12-Lead   Meds ordered this encounter  Medications   apixaban  (ELIQUIS ) 2.5 MG TABS tablet    Sig: Take 1 tablet (2.5 mg total) by mouth 2 (two) times daily.    Dispense:  180 tablet    Refill:  3    Signed, Kalysta Kneisley reddy Vetta Couzens, MD, MPH, [facility]. 05/20/2024 12:47 PM    [facility]

## 2024-05-20 NOTE — Assessment & Plan Note (Signed)
 Moderate TR associated with mildly reduced RV function and estimated RVSP 41 mmHg on echocardiogram May 2024. Doing well from heart failure standpoint at this time. Will reassess on repeat echocardiogram being planned for mitral and aortic valve assessment next year.

## 2024-05-20 NOTE — Assessment & Plan Note (Signed)
 Anticipate follow-up echocardiogram around April to May next year.

## 2024-05-20 NOTE — Patient Instructions (Signed)

## 2024-05-20 NOTE — Assessment & Plan Note (Signed)
 Well-controlled. Continue current guideline directed medical therapy for cardiomyopathy which also for her hypertension.

## 2024-05-20 NOTE — Assessment & Plan Note (Signed)
 Chronic atrial fibrillation, distal would consider permanent A-fib. Rate controlled. Asymptomatic. Continue with metoprolol succinate 50 mg daily. With elevated CHA2DS2-VASc score, on anticoagulation with low-dose Eliquis  2.5 mg twice daily considering age and weight. Tolerating well and no recent bleeding concerns.

## 2024-05-20 NOTE — Assessment & Plan Note (Signed)
 Chronic diastolic CHF with last echocardiogram May 2024 with LVEF 60 to 65%, mildly reduced RV function, moderate concentric left ventricular hypertrophy, mild MR, mild aortic insufficiency, moderate TR and RVSP 41 mmHg. Appears euvolemic and compensated today. Weight at home steady around 128 to 129 pounds. Remains on torsemide  20 mg twice daily Low-dose potassium chloride  supplementation 20 mEq once daily.  From guideline-directed medical therapy standpoint on metoprolol succinate 50 mg once daily Entresto  24 mg - 26 mg twice daily Jardiance [empagliflozin] 10 mg once daily. Tolerating well no side effects.  Advised to continue with low-salt diet.

## 2024-07-15 DIAGNOSIS — R300 Dysuria: Secondary | ICD-10-CM | POA: Diagnosis not present

## 2024-07-17 DIAGNOSIS — Z0000 Encounter for general adult medical examination without abnormal findings: Secondary | ICD-10-CM | POA: Diagnosis not present

## 2024-07-17 DIAGNOSIS — E1122 Type 2 diabetes mellitus with diabetic chronic kidney disease: Secondary | ICD-10-CM | POA: Diagnosis not present

## 2024-07-17 DIAGNOSIS — E039 Hypothyroidism, unspecified: Secondary | ICD-10-CM | POA: Diagnosis not present

## 2024-07-17 DIAGNOSIS — M109 Gout, unspecified: Secondary | ICD-10-CM | POA: Diagnosis not present

## 2024-07-17 DIAGNOSIS — E7800 Pure hypercholesterolemia, unspecified: Secondary | ICD-10-CM | POA: Diagnosis not present

## 2024-07-17 DIAGNOSIS — Z23 Encounter for immunization: Secondary | ICD-10-CM | POA: Diagnosis not present

## 2024-07-17 DIAGNOSIS — R300 Dysuria: Secondary | ICD-10-CM | POA: Diagnosis not present

## 2024-07-17 LAB — LAB REPORT - SCANNED
A1c: 7.8
EGFR: 50

## 2024-09-23 ENCOUNTER — Other Ambulatory Visit: Payer: Self-pay

## 2024-09-23 MED ORDER — TORSEMIDE 20 MG PO TABS: 20.0000 mg | ORAL_TABLET | Freq: Two times a day (BID) | ORAL | 2 refills | Status: AC

## 2024-10-02 NOTE — Progress Notes (Signed)
 [facility]  [address] [place],  [place]  [address] [phone]  Clinic Day:  10/03/2024  Referring physician: Ina Marcellus RAMAN, MD  HISTORY OF PRESENT ILLNESS:  The patient is an 89 y.o. female with stage IA (T1a N0 M0) hormone positive breast cancer, status post a lumpectomy in November 2014. This was followed by adjuvant breast radiation, which was completed in February 2015.  The patient also completed 5 years of anastrozole for her adjuvant endocrine therapy.  She comes in today for routine follow up.  Since her last visit, the patient has been doing well.  She continues to deny having any particular changes in her breasts which concern her for disease recurrence.  Her annual mammogram in February 2025 showed no evidence of disease recurrence.  PHYSICAL EXAM:  Blood pressure (!) 159/66, pulse 87, temperature 97.9 F (36.6 C), temperature source Oral, resp. rate 14, height 5' (1.524 m), weight 135 lb (61.2 kg), SpO2 97%. Wt Readings from Last 3 Encounters:  10/03/24 135 lb (61.2 kg)  05/20/24 128 lb 3.2 oz (58.2 kg)  11/22/23 127 lb 3.2 oz (57.7 kg)   Body mass index is 26.37 kg/m. Performance status (ECOG): 2 Physical Exam Constitutional:      Appearance: Normal appearance.  HENT:     Mouth/Throat:     Pharynx: Oropharynx is clear. No oropharyngeal exudate.  Cardiovascular:     Rate and Rhythm: Normal rate and regular rhythm.     Heart sounds: No murmur heard.    No friction rub. No gallop.  Pulmonary:     Breath sounds: Normal breath sounds.  Chest:  Breasts:    Right: No swelling, bleeding, inverted nipple, mass, nipple discharge or skin change.     Left: No swelling, bleeding, inverted nipple, mass, nipple discharge or skin change.  Abdominal:     General: Bowel sounds are normal. There is no distension.     Palpations: Abdomen is soft. There is no mass.     Tenderness: There is no abdominal tenderness.  Musculoskeletal:         General: No tenderness.     Cervical back: Normal range of motion and neck supple.     Right lower leg: No edema.     Left lower leg: No edema.  Lymphadenopathy:     Cervical: No cervical adenopathy.     Right cervical: No superficial, deep or posterior cervical adenopathy.    Left cervical: No superficial, deep or posterior cervical adenopathy.     Upper Body:     Right upper body: No supraclavicular or axillary adenopathy.     Left upper body: No supraclavicular or axillary adenopathy.     Lower Body: No right inguinal adenopathy. No left inguinal adenopathy.  Skin:    Coloration: Skin is not jaundiced.     Findings: No lesion or rash.  Neurological:     General: No focal deficit present.     Mental Status: She is alert and oriented to person, place, and time. Mental status is at baseline.  Psychiatric:        Mood and Affect: Mood normal.        Behavior: Behavior normal.        Thought Content: Thought content normal.        Judgment: Judgment normal.    ASSESSMENT & PLAN:  Assessment/Plan:  An 89 y.o. female with stage IA hormone positive breast cancer, status post a lumpectomy in November 2014.  Based upon her clinical breast exam today, the patient remains disease-free.  With respect to her radiographic breast cancer surveillance, I will ensure she gets her annual mammogram early next year.   Otherwise, as she is doing well, I will see her back in another year for repeat clinical assessment.  The patient understands all the plans discussed today and is in agreement with them.    Dhani Imel DELENA Kerns, MD

## 2024-10-03 ENCOUNTER — Inpatient Hospital Stay: Payer: Medicare Other | Attending: Oncology | Admitting: Oncology

## 2024-10-03 ENCOUNTER — Other Ambulatory Visit: Payer: Self-pay | Admitting: Oncology

## 2024-10-03 VITALS — BP 159/66 | HR 87 | Temp 97.9°F | Resp 14 | Ht 60.0 in | Wt 135.0 lb

## 2024-10-03 DIAGNOSIS — Z853 Personal history of malignant neoplasm of breast: Secondary | ICD-10-CM | POA: Insufficient documentation

## 2024-10-03 DIAGNOSIS — Z923 Personal history of irradiation: Secondary | ICD-10-CM | POA: Diagnosis not present

## 2024-10-03 DIAGNOSIS — C50312 Malignant neoplasm of lower-inner quadrant of left female breast: Secondary | ICD-10-CM | POA: Diagnosis not present

## 2024-10-03 DIAGNOSIS — Z170 Estrogen receptor positive status [ER+]: Secondary | ICD-10-CM | POA: Diagnosis not present

## 2024-10-03 DIAGNOSIS — C50412 Malignant neoplasm of upper-outer quadrant of left female breast: Secondary | ICD-10-CM

## 2024-10-03 DIAGNOSIS — Z1231 Encounter for screening mammogram for malignant neoplasm of breast: Secondary | ICD-10-CM

## 2024-11-12 ENCOUNTER — Other Ambulatory Visit: Payer: Self-pay

## 2024-11-12 MED ORDER — SACUBITRIL-VALSARTAN 24-26 MG PO TABS: 1.0000 | ORAL_TABLET | Freq: Two times a day (BID) | ORAL | 2 refills | Status: AC

## 2024-11-15 NOTE — Progress Notes (Unsigned)
 " Cardiology Office Note:    Date:  11/17/2024   ID:  Angel Gentry, Angel Gentry [birth year], MRN [id number]  PCP:  Angel Marcellus RAMAN, MD  Cardiologist:  Angel Leiter, MD    Referring MD: Angel Marcellus RAMAN, MD    ASSESSMENT:    1. Hypertensive heart disease with heart failure (HCC)   2. Chronic diastolic heart failure (HCC)   3. Chronic atrial fibrillation (HCC)   4. Chronic anticoagulation   5. Nonrheumatic mitral valve regurgitation   6. Mild aortic insufficiency   7. Dyslipidemia   8. Stage 3a chronic kidney disease (HCC)    PLAN:    In order of problems listed above:  Angel Gentry with heart failure weights at home very little very little 130 to 131 pounds no edema stable symptoms and is on maximal medical therapy including beta-blocker low-dose Entresto  intolerant of higher doses along with her loop diuretic.  Continue the same check labs today BMP proBNP Stable atrial fibrillation rate controlled and continue reduced dose anticoagulant with age and body mass Stable valvular heart disease not severe Continue her statin with CAD status post CABG lipids at target Stable improved kidney function   Next appointment: 6 months   Medication Adjustments/Labs and Tests Ordered: Current medicines are reviewed at length with the patient today.  Concerns regarding medicines are outlined above.  Orders Placed This Encounter  Procedures   Basic metabolic panel with GFR   Pro b natriuretic peptide (BNP)   Meds ordered this encounter  Medications   nitroGLYCERIN  (NITROSTAT ) 0.4 MG SL tablet    Sig: Place 1 tablet (0.4 mg total) under the tongue every 5 (five) minutes as needed for chest pain.    Dispense:  25 tablet    Refill:  3     History of Present Illness:    Angel Gentry is a 89 y.o. female with a hx of complex heart disease including CAD with remote CABG hypertensive heart disease with chronic diastolic heart failure chronic atrial  fibrillation with reduced dose anticoagulant hyperlipidemia thrombocytopenia GI bleeding that is resolved last seen 08/21/2023.  Her echocardiogram May 2024 showed moderate LVH normal ejection fraction 60 to 65% mild RV dysfunction with mildly elevated pulmonary artery systolic pressure mild to moderate left atrial enlargement moderate right atrial enlargement mild mitral regurgitation mild aortic regurgitation.  Recent labs with her primary care physician 12/11/2023 showed creatinine 1.17 GFR 45 cc/min potassium 4.3 hemoglobin 13.5.  She had most recent labs September hemoglobin 12.9 A1c 7.8 TSH and free T4 were normal creatinine 1.1 GFR 50 cc sodium 139 potassium 4.1 LDL 67 cholesterol 159  Compliance with diet, lifestyle and medications: Yes  Her husband is present participates in evaluation and decision making Overall she has stable symptoms of heart failure short of breath when she climbs stairs but does not have to stop and rest no orthopnea edema chest pain palpitation or syncope She has had no bleeding with her Eliquis  and no muscle pain or weakness with her statin` Past Medical History:  Diagnosis Date   Breast cancer (HCC) 10/03/2021   CAD in native artery    Chronic atrial fibrillation (HCC) 10/03/2016   Chronic diastolic heart failure (HCC) 11/06/2016   Coronary artery disease involving native coronary artery of native heart without angina pectoris 07/28/2015   Diabetes mellitus due to underlying condition with unspecified complications (HCC) 07/28/2015   Dyslipidemia    Essential hypertension    GIB (gastrointestinal bleeding) 12/08/2022  Hx of CABG 10/03/2016   Overview:  2006   Hypertensive heart disease with heart failure (HCC) 07/28/2015   Hyponatremia with decreased serum osmolality 07/31/2017   Mild aortic insufficiency 05/20/2024   Mitral regurgitation 02/10/2018   moderate   Thrombocytopenia 12/08/2022   Tricuspid regurgitation 05/20/2024   Type 2 diabetes  mellitus (HCC)     Current Medications: Active Medications[1]    EKGs/Labs/Other Studies Reviewed:    The following studies were reviewed today:  Cardiac Studies & Procedures   ______________________________________________________________________________________________     ECHOCARDIOGRAM  ECHOCARDIOGRAM COMPLETE 03/19/2023  Narrative ECHOCARDIOGRAM REPORT    Patient Name:   Angel Gentry [facility] Date of Exam: 03/19/2023 Medical Rec #:  [id number]                    Height:       60.0 in Accession #:    [id number]                   Weight:       137.0 lb Date of Birth:  22-Oct-1935                    BSA:          1.589 m Patient Age:    89 years                     BP:           130/80 mmHg Patient Gender: F                            HR:           91 bpm. Exam Location:  [facility]  Procedure: 2D Echo, Cardiac Doppler, Color Doppler and Strain Analysis  Indications:    Chronic diastolic heart failure (HCC) [I50.32 (ICD-10-CM)]; SOB (shortness of breath) on exertion [R06.02 (ICD-10-CM)]  History:        Patient has prior history of Echocardiogram examinations, most recent 05/26/2021. CAD, Prior CABG, Mitral regurgitation, Arrythmias:Atrial Fibrillation, Signs/Symptoms:Edema and Chest Pain; Risk Factors:Diabetes, Dyslipidemia and Hypertension.  Sonographer:    Angel Gentry RDCS Referring Phys: [phone] Angel Gentry   Sonographer Comments: Global longitudinal strain was attempted. IMPRESSIONS   1. Left ventricular ejection fraction, by estimation, is 60 to 65%. The left ventricle has normal function. The left ventricle has no regional wall motion abnormalities. There is moderate concentric left ventricular hypertrophy. Left ventricular diastolic parameters are indeterminate. 2. Right ventricular systolic function is mildly reduced. The right ventricular size is normal. There is mildly elevated pulmonary artery systolic pressure. 3. Left atrial size was mild to  moderately dilated. 4. Right atrial size was moderately dilated. 5. The mitral valve is normal in structure. Mild mitral valve regurgitation. No evidence of mitral stenosis. 6. Tricuspid valve regurgitation is moderate. 7. The aortic valve is tricuspid. Aortic valve regurgitation is mild. Aortic valve sclerosis is present, with no evidence of aortic valve stenosis. 8. Aortic Normal DTA. 9. The inferior vena cava is dilated in size with >50% respiratory variability, suggesting right atrial pressure of 8 mmHg.  FINDINGS Left Ventricle: Left ventricular ejection fraction, by estimation, is 60 to 65%. The left ventricle has normal function. The left ventricle has no regional wall motion abnormalities. The left ventricular internal cavity size was normal in size. There is moderate concentric left ventricular hypertrophy. Left ventricular diastolic parameters are indeterminate.  Right  Ventricle: The right ventricular size is normal. No increase in right ventricular wall thickness. Right ventricular systolic function is mildly reduced. There is mildly elevated pulmonary artery systolic pressure. The tricuspid regurgitant velocity is 2.89 m/s, and with an assumed right atrial pressure of 8 mmHg, the estimated right ventricular systolic pressure is 41.4 mmHg.  Left Atrium: Left atrial size was mild to moderately dilated.  Right Atrium: Right atrial size was moderately dilated.  Pericardium: There is no evidence of pericardial effusion.  Mitral Valve: The mitral valve is normal in structure. Mild mitral annular calcification. Mild mitral valve regurgitation. No evidence of mitral valve stenosis.  Tricuspid Valve: The tricuspid valve is normal in structure. Tricuspid valve regurgitation is moderate . No evidence of tricuspid stenosis.  Aortic Valve: The aortic valve is tricuspid. Aortic valve regurgitation is mild. Aortic valve sclerosis is present, with no evidence of aortic valve  stenosis.  Pulmonic Valve: The pulmonic valve was normal in structure. Pulmonic valve regurgitation is not visualized. No evidence of pulmonic stenosis.  Aorta: Normal DTA, the aortic arch was not Gentry visualized and the aortic root and ascending aorta are structurally normal, with no evidence of dilitation.  Venous: The inferior vena cava is dilated in size with greater than 50% respiratory variability, suggesting right atrial pressure of 8 mmHg.  IAS/Shunts: No atrial level shunt detected by color flow Doppler.   LEFT VENTRICLE PLAX 2D LVIDd:         3.30 cm   Diastology LVIDs:         2.90 cm   LV e' medial:    8.59 cm/s LV PW:         1.35 cm   LV E/e' medial:  10.2 LV IVS:        1.40 cm   LV e' lateral:   11.90 cm/s LVOT diam:     2.00 cm   LV E/e' lateral: 7.4 LV SV:         34 LV SV Index:   21 LVOT Area:     3.14 cm   RIGHT VENTRICLE            IVC RV Basal diam:  3.90 cm    IVC diam: 2.10 cm RV S prime:     9.79 cm/s TAPSE (M-mode): 1.5 cm  LEFT ATRIUM             Index        RIGHT ATRIUM           Index LA diam:        4.40 cm 2.77 cm/m   RA Area:     23.60 cm LA Vol (A2C):   65.2 ml 41.02 ml/m  RA Volume:   75.50 ml  47.50 ml/m LA Vol (A4C):   59.6 ml 37.50 ml/m LA Biplane Vol: 63.8 ml 40.14 ml/m AORTIC VALVE LVOT Vmax:   57.10 cm/s LVOT Vmean:  37.200 cm/s LVOT VTI:    0.108 m  AORTA Ao Root diam: 2.85 cm Ao Asc diam:  3.50 cm Ao Desc diam: 2.10 cm  MV E velocity: 88.00 cm/s  TRICUSPID VALVE MV A velocity: 43.50 cm/s  TR Peak grad:   33.4 mmHg MV E/A ratio:  2.02        TR Vmax:        289.00 cm/s  SHUNTS Systemic VTI:  0.11 m Systemic Diam: 2.00 cm  Angel Leiter MD Electronically signed by Angel Leiter MD Signature Date/Time: 03/19/2023/3:27:46 PM  Final          ______________________________________________________________________________________________          Recent Labs: 11/22/2023: Hemoglobin 13.5; Platelets  158 12/11/2023: BUN 25; Creatinine, Ser 1.17; Potassium 4.3; Sodium 140  Recent Lipid Panel    Component Value Date/Time   CHOL 139 05/28/2020 0835   TRIG 155 (H) 05/28/2020 0835   HDL 45 05/28/2020 0835   CHOLHDL 3.1 05/28/2020 0835   LDLCALC 67 05/28/2020 0835    Physical Exam:    VS:  BP 124/82   Pulse 82   Ht 5' (1.524 m)   Wt 131 lb 6 oz (59.6 kg)   SpO2 99%   BMI 25.66 kg/m     Wt Readings from Last 3 Encounters:  11/17/24 131 lb 6 oz (59.6 kg)  10/03/24 135 lb (61.2 kg)  05/20/24 128 lb 3.2 oz (58.2 kg)     GEN: Appears her age Gentry nourished, Gentry developed in no acute distress HEENT: Normal NECK: No JVD; No carotid bruits LYMPHATICS: No lymphadenopathy CARDIAC: Irregular rate and rhythm variable first heart sound  RESPIRATORY:  Clear to auscultation without rales, wheezing or rhonchi  ABDOMEN: Soft, non-tender, non-distended MUSCULOSKELETAL:  No edema; No deformity  SKIN: Warm and dry NEUROLOGIC:  Alert and oriented x 3 PSYCHIATRIC:  Normal affect    Signed, Angel Leiter, MD  11/17/2024 9:35 AM    [facility]      [1]  Current Meds  Medication Sig   allopurinol  (ZYLOPRIM ) 100 MG tablet Take 1 tablet (100 mg total) by mouth daily.   apixaban  (ELIQUIS ) 2.5 MG TABS tablet Take 1 tablet (2.5 mg total) by mouth 2 (two) times daily.   colchicine  0.6 MG tablet Take 0.6 mg by mouth as needed (Gout flare ups).   famotidine  (PEPCID ) 10 MG tablet Take 10 mg by mouth 2 (two) times daily.   levothyroxine (SYNTHROID) 75 MCG tablet Take 75 mcg by mouth daily.    loratadine (CLARITIN) 10 MG tablet Take 10 mg by mouth daily.   metoprolol succinate (TOPROL-XL) 50 MG 24 hr tablet Take 50 mg by mouth daily.   Multiple Vitamin (MULTIVITAMIN) capsule Take 1 capsule by mouth daily.    potassium chloride  SA (KLOR-CON  M) 20 MEQ tablet Take 1 tablet (20 mEq total) by mouth daily.   sacubitril -valsartan  (ENTRESTO ) 24-26 MG Take 1 tablet by mouth 2 (two)  times daily.   simvastatin (ZOCOR) 10 MG tablet Take 10 mg by mouth daily.   torsemide  (DEMADEX ) 20 MG tablet Take 1 tablet (20 mg total) by mouth 2 (two) times daily. Only take Torsemide  daily if patient's weight is 127 or less.   Vitamin D, Ergocalciferol, (DRISDOL) 50000 units CAPS capsule Take 50,000 Units by mouth every 14 (fourteen) days.   [DISCONTINUED] colchicine  0.6 MG tablet Take 1 tablet (0.6 mg total) by mouth daily. Please take as needed for three days for gout flare up   [DISCONTINUED] nitroGLYCERIN  (NITROSTAT ) 0.4 MG SL tablet Place 1 tablet (0.4 mg total) under the tongue every 5 (five) minutes as needed for chest pain.   "

## 2024-11-17 ENCOUNTER — Encounter: Payer: Self-pay | Admitting: Cardiology

## 2024-11-17 ENCOUNTER — Ambulatory Visit: Attending: Cardiology | Admitting: Cardiology

## 2024-11-17 VITALS — BP 124/82 | HR 82 | Ht 60.0 in | Wt 131.4 lb

## 2024-11-17 DIAGNOSIS — E785 Hyperlipidemia, unspecified: Secondary | ICD-10-CM | POA: Diagnosis not present

## 2024-11-17 DIAGNOSIS — I482 Chronic atrial fibrillation, unspecified: Secondary | ICD-10-CM | POA: Insufficient documentation

## 2024-11-17 DIAGNOSIS — N1831 Chronic kidney disease, stage 3a: Secondary | ICD-10-CM | POA: Insufficient documentation

## 2024-11-17 DIAGNOSIS — I340 Nonrheumatic mitral (valve) insufficiency: Secondary | ICD-10-CM | POA: Insufficient documentation

## 2024-11-17 DIAGNOSIS — I351 Nonrheumatic aortic (valve) insufficiency: Secondary | ICD-10-CM | POA: Insufficient documentation

## 2024-11-17 DIAGNOSIS — I110 Hypertensive heart disease with heart failure: Secondary | ICD-10-CM | POA: Insufficient documentation

## 2024-11-17 DIAGNOSIS — I5032 Chronic diastolic (congestive) heart failure: Secondary | ICD-10-CM | POA: Insufficient documentation

## 2024-11-17 DIAGNOSIS — Z7901 Long term (current) use of anticoagulants: Secondary | ICD-10-CM | POA: Diagnosis present

## 2024-11-17 MED ORDER — NITROGLYCERIN 0.4 MG SL SUBL: 0.4000 mg | SUBLINGUAL_TABLET | SUBLINGUAL | 3 refills | Status: AC | PRN

## 2024-11-17 NOTE — Patient Instructions (Signed)
 Medication Instructions:  Your physician recommends that you continue on your current medications as directed. Please refer to the Current Medication list given to you today.  *If you need a refill on your cardiac medications before your next appointment, please call your pharmacy*   Lab Work: Your physician recommends that you have a BMP and ProBNP today in the office.  If you have labs (blood work) drawn today and your tests are completely normal, you will receive your results only by: MyChart Message (if you have MyChart) OR A paper copy in the mail If you have any lab test that is abnormal or we need to change your treatment, we will call you to review the results.   Testing/Procedures: None ordered   Follow-Up: At [facility], you and your health needs are our priority.  As part of our continuing mission to provide you with exceptional heart care, we have created designated Provider Care Teams.  These Care Teams include your primary Cardiologist (physician) and Advanced Practice Providers (APPs -  Physician Assistants and Nurse Practitioners) who all work together to provide you with the care you need, when you need it.  We recommend signing up for the patient portal called MyChart.  Sign up information is provided on this After Visit Summary.  MyChart is used to connect with patients for Virtual Visits (Telemedicine).  Patients are able to view lab/test results, encounter notes, upcoming appointments, etc.  Non-urgent messages can be sent to your provider as well.   To learn more about what you can do with MyChart, go to [email or web address].    Your next appointment:   6 month(s)  The format for your next appointment:   In Person  Provider:   Redell Leiter, MD    Other Instructions none  Important Information About Sugar

## 2024-11-18 ENCOUNTER — Ambulatory Visit: Payer: Self-pay | Admitting: Cardiology

## 2024-11-18 LAB — BASIC METABOLIC PANEL WITH GFR
BUN/Creatinine Ratio: 30 — ABNORMAL HIGH (ref 12–28)
BUN: 31 mg/dL — ABNORMAL HIGH (ref 8–27)
CO2: 26 mmol/L (ref 20–29)
Calcium: 9.6 mg/dL (ref 8.7–10.3)
Chloride: 99 mmol/L (ref 96–106)
Creatinine, Ser: 1.04 mg/dL — ABNORMAL HIGH (ref 0.57–1.00)
Glucose: 147 mg/dL — ABNORMAL HIGH (ref 70–99)
Potassium: 4.3 mmol/L (ref 3.5–5.2)
Sodium: 137 mmol/L (ref 134–144)
eGFR: 52 mL/min/1.73 — ABNORMAL LOW

## 2024-11-18 LAB — PRO B NATRIURETIC PEPTIDE: NT-Pro BNP: 1336 pg/mL — ABNORMAL HIGH (ref 0–738)

## 2024-12-04 ENCOUNTER — Inpatient Hospital Stay (HOSPITAL_BASED_OUTPATIENT_CLINIC_OR_DEPARTMENT_OTHER): Admission: RE | Admit: 2024-12-04 | Source: Ambulatory Visit | Admitting: Radiology

## 2024-12-11 ENCOUNTER — Ambulatory Visit (HOSPITAL_BASED_OUTPATIENT_CLINIC_OR_DEPARTMENT_OTHER): Admitting: Radiology

## 2025-10-05 ENCOUNTER — Inpatient Hospital Stay: Admitting: Oncology
